# Patient Record
Sex: Female | Born: 1935 | Race: Black or African American | Hispanic: No | Smoking: Never smoker
Health system: Southern US, Community
[De-identification: ages and names within clinical notes are randomized; demographics above are authoritative.]

## PROBLEM LIST (undated history)

## (undated) DIAGNOSIS — I10 Essential (primary) hypertension: Secondary | ICD-10-CM

## (undated) DIAGNOSIS — K922 Gastrointestinal hemorrhage, unspecified: Secondary | ICD-10-CM

## (undated) DIAGNOSIS — G479 Sleep disorder, unspecified: Secondary | ICD-10-CM

## (undated) DIAGNOSIS — M25561 Pain in right knee: Secondary | ICD-10-CM

## (undated) DIAGNOSIS — M539 Dorsopathy, unspecified: Secondary | ICD-10-CM

## (undated) DIAGNOSIS — M25562 Pain in left knee: Secondary | ICD-10-CM

## (undated) DIAGNOSIS — Z87440 Personal history of urinary (tract) infections: Secondary | ICD-10-CM

## (undated) DIAGNOSIS — R519 Headache, unspecified: Secondary | ICD-10-CM

## (undated) DIAGNOSIS — R32 Unspecified urinary incontinence: Secondary | ICD-10-CM

## (undated) DIAGNOSIS — E669 Obesity, unspecified: Secondary | ICD-10-CM

## (undated) DIAGNOSIS — K5900 Constipation, unspecified: Secondary | ICD-10-CM

## (undated) DIAGNOSIS — D649 Anemia, unspecified: Secondary | ICD-10-CM

## (undated) DIAGNOSIS — M1990 Unspecified osteoarthritis, unspecified site: Secondary | ICD-10-CM

## (undated) DIAGNOSIS — G5600 Carpal tunnel syndrome, unspecified upper limb: Secondary | ICD-10-CM

## (undated) DIAGNOSIS — R270 Ataxia, unspecified: Secondary | ICD-10-CM

## (undated) DIAGNOSIS — A419 Sepsis, unspecified organism: Secondary | ICD-10-CM

## (undated) DIAGNOSIS — C50919 Malignant neoplasm of unspecified site of unspecified female breast: Secondary | ICD-10-CM

## (undated) DIAGNOSIS — R51 Headache: Secondary | ICD-10-CM

## (undated) DIAGNOSIS — K639 Disease of intestine, unspecified: Secondary | ICD-10-CM

## (undated) DIAGNOSIS — R0600 Dyspnea, unspecified: Secondary | ICD-10-CM

## (undated) HISTORY — PX: COLON SURGERY: SHX602

## (undated) HISTORY — DX: Gastrointestinal hemorrhage, unspecified: K92.2

## (undated) HISTORY — DX: Anemia, unspecified: D64.9

## (undated) HISTORY — DX: Dorsopathy, unspecified: M53.9

## (undated) HISTORY — DX: Headache: R51

## (undated) HISTORY — DX: Malignant neoplasm of unspecified site of unspecified female breast: C50.919

## (undated) HISTORY — DX: Sleep disorder, unspecified: G47.9

## (undated) HISTORY — DX: Pain in right knee: M25.561

## (undated) HISTORY — DX: Unspecified osteoarthritis, unspecified site: M19.90

## (undated) HISTORY — DX: Disease of intestine, unspecified: K63.9

## (undated) HISTORY — DX: Carpal tunnel syndrome, unspecified upper limb: G56.00

## (undated) HISTORY — PX: BREAST SURGERY: SHX581

## (undated) HISTORY — DX: Pain in right knee: M25.562

## (undated) HISTORY — PX: OTHER SURGICAL HISTORY: SHX169

## (undated) HISTORY — DX: Ataxia, unspecified: R27.0

## (undated) HISTORY — DX: Essential (primary) hypertension: I10

## (undated) HISTORY — DX: Obesity, unspecified: E66.9

## (undated) HISTORY — PX: EYE SURGERY: SHX253

## (undated) HISTORY — DX: Sepsis, unspecified organism: A41.9

## (undated) HISTORY — DX: Headache, unspecified: R51.9

---

## 1998-11-29 DIAGNOSIS — Z9289 Personal history of other medical treatment: Secondary | ICD-10-CM

## 1998-11-29 HISTORY — DX: Personal history of other medical treatment: Z92.89

## 2005-12-14 ENCOUNTER — Ambulatory Visit (HOSPITAL_BASED_OUTPATIENT_CLINIC_OR_DEPARTMENT_OTHER): Admission: RE | Admit: 2005-12-14 | Discharge: 2005-12-14 | Payer: Self-pay | Admitting: *Deleted

## 2005-12-19 ENCOUNTER — Ambulatory Visit: Payer: Self-pay | Admitting: Internal Medicine

## 2008-08-19 ENCOUNTER — Ambulatory Visit: Payer: Self-pay | Admitting: Oncology

## 2008-10-04 ENCOUNTER — Ambulatory Visit: Payer: Self-pay | Admitting: Oncology

## 2008-10-07 LAB — CBC WITH DIFFERENTIAL/PLATELET
BASO%: 0.4 % (ref 0.0–2.0)
Basophils Absolute: 0 10*3/uL (ref 0.0–0.1)
EOS%: 2.8 % (ref 0.0–7.0)
HGB: 11.5 g/dL — ABNORMAL LOW (ref 11.6–15.9)
MCH: 25.5 pg — ABNORMAL LOW (ref 26.0–34.0)
MCHC: 33 g/dL (ref 32.0–36.0)
MCV: 77.1 fL — ABNORMAL LOW (ref 81.0–101.0)
MONO%: 7.2 % (ref 0.0–13.0)
RDW: 16.4 % — ABNORMAL HIGH (ref 11.3–14.5)
lymph#: 1.5 10*3/uL (ref 0.9–3.3)

## 2008-10-07 LAB — COMPREHENSIVE METABOLIC PANEL
ALT: 21 U/L (ref 0–35)
AST: 28 U/L (ref 0–37)
Albumin: 3.7 g/dL (ref 3.5–5.2)
Alkaline Phosphatase: 75 U/L (ref 39–117)
BUN: 21 mg/dL (ref 6–23)
Calcium: 9.3 mg/dL (ref 8.4–10.5)
Chloride: 109 mEq/L (ref 96–112)
Creatinine, Ser: 1.19 mg/dL (ref 0.40–1.20)
Potassium: 4.6 mEq/L (ref 3.5–5.3)

## 2011-04-16 NOTE — Procedures (Signed)
NAMEDELVA, DERDEN                 ACCOUNT NO.:  [id number]   MEDICAL RECORD NO.:  [id number]          PATIENT TYPE:  OUT   LOCATION:  SLEEP CENTER                 FACILITY:  [facility]   PHYSICIAN:  Clinton D. Maple Hudson, M.D. DATE OF BIRTH:  1932-10-18   DATE OF STUDY:  12/14/2004                              NOCTURNAL POLYSOMNOGRAM   REFERRING PHYSICIAN:  Dr. Nelta Numbers.   INDICATIONS FOR STUDY:  Insomnia with sleep apnea. Sleep related movement  disorder.   EPWORTH SLEEPINESS SCORE:  06/24, BMI 50, Weight 302 pounds.   HOME MEDICATIONS:  Atenolol, Benicar, Piroxicam, sertraline, iron.   SLEEP ARCHITECTURE:  Total sleep time 348 minutes with sleep efficiency 82%.  Stage I was 11%, stage II 69%, stages III and IV were absent, REM 20% of  total sleep time. Sleep latency 36 minutes, REM latency 141 minutes, awake  after sleep onset 42 minutes, arousal index4. No bedtime medication taken.   RESPIRATORY DATA:  Apnea hypopnea index (AHI, RDI) 0.5 per hour which is  within normal limits (0 to 5 per hour) have reflected only 3 hypopneas. She  slept supine and on both sides.   OXYGEN DATA:  Mild to moderate snoring through the study with oxygen  desaturation to a nadir of 86%. Mean oxygen saturation through the study was  96% on room air.   CARDIAC DATA:  Sinus rhythm with occasional PACs.   MOVEMENT/ PARASOMNIA:  Of total of 105 limb jerks were recorded of which 4  were associated with arousal or awakening for a periodic limb movement with  arousal index of 0.7 per hour. Other movement or behavioral disorder was not  noted.   IMPRESSION:  1.  No significant sleep disordered breathing. Occasional hypopnea, AHI 0.5      per hour with mild to moderate snoring and oxygen desaturation to 86%.      Well preserved mean oxygen saturation of 96% on room air.  2.  There was adequate REM for evaluation without associated movement      disorder described. Otherwise history might be consistent with  REM      Behavior Disorder. Therapeutic trial with clonazepam at bedtime might be      considered. Frequent limb jerks were reported with little effect on      sleep. If these are more significant at times at home, they might lead      to some of the activity described. A therapeutic trial of Requip or      Mirapex could be      considered if appropriate. Further description of her nocturnal events      at home would be needed for better understanding.      Clinton D. Maple Hudson, M.D.  Diplomate, Biomedical engineer of Sleep Medicine  Electronically Signed     CDY/MEDQ  D:  12/19/2005 11:48:52  T:  12/20/2005 15:37:29  Job:  [id number]

## 2015-11-12 DIAGNOSIS — M1990 Unspecified osteoarthritis, unspecified site: Secondary | ICD-10-CM | POA: Insufficient documentation

## 2015-11-12 DIAGNOSIS — E669 Obesity, unspecified: Secondary | ICD-10-CM | POA: Insufficient documentation

## 2015-11-12 DIAGNOSIS — G5600 Carpal tunnel syndrome, unspecified upper limb: Secondary | ICD-10-CM | POA: Insufficient documentation

## 2015-11-12 DIAGNOSIS — C50919 Malignant neoplasm of unspecified site of unspecified female breast: Secondary | ICD-10-CM | POA: Insufficient documentation

## 2015-11-12 DIAGNOSIS — M25561 Pain in right knee: Secondary | ICD-10-CM | POA: Insufficient documentation

## 2015-11-12 DIAGNOSIS — G479 Sleep disorder, unspecified: Secondary | ICD-10-CM | POA: Insufficient documentation

## 2015-11-12 DIAGNOSIS — M539 Dorsopathy, unspecified: Secondary | ICD-10-CM | POA: Insufficient documentation

## 2015-11-12 DIAGNOSIS — M25562 Pain in left knee: Secondary | ICD-10-CM

## 2015-11-12 DIAGNOSIS — R270 Ataxia, unspecified: Secondary | ICD-10-CM | POA: Insufficient documentation

## 2015-11-12 DIAGNOSIS — K639 Disease of intestine, unspecified: Secondary | ICD-10-CM | POA: Insufficient documentation

## 2015-11-12 DIAGNOSIS — R51 Headache: Secondary | ICD-10-CM

## 2015-11-12 DIAGNOSIS — D649 Anemia, unspecified: Secondary | ICD-10-CM | POA: Insufficient documentation

## 2015-11-12 DIAGNOSIS — R519 Headache, unspecified: Secondary | ICD-10-CM | POA: Insufficient documentation

## 2015-11-12 DIAGNOSIS — I10 Essential (primary) hypertension: Secondary | ICD-10-CM | POA: Insufficient documentation

## 2015-11-14 ENCOUNTER — Ambulatory Visit: Payer: Medicare Other | Admitting: Interventional Cardiology

## 2015-12-18 ENCOUNTER — Encounter: Payer: Self-pay | Admitting: Interventional Cardiology

## 2015-12-31 ENCOUNTER — Ambulatory Visit (INDEPENDENT_AMBULATORY_CARE_PROVIDER_SITE_OTHER): Payer: Medicare Other | Admitting: Interventional Cardiology

## 2015-12-31 ENCOUNTER — Encounter: Payer: Self-pay | Admitting: Interventional Cardiology

## 2015-12-31 VITALS — BP 150/92 | HR 76 | Ht 65.0 in | Wt 229.1 lb

## 2015-12-31 DIAGNOSIS — Z87898 Personal history of other specified conditions: Secondary | ICD-10-CM

## 2015-12-31 DIAGNOSIS — R0789 Other chest pain: Secondary | ICD-10-CM | POA: Diagnosis not present

## 2015-12-31 DIAGNOSIS — Z8679 Personal history of other diseases of the circulatory system: Secondary | ICD-10-CM | POA: Diagnosis not present

## 2015-12-31 DIAGNOSIS — R55 Syncope and collapse: Secondary | ICD-10-CM | POA: Diagnosis not present

## 2015-12-31 DIAGNOSIS — I10 Essential (primary) hypertension: Secondary | ICD-10-CM

## 2015-12-31 DIAGNOSIS — I493 Ventricular premature depolarization: Secondary | ICD-10-CM

## 2015-12-31 NOTE — Patient Instructions (Signed)
Medication Instructions:  Your physician recommends that you continue on your current medications as directed. Please refer to the Current Medication list given to you today.  Labwork: None ordered.  Testing/Procedures: None ordered.  Follow-Up: Your physician recommends that you schedule a follow-up appointment as needed.   Any Other Special Instructions Will Be Listed Below (If Applicable).     If you need a refill on your cardiac medications before your next appointment, please call your pharmacy.   

## 2015-12-31 NOTE — Progress Notes (Signed)
Cardiology Office Note   Date:  12/31/2015   ID:  Tracy Faulkner, Tracy Faulkner 11/11/1932, MRN [id number]  PCP:  Pcp Not In System  Cardiologist:  Sinclair Grooms, MD   Chief Complaint  Patient presents with  . Palpitations  . Near Syncope  . Chest Pain      History of Present Illness: Tracy Faulkner is a 82 y.o. female who presents for near syncope, recurring chest pain, and dyspnea.  The patient exercises on a regular basis. She is orally obese. She has no exertional related symptoms. She has come in by her daughter and they are 3 concerns. The first concern is an episode in July 2016 where she nearly fainted while playing bingo. She felt sick, very weak, she couldn't hold her head up. She did not faint. There is no chest pain or palpitation. It eventually resolved. She went to the emergency room but no diagnosis was made.  The second is an episode of dyspnea and fatigue that occurred during a church service. Within 24 hours she went to an emergency room when she was held for several hours with testing performed but no diagnosis was made.  The third concern is recurring discomfort in the sub-xiphoid and subcostal region that radiates through to the back. When it starts sig last up to 3 days. It is not exertion related. Been no other associated symptoms. History is vague.    Past Medical History  Diagnosis Date  . Breast cancer ([place])   . Disturbance, sleep   . Arthritis   . Anemia   . Frequent headaches   . Back problem   . Bowel trouble   . Obesity   . Carpal tunnel syndrome   . Hypertension   . Ataxia   . Pain in both knees     Past Surgical History  Procedure Laterality Date  . Breast surgery      for cancer  . Gastric stapling       Current Outpatient Prescriptions  Medication Sig Dispense Refill  . acetaminophen (TYLENOL) 650 MG CR tablet Take 650 mg by mouth every 8 (eight) hours as needed for pain.    Marland Kitchen amLODipine (NORVASC) 10 MG tablet Take 10 mg by mouth  daily.    Marland Kitchen aspirin 325 MG tablet Take 325 mg by mouth daily.    . butalbital-acetaminophen-caffeine (FIORICET, ESGIC) 50-325-40 MG tablet Take 1 tablet by mouth every 6 (six) hours as needed for headache.    . Cholecalciferol (VITAMIN D3) 10000 units TABS Take 1,000 Units by mouth daily.    . CycloSPORINE (RESTASIS OP) Place 1 drop into both eyes 2 (two) times daily.    . DUREZOL 0.05 % EMUL Place 1 drop into the left eye 2 (two) times daily.    Marland Kitchen gentamicin (GARAMYCIN) 0.3 % ophthalmic solution Place 1 drop into the left eye.    Marland Kitchen ibuprofen (ADVIL,MOTRIN) 200 MG tablet Take 200 mg by mouth every 6 (six) hours as needed for moderate pain.    Marland Kitchen ILEVRO 0.3 % ophthalmic suspension Place 1 drop into the left eye daily.    . mirtazapine (REMERON) 15 MG tablet Take 15 mg by mouth at bedtime.    . Omega-3 Fatty Acids (FISH OIL PO) Take 2 capsules by mouth daily.    Marland Kitchen OVER THE COUNTER MEDICATION Take 1 tablet by mouth daily. Med Name: HAIR VITAMINS    . piroxicam (FELDENE) 20 MG capsule Take 20 mg by mouth daily.    Marland Kitchen  valsartan-hydrochlorothiazide (DIOVAN-HCT) 160-12.5 MG tablet Take 1 tablet by mouth daily.     No current facility-administered medications for this visit.    Allergies:   Ace inhibitors; Lortab; and Penicillins    Social History:  The patient  reports that she has never smoked. She has never used smokeless tobacco. She reports that she does not drink alcohol.   Family History:  The patient's family history includes Arthritis in her mother; Cancer in her mother; Diabetes Mellitus II in her mother; Hypertension in her mother; Kidney disease in her mother.    ROS:  Please see the history of present illness.   Otherwise, review of systems are positive for back discomfort, fatigue, anxiety, and decreased memory..   All other systems are reviewed and negative.    PHYSICAL EXAM: VS:  BP 150/92 mmHg  Pulse 76  Ht 5\' 5"  (1.651 m)  Wt 229 lb 1.9 oz (103.928 kg)  BMI 38.13 kg/m2   SpO2 98% , BMI Body mass index is 38.13 kg/(m^2). GEN: Well nourished, well developed, in no acute distress HEENT: normal Neck: no JVD, carotid bruits, or masses Cardiac: RRR.  There isno murmur, rub, or gallop. There is no edema. Respiratory:  clear to auscultation bilaterally, normal work of breathing. GI: soft, nontender, nondistended, + BS MS: no deformity or atrophy Skin: warm and dry, no rash Neuro:  Strength and sensation are intact Psych: euthymic mood, full affect   EKG:  EKG is performed today.  The ekg reveals  normal sinus rhythm, PVCs with fixed coupling interval    Recent Labs: No results found for requested labs within last 365 days.    Lipid Panel No results found for: CHOL, TRIG, HDL, CHOLHDL, VLDL, LDLCALC, LDLDIRECT    Wt Readings from Last 3 Encounters:  12/31/15 229 lb 1.9 oz (103.928 kg)      Other studies Reviewed: Additional studies/ records that were reviewed today include: Records from primary physician.. The findings include Echocardiogram that is essentially normal with the exception of mild concentric hypertrophy and mild diastolic dysfunction. BNP 107. Laboratory data unremarkable.    ASSESSMENT AND PLAN:  .1. History of palpitations History is vague but this seems to be a complaint. - EKG 12-Lead  2. Essential hypertension Well controlled. - EKG 12-Lead  3. Near syncope Has not recurred since July 2016  4. Chest discomfort Has occurred intermittently over the past 2-3 years. When it develops a can last up to 3 days before resolving.  5. Premature ventricular contractions Identified on 12-lead EKG and asymptomatic.    Current medicines are reviewed at length with the patient today.  The patient has the following concerns regarding medicines: None .  The following changes/actions have been instituted:    Pharmacologic myocardial perfusion study to exclude coronary artery disease/ischemia as the source of chest  discomfort.  Probably needs prolonged monitoring to exclude the possibility of arrhythmia.  After discussion with both the patient and daughter, they chose continued observation.  No follow-up will be arranged unless additional problems occur.   Labs/ tests ordered today include:   Orders Placed This Encounter  Procedures  . EKG 12-Lead     Disposition:   FU with HS in PRN depending on workup.    Signed, Sinclair Grooms, MD  12/31/2015 6:00 PM    [address], [place], [place]  [address] Phone: [phone]; Fax: [phone]

## 2016-08-20 ENCOUNTER — Ambulatory Visit: Payer: Self-pay | Admitting: Orthopedic Surgery

## 2016-08-20 NOTE — Progress Notes (Signed)
Scheduling for pre op-  Please place PRE OP ORDERS IN EPIC---thanks

## 2016-09-01 ENCOUNTER — Encounter (HOSPITAL_COMMUNITY): Payer: Medicare Other

## 2016-09-02 ENCOUNTER — Inpatient Hospital Stay (HOSPITAL_COMMUNITY)
Admission: RE | Admit: 2016-09-02 | Discharge: 2016-09-02 | Disposition: A | Discharge status: Home / Self Care | Payer: Medicare Other | Source: Ambulatory Visit | Attending: Orthopedic Surgery | Admitting: Orthopedic Surgery

## 2016-09-02 NOTE — Progress Notes (Signed)
09-02-16 1150 Pt. Not shown for PAT appointment, reached by home phone number. Pt states having chest pain and some heart issues, was told by Dr. Sid Falcon office  Yesterday ,could not do surgery until these issues were resolved or checked into. Pt. States she had asked daughter to call us to cancel PAT appointment. I reminded pt to check in with Dr. Sid Falcon office for further updates about surgery.

## 2016-09-03 ENCOUNTER — Telehealth: Payer: Self-pay | Admitting: Interventional Cardiology

## 2016-09-03 NOTE — Telephone Encounter (Signed)
Request for surgical clearance:  1. What type of surgery is being performed? Lt knee TKA, CAS   2. When is this surgery scheduled? 09/09/16   3. Are there any medications that need to be held prior to surgery and how long? ASA  4. Name of physician performing surgery?  Dr. Lyla Glassing  5. What is your office phone and fax number? Fax [place]  Phone [phone]

## 2016-09-03 NOTE — Telephone Encounter (Signed)
Cleared for the upcoming procedure.Okay to hold aspirin.

## 2016-09-03 NOTE — Telephone Encounter (Signed)
Spoke with daughter and she states that pt occasionally has SOB and still has the palps occasionally.  Denies much change since pt last seen but does state she has declined slightly.  Pt does work out at [employer] four days a week.  Denies other cardiac symptoms.

## 2016-09-03 NOTE — Telephone Encounter (Signed)
Follow up   pt daughter verbalized that they are returning call for rn

## 2016-09-06 NOTE — Telephone Encounter (Signed)
Clearance faxed to [place].

## 2016-09-07 ENCOUNTER — Encounter (HOSPITAL_COMMUNITY): Payer: Self-pay

## 2016-09-07 ENCOUNTER — Encounter (HOSPITAL_COMMUNITY)
Admission: RE | Admit: 2016-09-07 | Discharge: 2016-09-07 | Disposition: A | Discharge status: Home / Self Care | Payer: Medicare Other | Source: Ambulatory Visit | Attending: Orthopedic Surgery | Admitting: Orthopedic Surgery

## 2016-09-07 DIAGNOSIS — Z880 Allergy status to penicillin: Secondary | ICD-10-CM | POA: Diagnosis not present

## 2016-09-07 DIAGNOSIS — Z01812 Encounter for preprocedural laboratory examination: Secondary | ICD-10-CM | POA: Diagnosis present

## 2016-09-07 DIAGNOSIS — M1712 Unilateral primary osteoarthritis, left knee: Secondary | ICD-10-CM | POA: Diagnosis not present

## 2016-09-07 DIAGNOSIS — Z0183 Encounter for blood typing: Secondary | ICD-10-CM | POA: Diagnosis not present

## 2016-09-07 DIAGNOSIS — R51 Headache: Secondary | ICD-10-CM | POA: Diagnosis not present

## 2016-09-07 DIAGNOSIS — Z853 Personal history of malignant neoplasm of breast: Secondary | ICD-10-CM | POA: Diagnosis not present

## 2016-09-07 DIAGNOSIS — E669 Obesity, unspecified: Secondary | ICD-10-CM | POA: Diagnosis not present

## 2016-09-07 DIAGNOSIS — R42 Dizziness and giddiness: Secondary | ICD-10-CM | POA: Diagnosis not present

## 2016-09-07 DIAGNOSIS — Z888 Allergy status to other drugs, medicaments and biological substances status: Secondary | ICD-10-CM | POA: Diagnosis not present

## 2016-09-07 DIAGNOSIS — M549 Dorsalgia, unspecified: Secondary | ICD-10-CM | POA: Diagnosis not present

## 2016-09-07 DIAGNOSIS — I10 Essential (primary) hypertension: Secondary | ICD-10-CM | POA: Diagnosis not present

## 2016-09-07 DIAGNOSIS — R251 Tremor, unspecified: Secondary | ICD-10-CM | POA: Diagnosis not present

## 2016-09-07 DIAGNOSIS — Z6835 Body mass index (BMI) 35.0-35.9, adult: Secondary | ICD-10-CM | POA: Diagnosis not present

## 2016-09-07 LAB — BASIC METABOLIC PANEL
ANION GAP: 8 (ref 5–15)
BUN: 33 mg/dL — ABNORMAL HIGH (ref 6–20)
CALCIUM: 9.6 mg/dL (ref 8.9–10.3)
CO2: 25 mmol/L (ref 22–32)
Chloride: 107 mmol/L (ref 101–111)
Creatinine, Ser: 1.46 mg/dL — ABNORMAL HIGH (ref 0.44–1.00)
GFR, EST AFRICAN AMERICAN: 37 mL/min — AB (ref >60)
GFR, EST NON AFRICAN AMERICAN: 32 mL/min — AB (ref >60)
Glucose, Bld: 112 mg/dL — ABNORMAL HIGH (ref 65–99)
POTASSIUM: 4.2 mmol/L (ref 3.5–5.1)
Sodium: 140 mmol/L (ref 135–145)

## 2016-09-07 LAB — URINALYSIS, ROUTINE W REFLEX MICROSCOPIC
GLUCOSE, UA: NEGATIVE mg/dL
HGB URINE DIPSTICK: NEGATIVE
Ketones, ur: NEGATIVE mg/dL
Nitrite: NEGATIVE
PH: 5.5 (ref 5.0–8.0)
PROTEIN: NEGATIVE mg/dL
Specific Gravity, Urine: 1.024 (ref 1.005–1.030)

## 2016-09-07 LAB — ABO/RH: ABO/RH(D): O POS

## 2016-09-07 LAB — URINE MICROSCOPIC-ADD ON

## 2016-09-07 LAB — CBC
HEMATOCRIT: 31.8 % — AB (ref 36.0–46.0)
HEMOGLOBIN: 10.6 g/dL — AB (ref 12.0–15.0)
MCH: 26.2 pg (ref 26.0–34.0)
MCHC: 33.3 g/dL (ref 30.0–36.0)
MCV: 78.7 fL (ref 78.0–100.0)
Platelets: 168 10*3/uL (ref 150–400)
RBC: 4.04 MIL/uL (ref 3.87–5.11)
RDW: 15.4 % (ref 11.5–15.5)
WBC: 5.8 10*3/uL (ref 4.0–10.5)

## 2016-09-07 LAB — SURGICAL PCR SCREEN
MRSA, PCR: NEGATIVE
STAPHYLOCOCCUS AUREUS: NEGATIVE

## 2016-09-07 NOTE — Patient Instructions (Addendum)
Tracy Faulkner  09/07/2016   Your procedure is scheduled on: Thursday 09/09/2016  Report to [facility] Main  Entrance take [facility]  elevators to 3rd floor to  [place] at 1045 AM.  Call this number if you have problems the morning of surgery [phone]   Remember: ONLY 1 PERSON MAY GO WITH YOU TO SHORT STAY TO GET  READY MORNING OF [place].  Do not eat food or drink liquids :After Midnight.     Take these medicines the morning of surgery with A SIP OF WATER: use Restasis eye drops                                 You may not have any metal on your body including hair pins and              piercings  Do not wear jewelry, make-up, lotions, powders or perfumes, deodorant             Do not wear nail polish.  Do not shave  48 hours prior to surgery.              Men may shave face and neck.   Do not bring valuables to the hospital. [place].  Contacts, dentures or bridgework may not be worn into surgery.  Leave suitcase in the car. After surgery it may be brought to your room.                  Please read over the following fact sheets you were given: _____________________________________________________________________             [facility] - Preparing for Surgery Before surgery, you can play an important role.  Because skin is not sterile, your skin needs to be as free of germs as possible.  You can reduce the number of germs on your skin by washing with CHG (chlorahexidine gluconate) soap before surgery.  CHG is an antiseptic cleaner which kills germs and bonds with the skin to continue killing germs even after washing. Please DO NOT use if you have an allergy to CHG or antibacterial soaps.  If your skin becomes reddened/irritated stop using the CHG and inform your nurse when you arrive at Short Stay. Do not shave (including legs and underarms) for at least 48 hours prior to the first CHG  shower.  You may shave your face/neck. Please follow these instructions carefully:  1.  Shower with CHG Soap the night before surgery and the  morning of Surgery.  2.  If you choose to wash your hair, wash your hair first as usual with your  normal  shampoo.  3.  After you shampoo, rinse your hair and body thoroughly to remove the  shampoo.                           4.  Use CHG as you would any other liquid soap.  You can apply chg directly  to the skin and wash                       Gently with a scrungie or clean washcloth.  5.  Apply the CHG Soap to your body ONLY FROM THE NECK DOWN.   Do not use on face/ open                           Wound or open sores. Avoid contact with eyes, ears mouth and genitals (private parts).                       Wash face,  Genitals (private parts) with your normal soap.             6.  Wash thoroughly, paying special attention to the area where your surgery  will be performed.  7.  Thoroughly rinse your body with warm water from the neck down.  8.  DO NOT shower/wash with your normal soap after using and rinsing off  the CHG Soap.                9.  Pat yourself dry with a clean towel.            10.  Wear clean pajamas.            11.  Place clean sheets on your bed the night of your first shower and do not  sleep with pets. Day of Surgery : Do not apply any lotions/deodorants the morning of surgery.  Please wear clean clothes to the hospital/surgery center.  FAILURE TO FOLLOW THESE INSTRUCTIONS MAY RESULT IN THE CANCELLATION OF YOUR SURGERY PATIENT SIGNATURE_________________________________  NURSE SIGNATURE__________________________________  ________________________________________________________________________   Adam Phenix  An incentive spirometer is a tool that can help keep your lungs clear and active. This tool measures how well you are filling your lungs with each breath. Taking long deep breaths may help reverse or decrease the chance  of developing breathing (pulmonary) problems (especially infection) following:  A long period of time when you are unable to move or be active. BEFORE THE PROCEDURE   If the spirometer includes an indicator to show your best effort, your nurse or respiratory therapist will set it to a desired goal.  If possible, sit up straight or lean slightly forward. Try not to slouch.  Hold the incentive spirometer in an upright position. INSTRUCTIONS FOR USE  1. Sit on the edge of your bed if possible, or sit up as far as you can in bed or on a chair. 2. Hold the incentive spirometer in an upright position. 3. Breathe out normally. 4. Place the mouthpiece in your mouth and seal your lips tightly around it. 5. Breathe in slowly and as deeply as possible, raising the piston or the ball toward the top of the column. 6. Hold your breath for 3-5 seconds or for as long as possible. Allow the piston or ball to fall to the bottom of the column. 7. Remove the mouthpiece from your mouth and breathe out normally. 8. Rest for a few seconds and repeat Steps 1 through 7 at least 10 times every 1-2 hours when you are awake. Take your time and take a few normal breaths between deep breaths. 9. The spirometer may include an indicator to show your best effort. Use the indicator as a goal to work toward during each repetition. 10. After each set of 10 deep breaths, practice coughing to be sure your lungs are clear. If you have an incision (the cut made at the time of surgery), support your incision when coughing by placing a pillow or  rolled up towels firmly against it. Once you are able to get out of bed, walk around indoors and cough well. You may stop using the incentive spirometer when instructed by your caregiver.  RISKS AND COMPLICATIONS  Take your time so you do not get dizzy or light-headed.  If you are in pain, you may need to take or ask for pain medication before doing incentive spirometry. It is harder to  take a deep breath if you are having pain. AFTER USE  Rest and breathe slowly and easily.  It can be helpful to keep track of a log of your progress. Your caregiver can provide you with a simple table to help with this. If you are using the spirometer at home, follow these instructions: [place] IF:   You are having difficultly using the spirometer.  You have trouble using the spirometer as often as instructed.  Your pain medication is not giving enough relief while using the spirometer.  You develop fever of 100.5 F (38.1 C) or higher. SEEK IMMEDIATE MEDICAL CARE IF:   You cough up bloody sputum that had not been present before.  You develop fever of 102 F (38.9 C) or greater.  You develop worsening pain at or near the incision site. MAKE SURE YOU:   Understand these instructions.  Will watch your condition.  Will get help right away if you are not doing well or get worse. Document Released: 03/28/2007 Document Revised: 02/07/2012 Document Reviewed: 05/29/2007 [facility] Patient Information 2014 [place], [place].   ________________________________________________________________________

## 2016-09-07 NOTE — Progress Notes (Signed)
09/03/2016- Pre-operative clearance from Dr. Daneen Schick on chart. 03/09/2016-Pre-operative clearance from Dr. Daron Offer on chart. 07/15/2016-Pre-operative clearance from Dr. Blaine Hamper ,DDS on chart.

## 2016-09-08 ENCOUNTER — Ambulatory Visit: Payer: Self-pay | Admitting: Orthopedic Surgery

## 2016-09-08 NOTE — H&P (Deleted)
  The note originally documented on this encounter has been moved the the encounter in which it belongs.  

## 2016-09-08 NOTE — H&P (Signed)
TOTAL KNEE ADMISSION H&P  Patient is being admitted for left total knee arthroplasty.  Subjective:  Chief Complaint:left knee pain.  HPI: Tracy Faulkner, 82 y.o. female, has a history of pain and functional disability in the left knee due to arthritis and has failed non-surgical conservative treatments for greater than 12 weeks to includeNSAID's and/or analgesics, corticosteriod injections, flexibility and strengthening excercises, use of assistive devices, weight reduction as appropriate and activity modification.  Onset of symptoms was gradual, starting >10 years ago with gradually worsening course since that time. The patient noted no past surgery on the left knee(s).  Patient currently rates pain in the left knee(s) at 10 out of 10 with activity. Patient has night pain, worsening of pain with activity and weight bearing, pain that interferes with activities of daily living, pain with passive range of motion, crepitus and joint swelling.  Patient has evidence of subchondral cysts, subchondral sclerosis, periarticular osteophytes, joint subluxation and joint space narrowing by imaging studies.  There is no active infection.  Patient Active Problem List   Diagnosis Date Noted  . Near syncope 12/31/2015  . Chest discomfort 12/31/2015  . Premature ventricular contractions 12/31/2015  . Breast cancer ([place])   . Disturbance, sleep   . Arthritis   . Anemia   . Frequent headaches   . Back problem   . Bowel trouble   . Obesity   . Carpal tunnel syndrome   . Ataxia   . Hypertension   . Pain in both knees    Past Medical History:  Diagnosis Date  . Anemia   . Arthritis   . Ataxia   . Back problem   . Bowel trouble   . Breast cancer ([place])   . Carpal tunnel syndrome   . Disturbance, sleep   . Frequent headaches   . Hypertension   . Obesity   . Pain in both knees     Past Surgical History:  Procedure Laterality Date  . BREAST SURGERY     for cancer-right breast  . gastric stapling        (Not in a hospital admission) Allergies  Allergen Reactions  . Ace Inhibitors Cough  . Lortab [Hydrocodone-Acetaminophen]     Bad dreams  . Remeron [Mirtazapine] Other (See Comments)    Hallucinations (can take 1/2 tablet--7.5 mg), not 15 mg  . Penicillins Rash and Other (See Comments)    Throat swelling, Has patient had a PCN reaction causing immediate rash, facial/tongue/throat swelling, SOB or lightheadedness with hypotension: Yes Has patient had a PCN reaction causing severe rash involving mucus membranes or skin necrosis: No Has patient had a PCN reaction that required hospitalization No Has patient had a PCN reaction occurring within the last 10 years: No If all of the above answers are "NO", then may proceed with Cephalosporin use.     Social History  Substance Use Topics  . Smoking status: Never Smoker  . Smokeless tobacco: Never Used  . Alcohol use No    Family History  Problem Relation Age of Onset  . Arthritis Mother   . Cancer Mother   . Diabetes Mellitus II Mother   . Hypertension Mother   . Kidney disease Mother      Review of Systems  Constitutional: Positive for diaphoresis.  Gastrointestinal: Positive for heartburn.  Musculoskeletal: Positive for back pain and joint pain.  Skin: Negative.   Neurological: Positive for dizziness, tremors and headaches.  Psychiatric/Behavioral: Negative.     Objective:  Physical Exam  Constitutional: She is oriented to person, place, and time. She appears well-developed and well-nourished.  HENT:  Head: Normocephalic and atraumatic.  Eyes: Conjunctivae and EOM are normal. Pupils are equal, round, and reactive to light.  Neck: Normal range of motion. Neck supple.  Cardiovascular: Normal rate, regular rhythm and intact distal pulses.   Respiratory: Effort normal and breath sounds normal.  GI: Soft. She exhibits no distension.  Genitourinary:  Genitourinary Comments: deferred  Musculoskeletal:       Left knee:  She exhibits decreased range of motion and abnormal alignment. Tenderness found. Medial joint line and lateral joint line tenderness noted.  Neurological: She is alert and oriented to person, place, and time. She has normal reflexes.  Skin: Skin is warm and dry.  Psychiatric: She has a normal mood and affect. Her behavior is normal. Judgment and thought content normal.    Vital signs in last 24 hours: @VSRANGES @  Labs:   Estimated body mass index is 35.49 kg/m as calculated from the following:   Height as of 09/07/16: 5' 4.5" (1.638 m).   Weight as of 09/07/16: 95.3 kg (210 lb).   Imaging Review Plain radiographs demonstrate severe degenerative joint disease of the left knee(s). The overall alignment issignificant varus. The bone quality appears to be adequate for age and reported activity level.  Assessment/Plan:  End stage arthritis, left knee   The patient history, physical examination, clinical judgment of the provider and imaging studies are consistent with end stage degenerative joint disease of the left knee(s) and total knee arthroplasty is deemed medically necessary. The treatment options including medical management, injection therapy arthroscopy and arthroplasty were discussed at length. The risks and benefits of total knee arthroplasty were presented and reviewed. The risks due to aseptic loosening, infection, stiffness, patella tracking problems, thromboembolic complications and other imponderables were discussed. The patient acknowledged the explanation, agreed to proceed with the plan and consent was signed. Patient is being admitted for inpatient treatment for surgery, pain control, PT, OT, prophylactic antibiotics, VTE prophylaxis, progressive ambulation and ADL's and discharge planning. The patient is planning to be discharged home with home health services

## 2016-09-09 ENCOUNTER — Encounter (HOSPITAL_COMMUNITY): Payer: Self-pay | Admitting: Certified Registered Nurse Anesthetist

## 2016-09-09 ENCOUNTER — Encounter (HOSPITAL_COMMUNITY): Payer: Self-pay | Admitting: *Deleted

## 2016-09-09 ENCOUNTER — Ambulatory Visit (HOSPITAL_COMMUNITY)
Admission: RE | Admit: 2016-09-09 | Discharge: 2016-09-09 | Disposition: A | Discharge status: Home / Self Care | Payer: Medicare Other | Source: Ambulatory Visit | Attending: Orthopedic Surgery | Admitting: Orthopedic Surgery

## 2016-09-09 ENCOUNTER — Encounter (HOSPITAL_COMMUNITY)
Admission: RE | Disposition: A | Discharge status: Home / Self Care | Payer: Self-pay | Source: Ambulatory Visit | Attending: Orthopedic Surgery

## 2016-09-09 DIAGNOSIS — Z01812 Encounter for preprocedural laboratory examination: Secondary | ICD-10-CM | POA: Diagnosis not present

## 2016-09-09 DIAGNOSIS — Z6835 Body mass index (BMI) 35.0-35.9, adult: Secondary | ICD-10-CM | POA: Insufficient documentation

## 2016-09-09 DIAGNOSIS — M549 Dorsalgia, unspecified: Secondary | ICD-10-CM | POA: Insufficient documentation

## 2016-09-09 DIAGNOSIS — M1712 Unilateral primary osteoarthritis, left knee: Secondary | ICD-10-CM | POA: Insufficient documentation

## 2016-09-09 DIAGNOSIS — Z888 Allergy status to other drugs, medicaments and biological substances status: Secondary | ICD-10-CM | POA: Insufficient documentation

## 2016-09-09 DIAGNOSIS — E669 Obesity, unspecified: Secondary | ICD-10-CM | POA: Insufficient documentation

## 2016-09-09 DIAGNOSIS — I10 Essential (primary) hypertension: Secondary | ICD-10-CM | POA: Insufficient documentation

## 2016-09-09 DIAGNOSIS — Z0183 Encounter for blood typing: Secondary | ICD-10-CM | POA: Insufficient documentation

## 2016-09-09 DIAGNOSIS — R51 Headache: Secondary | ICD-10-CM | POA: Insufficient documentation

## 2016-09-09 DIAGNOSIS — R251 Tremor, unspecified: Secondary | ICD-10-CM | POA: Insufficient documentation

## 2016-09-09 DIAGNOSIS — Z880 Allergy status to penicillin: Secondary | ICD-10-CM | POA: Insufficient documentation

## 2016-09-09 DIAGNOSIS — R42 Dizziness and giddiness: Secondary | ICD-10-CM | POA: Insufficient documentation

## 2016-09-09 DIAGNOSIS — Z853 Personal history of malignant neoplasm of breast: Secondary | ICD-10-CM | POA: Insufficient documentation

## 2016-09-09 LAB — TYPE AND SCREEN
ABO/RH(D): O POS
ANTIBODY SCREEN: NEGATIVE

## 2016-09-09 SURGERY — ARTHROPLASTY, KNEE, TOTAL, USING IMAGELESS COMPUTER-ASSISTED NAVIGATION
Anesthesia: Spinal | Laterality: Left

## 2016-09-09 MED ORDER — SULFAMETHOXAZOLE-TRIMETHOPRIM 800-160 MG PO TABS: 1.0000 | ORAL_TABLET | Freq: Two times a day (BID) | ORAL | 0 refills | Status: DC

## 2016-09-09 MED ORDER — SODIUM CHLORIDE 0.9 % IV SOLN: INTRAVENOUS | Status: DC

## 2016-09-09 MED ORDER — TRANEXAMIC ACID 1000 MG/10ML IV SOLN
1000.0000 mg | INTRAVENOUS | Status: DC
  Filled 2016-09-09: qty 10

## 2016-09-09 MED ORDER — ACETAMINOPHEN 10 MG/ML IV SOLN
1000.0000 mg | INTRAVENOUS | Status: DC
  Filled 2016-09-09: qty 100

## 2016-09-09 MED ORDER — CHLORHEXIDINE GLUCONATE 4 % EX LIQD: 60.0000 mL | Freq: Once | CUTANEOUS | Status: DC

## 2016-09-09 MED ORDER — CEFAZOLIN SODIUM-DEXTROSE 2-4 GM/100ML-% IV SOLN
2.0000 g | INTRAVENOUS | Status: DC
  Filled 2016-09-09: qty 100

## 2016-09-09 NOTE — Progress Notes (Signed)
Pt. Seen in HR for preop preparation for surgery today.  Lab results reviewed .  Urinalysis indicates present UTI.  Pt. Voices symptoms of slight burning on uriation, nightime frequency, and feeling that bladder has not completely emptied.  Lab rsults and symptoms reported to Dr. Lyla Glassing.  Clean catch urine obtained for repeat culture.  Surgery cancelled.  Pt. Transferred to Short Stay to prepare for discharge.  Antibiotics will be prescribed and office will call pt. To reschedule surgery.

## 2016-09-09 NOTE — Anesthesia Preprocedure Evaluation (Deleted)
Anesthesia Evaluation  Patient identified by MRN, date of birth, ID band Patient awake    Reviewed: Allergy & Precautions, H&P , NPO status , Patient's Chart, lab work & pertinent test results  Airway Mallampati: II   Neck ROM: full    Dental   Pulmonary           Cardiovascular hypertension,      Neuro/Psych  Headaches,    GI/Hepatic   Endo/Other  obese  Renal/GU      Musculoskeletal  (+) Arthritis ,   Abdominal   Peds  Hematology   Anesthesia Other Findings   Reproductive/Obstetrics                             Anesthesia Physical Anesthesia Plan  ASA: III  Anesthesia Plan: MAC and Spinal   Post-op Pain Management:    Induction: Intravenous  Airway Management Planned: Simple Face Mask  Additional Equipment:   Intra-op Plan:   Post-operative Plan:   Informed Consent: I have reviewed the patients History and Physical, chart, labs and discussed the procedure including the risks, benefits and alternatives for the proposed anesthesia with the patient or authorized representative who has indicated his/her understanding and acceptance.     Plan Discussed with: CRNA, Anesthesiologist and Surgeon  Anesthesia Plan Comments:         Anesthesia Quick Evaluation

## 2016-09-10 LAB — URINE CULTURE

## 2016-09-29 ENCOUNTER — Ambulatory Visit: Payer: Self-pay | Admitting: Orthopedic Surgery

## 2016-09-29 NOTE — Progress Notes (Signed)
Pt being scheduled for preop appt; please place surgical orders in epic. Thanks.

## 2016-10-01 ENCOUNTER — Encounter (HOSPITAL_COMMUNITY)
Admission: RE | Admit: 2016-10-01 | Discharge: 2016-10-01 | Disposition: A | Discharge status: Home / Self Care | Payer: Medicare Other | Source: Ambulatory Visit | Attending: Orthopedic Surgery | Admitting: Orthopedic Surgery

## 2016-10-01 ENCOUNTER — Encounter (HOSPITAL_COMMUNITY): Payer: Self-pay

## 2016-10-01 DIAGNOSIS — Z01812 Encounter for preprocedural laboratory examination: Secondary | ICD-10-CM | POA: Insufficient documentation

## 2016-10-01 HISTORY — DX: Dyspnea, unspecified: R06.00

## 2016-10-01 HISTORY — DX: Constipation, unspecified: K59.00

## 2016-10-01 HISTORY — DX: Unspecified urinary incontinence: R32

## 2016-10-01 HISTORY — DX: Personal history of urinary (tract) infections: Z87.440

## 2016-10-01 LAB — CBC
HCT: 31.7 % — ABNORMAL LOW (ref 36.0–46.0)
Hemoglobin: 10.3 g/dL — ABNORMAL LOW (ref 12.0–15.0)
MCH: 26 pg (ref 26.0–34.0)
MCHC: 32.5 g/dL (ref 30.0–36.0)
MCV: 80.1 fL (ref 78.0–100.0)
PLATELETS: 168 10*3/uL (ref 150–400)
RBC: 3.96 MIL/uL (ref 3.87–5.11)
RDW: 15.4 % (ref 11.5–15.5)
WBC: 6.3 10*3/uL (ref 4.0–10.5)

## 2016-10-01 LAB — BASIC METABOLIC PANEL
ANION GAP: 6 (ref 5–15)
BUN: 30 mg/dL — ABNORMAL HIGH (ref 6–20)
CALCIUM: 9.3 mg/dL (ref 8.9–10.3)
CHLORIDE: 111 mmol/L (ref 101–111)
CO2: 25 mmol/L (ref 22–32)
CREATININE: 1.28 mg/dL — AB (ref 0.44–1.00)
GFR calc non Af Amer: 38 mL/min — ABNORMAL LOW (ref >60)
GFR, EST AFRICAN AMERICAN: 44 mL/min — AB (ref >60)
Glucose, Bld: 92 mg/dL (ref 65–99)
Potassium: 4.2 mmol/L (ref 3.5–5.1)
SODIUM: 142 mmol/L (ref 135–145)

## 2016-10-01 LAB — SURGICAL PCR SCREEN
MRSA, PCR: NEGATIVE
Staphylococcus aureus: NEGATIVE

## 2016-10-01 NOTE — Progress Notes (Signed)
BMP results in epic per PAT visit 10/01/2016 sent to Dr Lyla Glassing

## 2016-10-01 NOTE — Patient Instructions (Addendum)
Tracy Faulkner  10/01/2016   Your procedure is scheduled on: Thursday October 07, 2016  Report to [facility]  Entrance take [place]  elevators to 3rd floor to  [name] at 1:45 PM.  Call this number if you have problems the morning of surgery [phone]   Remember: ONLY 1 PERSON MAY GO WITH YOU TO SHORT STAY TO GET  READY MORNING OF [name].  Do not eat food After Midnight but may take clear liquids till 9:45 am day of surgery then nothing by mouth.      Take these medicines the morning of surgery: Eye Drops if needed                                 You may not have any metal on your body including hair pins and              piercings  Do not wear jewelry, make-up, lotions, powders or perfumes, deodorant             Do not wear nail polish.  Do not shave  48 hours prior to surgery.              Do not bring valuables to the hospital. Tracy Faulkner.  Contacts, dentures or bridgework may not be worn into surgery.  Leave suitcase in the car. After surgery it may be brought to your room.              Please read over the following fact sheets you were given:MRSA INFORMATION SHEET; INCENTIVE SPIROMETER; BLOOD TRANSFUSION INFORMATION SHEET  _____________________________________________________________________   [facility] - Preparing for Surgery Before surgery, you can play an important role.  Because skin is not sterile, your skin needs to be as free of germs as possible.  You can reduce the number of germs on your skin by washing with CHG (chlorahexidine gluconate) soap before surgery.  CHG is an antiseptic cleaner which kills germs and bonds with the skin to continue killing germs even after washing. Please DO NOT use if you have an allergy to CHG or antibacterial soaps.  If your skin becomes reddened/irritated stop using the CHG and inform your nurse when you arrive at Short Stay. Do not shave  (including legs and underarms) for at least 48 hours prior to the first CHG shower.  You may shave your face/neck. Please follow these instructions carefully:  1.  Shower with CHG Soap the night before surgery and the  morning of Surgery.  2.  If you choose to wash your hair, wash your hair first as usual with your  normal  shampoo.  3.  After you shampoo, rinse your hair and body thoroughly to remove the  shampoo.                           4.  Use CHG as you would any other liquid soap.  You can apply chg directly  to the skin and wash                       Gently with a scrungie or clean washcloth.  5.  Apply the  CHG Soap to your body ONLY FROM THE NECK DOWN.   Do not use on face/ open                           Wound or open sores. Avoid contact with eyes, ears mouth and genitals (private parts).                       Wash face,  Genitals (private parts) with your normal soap.             6.  Wash thoroughly, paying special attention to the area where your surgery  will be performed.  7.  Thoroughly rinse your body with warm water from the neck down.  8.  DO NOT shower/wash with your normal soap after using and rinsing off  the CHG Soap.                9.  Pat yourself dry with a clean towel.            10.  Wear clean pajamas.            11.  Place clean sheets on your bed the night of your first shower and do not  sleep with pets. Day of Surgery : Do not apply any lotions/deodorants the morning of surgery.  Please wear clean clothes to the hospital/surgery center.  FAILURE TO FOLLOW THESE INSTRUCTIONS MAY RESULT IN THE CANCELLATION OF YOUR SURGERY PATIENT SIGNATURE_________________________________  NURSE SIGNATURE__________________________________  ________________________________________________________________________              CLEAR LIQUID DIET   Foods Allowed                                                                     Foods Excluded  Coffee and tea, regular and  decaf                             liquids that you cannot  Plain Jell-O in any flavor                                             see through such as: Fruit ices (not with fruit pulp)                                     milk, soups, orange juice  Iced Popsicles                                    All solid food Carbonated beverages, regular and diet                                    Cranberry, grape and apple juices Sports drinks like Gatorade Lightly seasoned clear broth or consume(fat free) Sugar, honey syrup  Sample Menu  Breakfast                                Lunch                                     Supper Cranberry juice                    Beef broth                            Chicken broth Jell-O                                     Grape juice                           Apple juice Coffee or tea                        Jell-O                                      Popsicle                                                Coffee or tea                        Coffee or tea  _____________________________________________________________________    Incentive Spirometer  An incentive spirometer is a tool that can help keep your lungs clear and active. This tool measures how well you are filling your lungs with each breath. Taking long deep breaths may help reverse or decrease the chance of developing breathing (pulmonary) problems (especially infection) following:  A long period of time when you are unable to move or be active. BEFORE THE PROCEDURE   If the spirometer includes an indicator to show your best effort, your nurse or respiratory therapist will set it to a desired goal.  If possible, sit up straight or lean slightly forward. Try not to slouch.  Hold the incentive spirometer in an upright position. INSTRUCTIONS FOR USE  1. Sit on the edge of your bed if possible, or sit up as far as you can in bed or on a chair. 2. Hold the incentive spirometer in an upright  position. 3. Breathe out normally. 4. Place the mouthpiece in your mouth and seal your lips tightly around it. 5. Breathe in slowly and as deeply as possible, raising the piston or the ball toward the top of the column. 6. Hold your breath for 3-5 seconds or for as long as possible. Allow the piston or ball to fall to the bottom of the column. 7. Remove the mouthpiece from your mouth and breathe out normally. 8. Rest for a few seconds and repeat Steps 1 through 7 at least 10 times every 1-2 hours when you are awake. Take your time and take a few normal breaths between deep breaths.  9. The spirometer may include an indicator to show your best effort. Use the indicator as a goal to work toward during each repetition. 10. After each set of 10 deep breaths, practice coughing to be sure your lungs are clear. If you have an incision (the cut made at the time of surgery), support your incision when coughing by placing a pillow or rolled up towels firmly against it. Once you are able to get out of bed, walk around indoors and cough well. You may stop using the incentive spirometer when instructed by your caregiver.  RISKS AND COMPLICATIONS  Take your time so you do not get dizzy or light-headed.  If you are in pain, you may need to take or ask for pain medication before doing incentive spirometry. It is harder to take a deep breath if you are having pain. AFTER USE  Rest and breathe slowly and easily.  It can be helpful to keep track of a log of your progress. Your caregiver can provide you with a simple table to help with this. If you are using the spirometer at home, follow these instructions: [place] IF:   You are having difficultly using the spirometer.  You have trouble using the spirometer as often as instructed.  Your pain medication is not giving enough relief while using the spirometer.  You develop fever of 100.5 F (38.1 C) or higher. SEEK IMMEDIATE MEDICAL CARE IF:    You cough up bloody sputum that had not been present before.  You develop fever of 102 F (38.9 C) or greater.  You develop worsening pain at or near the incision site. MAKE SURE YOU:   Understand these instructions.  Will watch your condition.  Will get help right away if you are not doing well or get worse. Document Released: 03/28/2007 Document Revised: 02/07/2012 Document Reviewed: 05/29/2007 Exit[employer] Patient Information 2014 [employer], [place].   ________________________________________________________________________  WHAT IS A BLOOD TRANSFUSION? Blood Transfusion Information  A transfusion is the replacement of blood or some of its parts. Blood is made up of multiple cells which provide different functions.  Red blood cells carry oxygen and are used for blood loss replacement.  White blood cells fight against infection.  Platelets control bleeding.  Plasma helps clot blood.  Other blood products are available for specialized needs, such as hemophilia or other clotting disorders. BEFORE THE TRANSFUSION  Who gives blood for transfusions?   Healthy volunteers who are fully evaluated to make sure their blood is safe. This is blood bank blood. Transfusion therapy is the safest it has ever been in the practice of medicine. Before blood is taken from a donor, a complete history is taken to make sure that person has no history of diseases nor engages in risky social behavior (examples are intravenous drug use or sexual activity with multiple partners). The donor's travel history is screened to minimize risk of transmitting infections, such as malaria. The donated blood is tested for signs of infectious diseases, such as HIV and hepatitis. The blood is then tested to be sure it is compatible with you in order to minimize the chance of a transfusion reaction. If you or a relative donates blood, this is often done in anticipation of surgery and is not appropriate for emergency  situations. It takes many days to process the donated blood. RISKS AND COMPLICATIONS Although transfusion therapy is very safe and saves many lives, the main dangers of transfusion include:   Getting an infectious disease.  Developing  a transfusion reaction. This is an allergic reaction to something in the blood you were given. Every precaution is taken to prevent this. The decision to have a blood transfusion has been considered carefully by your caregiver before blood is given. Blood is not given unless the benefits outweigh the risks. AFTER THE TRANSFUSION  Right after receiving a blood transfusion, you will usually feel much better and more energetic. This is especially true if your red blood cells have gotten low (anemic). The transfusion raises the level of the red blood cells which carry oxygen, and this usually causes an energy increase.  The nurse administering the transfusion will monitor you carefully for complications. HOME CARE INSTRUCTIONS  No special instructions are needed after a transfusion. You may find your energy is better. Speak with your caregiver about any limitations on activity for underlying diseases you may have. SEEK MEDICAL CARE IF:   Your condition is not improving after your transfusion.  You develop redness or irritation at the intravenous (IV) site. SEEK IMMEDIATE MEDICAL CARE IF:  Any of the following symptoms occur over the next 12 hours:  Shaking chills.  You have a temperature by mouth above 102 F (38.9 C), not controlled by medicine.  Chest, back, or muscle pain.  People around you feel you are not acting correctly or are confused.  Shortness of breath or difficulty breathing.  Dizziness and fainting.  You get a rash or develop hives.  You have a decrease in urine output.  Your urine turns a dark color or changes to pink, red, or brown. Any of the following symptoms occur over the next 10 days:  You have a temperature by mouth above  102 F (38.9 C), not controlled by medicine.  Shortness of breath.  Weakness after normal activity.  The white part of the eye turns yellow (jaundice).  You have a decrease in the amount of urine or are urinating less often.  Your urine turns a dark color or changes to pink, red, or brown. Document Released: 11/12/2000 Document Revised: 02/07/2012 Document Reviewed: 07/01/2008 [facility] Patient Information 2014 [place], [place].  _______________________________________________________________________

## 2016-10-04 NOTE — Progress Notes (Signed)
Clearance note per Dr Wynetta Emery on chart 09/14/2016

## 2016-10-07 ENCOUNTER — Encounter (HOSPITAL_COMMUNITY)
Admission: RE | Disposition: A | Discharge status: Home / Self Care | Payer: Self-pay | Source: Ambulatory Visit | Attending: Orthopedic Surgery

## 2016-10-07 ENCOUNTER — Inpatient Hospital Stay (HOSPITAL_COMMUNITY)
Admission: RE | Admit: 2016-10-07 | Discharge: 2016-10-09 | DRG: 470 | Disposition: A | Discharge status: Home / Self Care | Payer: Medicare Other | Source: Ambulatory Visit | Attending: Orthopedic Surgery | Admitting: Orthopedic Surgery

## 2016-10-07 ENCOUNTER — Inpatient Hospital Stay (HOSPITAL_COMMUNITY): Payer: Medicare Other

## 2016-10-07 ENCOUNTER — Encounter (HOSPITAL_COMMUNITY): Payer: Self-pay | Admitting: *Deleted

## 2016-10-07 ENCOUNTER — Inpatient Hospital Stay (HOSPITAL_COMMUNITY): Payer: Medicare Other | Admitting: Registered Nurse

## 2016-10-07 DIAGNOSIS — Z853 Personal history of malignant neoplasm of breast: Secondary | ICD-10-CM | POA: Diagnosis not present

## 2016-10-07 DIAGNOSIS — M1712 Unilateral primary osteoarthritis, left knee: Secondary | ICD-10-CM | POA: Diagnosis present

## 2016-10-07 DIAGNOSIS — I10 Essential (primary) hypertension: Secondary | ICD-10-CM | POA: Diagnosis present

## 2016-10-07 DIAGNOSIS — M25562 Pain in left knee: Secondary | ICD-10-CM | POA: Diagnosis present

## 2016-10-07 DIAGNOSIS — Z87440 Personal history of urinary (tract) infections: Secondary | ICD-10-CM | POA: Diagnosis not present

## 2016-10-07 DIAGNOSIS — Z09 Encounter for follow-up examination after completed treatment for conditions other than malignant neoplasm: Secondary | ICD-10-CM

## 2016-10-07 HISTORY — PX: KNEE ARTHROPLASTY: SHX992

## 2016-10-07 LAB — PREPARE RBC (CROSSMATCH)

## 2016-10-07 SURGERY — ARTHROPLASTY, KNEE, TOTAL, USING IMAGELESS COMPUTER-ASSISTED NAVIGATION
Anesthesia: Spinal | Site: Knee | Laterality: Left

## 2016-10-07 MED ORDER — VITAMIN D 1000 UNITS PO TABS
1000.0000 [IU] | ORAL_TABLET | Freq: Every day | ORAL | Status: DC
  Administered 2016-10-08 – 2016-10-09 (×2): 1000 [IU] via ORAL
  Filled 2016-10-07 (×2): qty 1

## 2016-10-07 MED ORDER — ONDANSETRON HCL 4 MG/2ML IJ SOLN
INTRAMUSCULAR | Status: AC
  Filled 2016-10-07: qty 2

## 2016-10-07 MED ORDER — ISOPROPYL ALCOHOL 70 % SOLN
Status: DC | PRN
Start: 2016-10-07 — End: 2016-10-07
  Administered 2016-10-07: 1 via TOPICAL

## 2016-10-07 MED ORDER — SODIUM CHLORIDE 0.9 % IV SOLN: Freq: Once | INTRAVENOUS | Status: DC

## 2016-10-07 MED ORDER — ACETAMINOPHEN 325 MG PO TABS: 650.0000 mg | ORAL_TABLET | Freq: Four times a day (QID) | ORAL | Status: DC | PRN

## 2016-10-07 MED ORDER — KETOROLAC TROMETHAMINE 30 MG/ML IJ SOLN
INTRAMUSCULAR | Status: DC | PRN
  Administered 2016-10-07: 30 mg

## 2016-10-07 MED ORDER — SODIUM CHLORIDE 0.9 % IR SOLN
Status: DC | PRN
  Administered 2016-10-07: 1000 mL

## 2016-10-07 MED ORDER — METOCLOPRAMIDE HCL 5 MG PO TABS
5.0000 mg | ORAL_TABLET | Freq: Three times a day (TID) | ORAL | Status: DC | PRN
Start: 2016-10-07 — End: 2016-10-09

## 2016-10-07 MED ORDER — ACETAMINOPHEN 10 MG/ML IV SOLN
1000.0000 mg | INTRAVENOUS | Status: AC
  Administered 2016-10-07: 1000 mg via INTRAVENOUS

## 2016-10-07 MED ORDER — METOCLOPRAMIDE HCL 5 MG/ML IJ SOLN
5.0000 mg | Freq: Three times a day (TID) | INTRAMUSCULAR | Status: DC | PRN
Start: 2016-10-07 — End: 2016-10-09

## 2016-10-07 MED ORDER — KETOROLAC TROMETHAMINE 30 MG/ML IJ SOLN
INTRAMUSCULAR | Status: AC
  Filled 2016-10-07: qty 1

## 2016-10-07 MED ORDER — METHOCARBAMOL 1000 MG/10ML IJ SOLN
500.0000 mg | Freq: Four times a day (QID) | INTRAVENOUS | Status: DC | PRN
  Administered 2016-10-07: 500 mg via INTRAVENOUS
  Filled 2016-10-07: qty 550
  Filled 2016-10-07: qty 5

## 2016-10-07 MED ORDER — FENTANYL CITRATE (PF) 100 MCG/2ML IJ SOLN
INTRAMUSCULAR | Status: AC
  Filled 2016-10-07: qty 2

## 2016-10-07 MED ORDER — DIPHENHYDRAMINE HCL 12.5 MG/5ML PO ELIX: 12.5000 mg | ORAL_SOLUTION | ORAL | Status: DC | PRN

## 2016-10-07 MED ORDER — APIXABAN 2.5 MG PO TABS
2.5000 mg | ORAL_TABLET | Freq: Two times a day (BID) | ORAL | Status: DC
  Administered 2016-10-08 – 2016-10-09 (×3): 2.5 mg via ORAL
  Filled 2016-10-07 (×3): qty 1

## 2016-10-07 MED ORDER — SODIUM CHLORIDE 0.9 % IR SOLN
Status: DC | PRN
  Administered 2016-10-07: 4000 mL

## 2016-10-07 MED ORDER — POVIDONE-IODINE 10 % EX SWAB: 2.0000 "application " | Freq: Once | CUTANEOUS | Status: DC

## 2016-10-07 MED ORDER — CHLORHEXIDINE GLUCONATE 4 % EX LIQD: 60.0000 mL | Freq: Once | CUTANEOUS | Status: DC

## 2016-10-07 MED ORDER — SODIUM CHLORIDE 0.9 % IV SOLN
INTRAVENOUS | Status: DC | PRN
  Administered 2016-10-07: 19:00:00 via INTRAVENOUS

## 2016-10-07 MED ORDER — DEXAMETHASONE SODIUM PHOSPHATE 10 MG/ML IJ SOLN
INTRAMUSCULAR | Status: AC
  Filled 2016-10-07: qty 1

## 2016-10-07 MED ORDER — PROPOFOL 10 MG/ML IV BOLUS
INTRAVENOUS | Status: AC
  Filled 2016-10-07: qty 20

## 2016-10-07 MED ORDER — STERILE WATER FOR IRRIGATION IR SOLN
Status: DC | PRN
  Administered 2016-10-07: 2000 mL

## 2016-10-07 MED ORDER — HYDROMORPHONE HCL 1 MG/ML IJ SOLN
0.5000 mg | INTRAMUSCULAR | Status: DC | PRN
  Administered 2016-10-08: 0.5 mg via INTRAVENOUS
  Filled 2016-10-07: qty 1

## 2016-10-07 MED ORDER — SENNA 8.6 MG PO TABS
2.0000 | ORAL_TABLET | Freq: Every day | ORAL | Status: DC
  Administered 2016-10-07: 17.2 mg via ORAL
  Filled 2016-10-07: qty 2

## 2016-10-07 MED ORDER — AMLODIPINE BESYLATE 10 MG PO TABS
10.0000 mg | ORAL_TABLET | Freq: Every evening | ORAL | Status: DC
  Administered 2016-10-07 – 2016-10-08 (×2): 10 mg via ORAL
  Filled 2016-10-07 (×2): qty 1

## 2016-10-07 MED ORDER — LACTATED RINGERS IV SOLN
INTRAVENOUS | Status: DC | PRN
  Administered 2016-10-07 (×2): via INTRAVENOUS

## 2016-10-07 MED ORDER — ONDANSETRON HCL 4 MG PO TABS: 4.0000 mg | ORAL_TABLET | Freq: Four times a day (QID) | ORAL | Status: DC | PRN

## 2016-10-07 MED ORDER — PHENOL 1.4 % MT LIQD: 1.0000 | OROMUCOSAL | Status: DC | PRN

## 2016-10-07 MED ORDER — DEXAMETHASONE SODIUM PHOSPHATE 10 MG/ML IJ SOLN
10.0000 mg | Freq: Once | INTRAMUSCULAR | Status: AC
  Administered 2016-10-08: 10 mg via INTRAVENOUS
  Filled 2016-10-07: qty 1

## 2016-10-07 MED ORDER — VALSARTAN-HYDROCHLOROTHIAZIDE 160-12.5 MG PO TABS: 1.0000 | ORAL_TABLET | Freq: Every day | ORAL | Status: DC

## 2016-10-07 MED ORDER — CYCLOSPORINE 0.05 % OP EMUL
1.0000 [drp] | Freq: Two times a day (BID) | OPHTHALMIC | Status: DC
  Administered 2016-10-07 – 2016-10-09 (×3): 1 [drp] via OPHTHALMIC
  Filled 2016-10-07 (×4): qty 1

## 2016-10-07 MED ORDER — SODIUM CHLORIDE 0.9 % IJ SOLN
INTRAMUSCULAR | Status: AC
  Filled 2016-10-07: qty 50

## 2016-10-07 MED ORDER — FENTANYL CITRATE (PF) 100 MCG/2ML IJ SOLN
INTRAMUSCULAR | Status: DC | PRN
  Administered 2016-10-07 (×3): 50 ug via INTRAVENOUS

## 2016-10-07 MED ORDER — BUPIVACAINE HCL (PF) 0.5 % IJ SOLN
INTRAMUSCULAR | Status: DC | PRN
  Administered 2016-10-07: 3 mL

## 2016-10-07 MED ORDER — HYDROGEN PEROXIDE 3 % EX SOLN
CUTANEOUS | Status: DC | PRN
  Administered 2016-10-07: 1 via TOPICAL

## 2016-10-07 MED ORDER — MIDAZOLAM HCL 2 MG/2ML IJ SOLN
INTRAMUSCULAR | Status: AC
  Filled 2016-10-07: qty 2

## 2016-10-07 MED ORDER — SODIUM CHLORIDE 0.9 % IV SOLN
INTRAVENOUS | Status: DC
  Administered 2016-10-07: 14:00:00 via INTRAVENOUS

## 2016-10-07 MED ORDER — PROPOFOL 500 MG/50ML IV EMUL
INTRAVENOUS | Status: DC | PRN
  Administered 2016-10-07: 100 ug/kg/min via INTRAVENOUS

## 2016-10-07 MED ORDER — OXYCODONE HCL 5 MG PO TABS
5.0000 mg | ORAL_TABLET | Freq: Once | ORAL | Status: DC | PRN
Start: 2016-10-07 — End: 2016-10-07

## 2016-10-07 MED ORDER — TRANEXAMIC ACID 1000 MG/10ML IV SOLN
1000.0000 mg | INTRAVENOUS | Status: AC
  Administered 2016-10-07: 1000 mg via INTRAVENOUS
  Filled 2016-10-07: qty 10

## 2016-10-07 MED ORDER — TRANEXAMIC ACID 1000 MG/10ML IV SOLN
1000.0000 mg | Freq: Once | INTRAVENOUS | Status: AC
  Administered 2016-10-07: 1000 mg via INTRAVENOUS
  Filled 2016-10-07: qty 10

## 2016-10-07 MED ORDER — POVIDONE-IODINE 10 % EX SOLN
CUTANEOUS | Status: DC | PRN
  Administered 2016-10-07: 1 via TOPICAL

## 2016-10-07 MED ORDER — DOCUSATE SODIUM 100 MG PO CAPS
100.0000 mg | ORAL_CAPSULE | Freq: Two times a day (BID) | ORAL | Status: DC
  Administered 2016-10-07 – 2016-10-09 (×4): 100 mg via ORAL
  Filled 2016-10-07 (×4): qty 1

## 2016-10-07 MED ORDER — SODIUM CHLORIDE 0.9 % IV SOLN
INTRAVENOUS | Status: DC
  Administered 2016-10-07 – 2016-10-08 (×2): via INTRAVENOUS

## 2016-10-07 MED ORDER — MIDAZOLAM HCL 5 MG/5ML IJ SOLN
INTRAMUSCULAR | Status: DC | PRN
  Administered 2016-10-07 (×2): 1 mg via INTRAVENOUS

## 2016-10-07 MED ORDER — IRBESARTAN 150 MG PO TABS
150.0000 mg | ORAL_TABLET | Freq: Every day | ORAL | Status: DC
  Administered 2016-10-08 – 2016-10-09 (×2): 150 mg via ORAL
  Filled 2016-10-07 (×2): qty 1

## 2016-10-07 MED ORDER — ONDANSETRON HCL 4 MG/2ML IJ SOLN: 4.0000 mg | Freq: Four times a day (QID) | INTRAMUSCULAR | Status: DC | PRN

## 2016-10-07 MED ORDER — VANCOMYCIN HCL IN DEXTROSE 1-5 GM/200ML-% IV SOLN
1000.0000 mg | Freq: Two times a day (BID) | INTRAVENOUS | Status: AC
  Administered 2016-10-08: 1000 mg via INTRAVENOUS
  Filled 2016-10-07: qty 200

## 2016-10-07 MED ORDER — BUPIVACAINE HCL (PF) 0.25 % IJ SOLN
INTRAMUSCULAR | Status: AC
  Filled 2016-10-07: qty 30

## 2016-10-07 MED ORDER — PHENYLEPHRINE HCL 10 MG/ML IJ SOLN
INTRAVENOUS | Status: DC | PRN
  Administered 2016-10-07: 25 ug/min via INTRAVENOUS

## 2016-10-07 MED ORDER — OXYCODONE HCL 5 MG/5ML PO SOLN: 5.0000 mg | Freq: Once | ORAL | Status: DC | PRN

## 2016-10-07 MED ORDER — BUPIVACAINE HCL (PF) 0.25 % IJ SOLN
INTRAMUSCULAR | Status: DC | PRN
  Administered 2016-10-07: 30 mL

## 2016-10-07 MED ORDER — ACETAMINOPHEN 650 MG RE SUPP: 650.0000 mg | Freq: Four times a day (QID) | RECTAL | Status: DC | PRN

## 2016-10-07 MED ORDER — HYDROCODONE-ACETAMINOPHEN 5-325 MG PO TABS
1.0000 | ORAL_TABLET | ORAL | Status: DC | PRN
  Administered 2016-10-07: 1 via ORAL
  Administered 2016-10-08 (×2): 2 via ORAL
  Administered 2016-10-08 (×3): 1 via ORAL
  Administered 2016-10-09: 2 via ORAL
  Administered 2016-10-09 (×2): 1 via ORAL
  Filled 2016-10-07 (×2): qty 1
  Filled 2016-10-07 (×2): qty 2
  Filled 2016-10-07: qty 1
  Filled 2016-10-07: qty 2
  Filled 2016-10-07 (×2): qty 1
  Filled 2016-10-07: qty 2

## 2016-10-07 MED ORDER — SODIUM CHLORIDE 0.9 % IJ SOLN
INTRAMUSCULAR | Status: AC
  Filled 2016-10-07: qty 10

## 2016-10-07 MED ORDER — POLYETHYLENE GLYCOL 3350 17 G PO PACK
17.0000 g | PACK | Freq: Every day | ORAL | Status: DC | PRN
Start: 2016-10-07 — End: 2016-10-09

## 2016-10-07 MED ORDER — MIRTAZAPINE 7.5 MG PO TABS
7.5000 mg | ORAL_TABLET | Freq: Every day | ORAL | Status: DC
  Administered 2016-10-07 – 2016-10-08 (×2): 7.5 mg via ORAL
  Filled 2016-10-07 (×2): qty 1

## 2016-10-07 MED ORDER — FENTANYL CITRATE (PF) 100 MCG/2ML IJ SOLN
25.0000 ug | INTRAMUSCULAR | Status: DC | PRN
  Administered 2016-10-07: 25 ug via INTRAVENOUS

## 2016-10-07 MED ORDER — MENTHOL 3 MG MT LOZG: 1.0000 | LOZENGE | OROMUCOSAL | Status: DC | PRN

## 2016-10-07 MED ORDER — ALUM & MAG HYDROXIDE-SIMETH 200-200-20 MG/5ML PO SUSP: 30.0000 mL | ORAL | Status: DC | PRN

## 2016-10-07 MED ORDER — VANCOMYCIN HCL IN DEXTROSE 1-5 GM/200ML-% IV SOLN
1000.0000 mg | INTRAVENOUS | Status: AC
  Administered 2016-10-07: 1000 mg via INTRAVENOUS
  Filled 2016-10-07: qty 200

## 2016-10-07 MED ORDER — SODIUM CHLORIDE 0.9 % IJ SOLN
INTRAMUSCULAR | Status: DC | PRN
  Administered 2016-10-07: 29 mL

## 2016-10-07 MED ORDER — BUPIVACAINE HCL (PF) 0.75 % IJ SOLN
INTRAMUSCULAR | Status: DC | PRN
  Administered 2016-10-07: 1.8 mL via INTRATHECAL

## 2016-10-07 MED ORDER — FENTANYL CITRATE (PF) 100 MCG/2ML IJ SOLN: 25.0000 ug | INTRAMUSCULAR | Status: DC | PRN

## 2016-10-07 MED ORDER — METHOCARBAMOL 500 MG PO TABS
500.0000 mg | ORAL_TABLET | Freq: Four times a day (QID) | ORAL | Status: DC | PRN
  Administered 2016-10-08 – 2016-10-09 (×3): 500 mg via ORAL
  Filled 2016-10-07 (×3): qty 1

## 2016-10-07 MED ORDER — PROPOFOL 10 MG/ML IV BOLUS
INTRAVENOUS | Status: AC
  Filled 2016-10-07: qty 60

## 2016-10-07 MED ORDER — ACETAMINOPHEN 10 MG/ML IV SOLN
INTRAVENOUS | Status: AC
  Filled 2016-10-07: qty 100

## 2016-10-07 MED ORDER — HYDROCHLOROTHIAZIDE 12.5 MG PO CAPS
12.5000 mg | ORAL_CAPSULE | Freq: Every day | ORAL | Status: DC
  Administered 2016-10-08 – 2016-10-09 (×2): 12.5 mg via ORAL
  Filled 2016-10-07 (×2): qty 1

## 2016-10-07 MED ORDER — PHENYLEPHRINE HCL 10 MG/ML IJ SOLN
INTRAMUSCULAR | Status: AC
  Filled 2016-10-07: qty 1

## 2016-10-07 SURGICAL SUPPLY — 66 items
BAG ZIPLOCK 12X15 (MISCELLANEOUS) ×4 IMPLANT
BANDAGE ACE 4X5 VEL STRL LF (GAUZE/BANDAGES/DRESSINGS) ×2 IMPLANT
BANDAGE ACE 6X5 VEL STRL LF (GAUZE/BANDAGES/DRESSINGS) ×2 IMPLANT
BANDAGE ESMARK 6X9 LF (GAUZE/BANDAGES/DRESSINGS) ×1 IMPLANT
BLADE SAW RECIPROCATING 77.5 (BLADE) ×2 IMPLANT
BLOCK HALF TIBIAL AUGMENT (Orthopedic Implant) ×2 IMPLANT
BLOCK HALF TIBIAL AUGMENT KNEE (Block) ×2 IMPLANT
BNDG ESMARK 6X9 LF (GAUZE/BANDAGES/DRESSINGS) ×2
BONE CEMENT SIMPLEX TOBRAMYCIN (Cement) ×3 IMPLANT
CAPT KNEE TOTAL 3 ×2 IMPLANT
CEMENT BONE SIMPLEX TOBRAMYCIN (Cement) ×3 IMPLANT
CHLORAPREP W/TINT 26ML (MISCELLANEOUS) ×4 IMPLANT
CUFF TOURN SGL QUICK 34 (TOURNIQUET CUFF) ×1
CUFF TRNQT CYL 34X4X40X1 (TOURNIQUET CUFF) ×1 IMPLANT
DECANTER SPIKE VIAL GLASS SM (MISCELLANEOUS) ×2 IMPLANT
DERMABOND ADVANCED (GAUZE/BANDAGES/DRESSINGS) ×1
DERMABOND ADVANCED .7 DNX12 (GAUZE/BANDAGES/DRESSINGS) ×1 IMPLANT
DRAPE SHEET LG 3/4 BI-LAMINATE (DRAPES) ×6 IMPLANT
DRAPE U-SHAPE 47X51 STRL (DRAPES) ×2 IMPLANT
DRESSING AQUACEL AG SP 3.5X10 (GAUZE/BANDAGES/DRESSINGS) ×1 IMPLANT
DRSG AQUACEL AG ADV 3.5X10 (GAUZE/BANDAGES/DRESSINGS) ×2 IMPLANT
DRSG AQUACEL AG SP 3.5X10 (GAUZE/BANDAGES/DRESSINGS) ×2
DRSG TEGADERM 4X4.75 (GAUZE/BANDAGES/DRESSINGS) ×2 IMPLANT
ELECT BLADE TIP CTD 4 INCH (ELECTRODE) ×4 IMPLANT
ELECT REM PT RETURN 9FT ADLT (ELECTROSURGICAL) ×2
ELECTRODE REM PT RTRN 9FT ADLT (ELECTROSURGICAL) ×1 IMPLANT
EVACUATOR 1/8 PVC DRAIN (DRAIN) IMPLANT
GAUZE SPONGE 4X4 12PLY STRL (GAUZE/BANDAGES/DRESSINGS) ×2 IMPLANT
GLOVE BIO SURGEON STRL SZ8.5 (GLOVE) ×4 IMPLANT
GLOVE BIOGEL PI IND STRL 8.5 (GLOVE) ×1 IMPLANT
GLOVE BIOGEL PI INDICATOR 8.5 (GLOVE) ×1
GOWN SPEC L3 XXLG W/TWL (GOWN DISPOSABLE) ×2 IMPLANT
HANDPIECE INTERPULSE COAX TIP (DISPOSABLE) ×1
HOOD PEEL AWAY FLYTE STAYCOOL (MISCELLANEOUS) ×4 IMPLANT
MARKER SKIN DUAL TIP RULER LAB (MISCELLANEOUS) ×4 IMPLANT
NEEDLE SPNL 18GX3.5 QUINCKE PK (NEEDLE) ×2 IMPLANT
NS IRRIG 1000ML POUR BTL (IV SOLUTION) ×2 IMPLANT
PACK TOTAL KNEE CUSTOM (KITS) ×2 IMPLANT
PADDING CAST COTTON 6X4 STRL (CAST SUPPLIES) ×2 IMPLANT
POSITIONER SURGICAL ARM (MISCELLANEOUS) ×2 IMPLANT
SAW OSC TIP CART 19.5X105X1.3 (SAW) ×2 IMPLANT
SEALER BIPOLAR AQUA 6.0 (INSTRUMENTS) ×2 IMPLANT
SET HNDPC FAN SPRY TIP SCT (DISPOSABLE) ×1 IMPLANT
SET PAD KNEE POSITIONER (MISCELLANEOUS) ×2 IMPLANT
SOL PREP POV-IOD 4OZ 10% (MISCELLANEOUS) ×2 IMPLANT
SPONGE DRAIN TRACH 4X4 STRL 2S (GAUZE/BANDAGES/DRESSINGS) ×2 IMPLANT
SPONGE LAP 18X18 X RAY DECT (DISPOSABLE) IMPLANT
STEM CEMENTED TRIATHLON (Stem) ×2 IMPLANT
SUCTION FRAZIER HANDLE 12FR (TUBING) ×2
SUCTION TUBE FRAZIER 12FR DISP (TUBING) ×2 IMPLANT
SUT MNCRL AB 3-0 PS2 18 (SUTURE) ×2 IMPLANT
SUT MON AB 2-0 CT1 36 (SUTURE) ×4 IMPLANT
SUT STRATAFIX PDO 1 14 VIOLET (SUTURE) ×1
SUT STRATFX PDO 1 14 VIOLET (SUTURE) ×1
SUT VIC AB 1 CT1 36 (SUTURE) ×6 IMPLANT
SUT VIC AB 2-0 CT1 27 (SUTURE) ×1
SUT VIC AB 2-0 CT1 TAPERPNT 27 (SUTURE) ×1 IMPLANT
SUTURE STRATFX PDO 1 14 VIOLET (SUTURE) ×1 IMPLANT
SYR 50ML LL SCALE MARK (SYRINGE) ×2 IMPLANT
TOWER CARTRIDGE SMART MIX (DISPOSABLE) ×2 IMPLANT
TRAY FOLEY CATH 14FRSI W/METER (CATHETERS) ×2 IMPLANT
TRAY FOLEY W/METER SILVER 16FR (SET/KITS/TRAYS/PACK) IMPLANT
Triathlon Tibial Augment Half Block (Knees) ×4 IMPLANT
WATER STERILE IRR 1500ML POUR (IV SOLUTION) ×4 IMPLANT
WRAP KNEE MAXI GEL POST OP (GAUZE/BANDAGES/DRESSINGS) ×2 IMPLANT
YANKAUER SUCT BULB TIP 10FT TU (MISCELLANEOUS) ×2 IMPLANT

## 2016-10-07 NOTE — H&P (View-Only) (Signed)
TOTAL KNEE ADMISSION H&P  Patient is being admitted for left total knee arthroplasty.  Subjective:  Chief Complaint:left knee pain.  HPI: Tracy Faulkner, 82 y.o. female, has a history of pain and functional disability in the left knee due to arthritis and has failed non-surgical conservative treatments for greater than 12 weeks to includeNSAID's and/or analgesics, corticosteriod injections, flexibility and strengthening excercises, use of assistive devices, weight reduction as appropriate and activity modification.  Onset of symptoms was gradual, starting >10 years ago with gradually worsening course since that time. The patient noted no past surgery on the left knee(s).  Patient currently rates pain in the left knee(s) at 10 out of 10 with activity. Patient has night pain, worsening of pain with activity and weight bearing, pain that interferes with activities of daily living, pain with passive range of motion, crepitus and joint swelling.  Patient has evidence of subchondral cysts, subchondral sclerosis, periarticular osteophytes, joint subluxation and joint space narrowing by imaging studies.  There is no active infection.  Patient Active Problem List   Diagnosis Date Noted  . Near syncope 12/31/2015  . Chest discomfort 12/31/2015  . Premature ventricular contractions 12/31/2015  . Breast cancer ([place])   . Disturbance, sleep   . Arthritis   . Anemia   . Frequent headaches   . Back problem   . Bowel trouble   . Obesity   . Carpal tunnel syndrome   . Ataxia   . Hypertension   . Pain in both knees    Past Medical History:  Diagnosis Date  . Anemia   . Arthritis   . Ataxia   . Back problem   . Bowel trouble   . Breast cancer ([place])   . Carpal tunnel syndrome   . Disturbance, sleep   . Frequent headaches   . Hypertension   . Obesity   . Pain in both knees     Past Surgical History:  Procedure Laterality Date  . BREAST SURGERY     for cancer-right breast  . gastric stapling        (Not in a hospital admission) Allergies  Allergen Reactions  . Ace Inhibitors Cough  . Lortab [Hydrocodone-Acetaminophen]     Bad dreams  . Remeron [Mirtazapine] Other (See Comments)    Hallucinations (can take 1/2 tablet--7.5 mg), not 15 mg  . Penicillins Rash and Other (See Comments)    Throat swelling, Has patient had a PCN reaction causing immediate rash, facial/tongue/throat swelling, SOB or lightheadedness with hypotension: Yes Has patient had a PCN reaction causing severe rash involving mucus membranes or skin necrosis: No Has patient had a PCN reaction that required hospitalization No Has patient had a PCN reaction occurring within the last 10 years: No If all of the above answers are "NO", then may proceed with Cephalosporin use.     Social History  Substance Use Topics  . Smoking status: Never Smoker  . Smokeless tobacco: Never Used  . Alcohol use No    Family History  Problem Relation Age of Onset  . Arthritis Mother   . Cancer Mother   . Diabetes Mellitus II Mother   . Hypertension Mother   . Kidney disease Mother      Review of Systems  Constitutional: Positive for diaphoresis.  Gastrointestinal: Positive for heartburn.  Musculoskeletal: Positive for back pain and joint pain.  Skin: Negative.   Neurological: Positive for dizziness, tremors and headaches.  Psychiatric/Behavioral: Negative.     Objective:  Physical Exam  Constitutional: She is oriented to person, place, and time. She appears well-developed and well-nourished.  HENT:  Head: Normocephalic and atraumatic.  Eyes: Conjunctivae and EOM are normal. Pupils are equal, round, and reactive to light.  Neck: Normal range of motion. Neck supple.  Cardiovascular: Normal rate, regular rhythm and intact distal pulses.   Respiratory: Effort normal and breath sounds normal.  GI: Soft. She exhibits no distension.  Genitourinary:  Genitourinary Comments: deferred  Musculoskeletal:       Left knee:  She exhibits decreased range of motion and abnormal alignment. Tenderness found. Medial joint line and lateral joint line tenderness noted.  Neurological: She is alert and oriented to person, place, and time. She has normal reflexes.  Skin: Skin is warm and dry.  Psychiatric: She has a normal mood and affect. Her behavior is normal. Judgment and thought content normal.    Vital signs in last 24 hours: @VSRANGES @  Labs:   Estimated body mass index is 35.49 kg/m as calculated from the following:   Height as of 09/07/16: 5' 4.5" (1.638 m).   Weight as of 09/07/16: 95.3 kg (210 lb).   Imaging Review Plain radiographs demonstrate severe degenerative joint disease of the left knee(s). The overall alignment issignificant varus. The bone quality appears to be adequate for age and reported activity level.  Assessment/Plan:  End stage arthritis, left knee   The patient history, physical examination, clinical judgment of the provider and imaging studies are consistent with end stage degenerative joint disease of the left knee(s) and total knee arthroplasty is deemed medically necessary. The treatment options including medical management, injection therapy arthroscopy and arthroplasty were discussed at length. The risks and benefits of total knee arthroplasty were presented and reviewed. The risks due to aseptic loosening, infection, stiffness, patella tracking problems, thromboembolic complications and other imponderables were discussed. The patient acknowledged the explanation, agreed to proceed with the plan and consent was signed. Patient is being admitted for inpatient treatment for surgery, pain control, PT, OT, prophylactic antibiotics, VTE prophylaxis, progressive ambulation and ADL's and discharge planning. The patient is planning to be discharged home with home health services

## 2016-10-07 NOTE — Interval H&P Note (Signed)
History and Physical Interval Note:  10/07/2016 3:21 PM  Tracy Faulkner  has presented today for surgery, with the diagnosis of DEGENERATIVE JOINT LEFT KNEE  The various methods of treatment have been discussed with the patient and family. After consideration of risks, benefits and other options for treatment, the patient has consented to  Procedure(s): LEFT TOTAL KNEE ARTHROPLASTY WITH COMPUTER NAVIGATION (Left) as a surgical intervention .  The patient's history has been reviewed, patient examined, no change in status, stable for surgery.  I have reviewed the patient's chart and labs.  Questions were answered to the patient's satisfaction.     Theona Muhs, Horald Pollen

## 2016-10-07 NOTE — Op Note (Signed)
OPERATIVE REPORT  SURGEON: Rod Can, MD   ASSISTANT: Merla Riches, PA-C.  PREOPERATIVE DIAGNOSIS: Left knee arthritis.   POSTOPERATIVE DIAGNOSIS: Left knee arthritis.   PROCEDURE: Left total knee arthroplasty.   IMPLANTS: Stryker Triathlon CR femur, size 4. Stryker universal tibia, size 5, with 5 mm medial and lateral augments, and a 12 x 50 mm cemented stem. X3 polyethelyene insert, size 16 mm, CS. 3 button asymmetric patella, size 35 mm. Simplex P bone cement.  ANESTHESIA:  Spinal  TOURNIQUET TIME: 50 min at 331mm Hg.  ESTIMATED BLOOD LOSS: 500 mL.  BLOOD PRODUCTS: One unit packed red blood cells.  ANTIBIOTICS: 2 g Ancef.  DRAINS: Medium HV x1.  COMPLICATIONS: None   CONDITION: PACU - hemodynamically stable.   BRIEF CLINICAL NOTE: Tracy Faulkner is a 82 y.o. female with a long-standing history of Left knee arthritis. After failing conservative management, the patient was indicated for total knee arthroplasty. The risks, benefits, and alternatives to the procedure were explained, and the patient elected to proceed.  PROCEDURE IN DETAIL: Spinal anesthesia was obtained in the pre-op holding area. Once inside the operative room, a foley catheter was inserted. The patient was then positioned, a nonsterile tourniquet was placed, and the lower extremity was prepped and draped in the normal sterile surgical fashion. A time-out was called verifying side and site of surgery. The patient received IV antibiotics within 60 minutes of beginning the procedure.   An anterior approach to the knee was performed utilizing a mid vastus arthrotomy. A medial release was performed and the patellar fat pad was excised. Stryker navigation was used to cut the distal femur perpendicular to the mechanical axis. A freehand patellar resection was performed, and the patella was sized an prepared with 3  lug holes.  Nagivation was used to make a neutral proximal tibia resection, taking 4 mm of bone from the less affected lateral side with 3 degrees of slope. The menisci were excised. A spacer block was placed, and the alignment and balance in extension were confirmed.   The distal femur was sized using the 3-degree external rotation guide referencing the posterior femoral cortex. The appropriate 4-in-1 cutting block was pinned into place. Rotation was checked using Whiteside's line, the epicondylar axis, and then confirmed with a spacer block in flexion. The remaining femoral cuts were performed, taking care to protect the MCL. She had large osteophytes off the posterior femoral condyles which were removed.  The tibia was sized and the trial tray was pinned into place. The remaining trail components were inserted. Her extension gap had significantly opened up upon removing all the posterior osteophytes. At this point, I determined that I would need 5 mm tibial augments. The proximal tibia was prepared accounting for 5 mm augments and a 50 mm cemented stem. The trial tibia was assembled and inserted. The remainder of the trial components were inserted. The knee was stable to varus and valgus stress through a full range of motion. The patella tracked centrally, and the PCL was well balanced. The extremity was exsanguinated with an Esmarch, and the tourniquet was inflated. Small drill holes were made in the sclerotic subchondral bone.The cut bony surfaces were irrigated with pulse lavage. Final components were cemented into place and excess cement was cleared. The trial insert was placed, and the knee was brought into extension while the cement polymerized. Once the cement was hard, the knee was tested for a final time and found to be well balanced. The trial insert was exchanged  for the real polyethylene insert.  The wound was copiously irrigated with a dilute betadine solution followed by normal saline with  pulse lavage. Marcaine solution was injected into the periarticular soft tissue. The wound was closed in layers using #1 Vicryl and #1 strata fix for the fascia, 2-0 Vicryl for the subcutaneous fat, 2-0 Monocryl for the deep dermal layer, 3-0 running Monocryl subcuticular Stitch, and Dermabond for the skin. Once the glue was fully dried, an Aquacell Ag and compressive dressing were applied. The tourniquet was let down, and the patient was transported to the recovery room in stable ondition. Sponge, needle, and instrument counts were correct at the end of the case x2. The patient tolerated the procedure well and there were no known complications.  Please note that a surgical assistant was a medical necessity for this procedure in order to perform it in a safe and expeditious manner. Surgical assistant was necessary to retract the ligaments and vital neurovascular structures to prevent injury to them and also necessary for proper positioning of the limb to allow for anatomic placement of the prosthesis.

## 2016-10-07 NOTE — Anesthesia Procedure Notes (Signed)
Spinal  Patient location during procedure: OR Start time: 10/07/2016 4:00 PM End time: 10/07/2016 4:20 PM Staffing Anesthesiologist: Rica Koyanagi Performed: anesthesiologist  Preanesthetic Checklist Completed: patient identified, site marked, surgical consent, pre-op evaluation, timeout performed, IV checked, risks and benefits discussed and monitors and equipment checked Spinal Block Patient position: sitting Prep: Betadine Patient monitoring: heart rate, cardiac monitor, continuous pulse ox and blood pressure Approach: right paramedian Location: L3-4 Needle Needle type: Quincke  Needle gauge: 25 G Needle length: 9 cm Needle insertion depth: 8 cm Assessment Sensory level: T6

## 2016-10-07 NOTE — Transfer of Care (Signed)
Immediate Anesthesia Transfer of Care Note  Patient: Tracy Faulkner  Procedure(s) Performed: Procedure(s): LEFT TOTAL KNEE ARTHROPLASTY WITH COMPUTER NAVIGATION (Left)  Patient Location: PACU  Anesthesia Type:Spinal  Level of Consciousness: sedated  Airway & Oxygen Therapy: Patient Spontanous Breathing and Patient connected to face mask oxygen  Post-op Assessment: Report given to RN and Post -op Vital signs reviewed and stable  Post vital signs: Reviewed and stable  Last Vitals:  Vitals:   10/07/16 1422  BP: (!) 151/108  Pulse: 73  Resp: 16  Temp: 36.4 C    Last Pain:  Vitals:   10/07/16 1422  TempSrc: Oral         Complications: No apparent anesthesia complications

## 2016-10-07 NOTE — Anesthesia Preprocedure Evaluation (Signed)
Anesthesia Evaluation  Patient identified by MRN, date of birth, ID band Patient awake    Reviewed: Allergy & Precautions, NPO status , Patient's Chart, lab work & pertinent test results  Airway Mallampati: II  TM Distance: >3 FB Neck ROM: Full    Dental   Pulmonary shortness of breath,    breath sounds clear to auscultation       Cardiovascular hypertension,  Rhythm:Regular Rate:Normal     Neuro/Psych  Neuromuscular disease    GI/Hepatic   Endo/Other    Renal/GU      Musculoskeletal  (+) Arthritis ,   Abdominal   Peds  Hematology  (+) anemia ,   Anesthesia Other Findings   Reproductive/Obstetrics                             Anesthesia Physical Anesthesia Plan  ASA: III  Anesthesia Plan: Spinal   Post-op Pain Management:    Induction: Intravenous  Airway Management Planned: Simple Face Mask  Additional Equipment:   Intra-op Plan:   Post-operative Plan:   Informed Consent: I have reviewed the patients History and Physical, chart, labs and discussed the procedure including the risks, benefits and alternatives for the proposed anesthesia with the patient or authorized representative who has indicated his/her understanding and acceptance.     Plan Discussed with: CRNA  Anesthesia Plan Comments:         Anesthesia Quick Evaluation

## 2016-10-08 LAB — CBC
HEMATOCRIT: 26.6 % — AB (ref 36.0–46.0)
Hemoglobin: 8.5 g/dL — ABNORMAL LOW (ref 12.0–15.0)
MCH: 25.4 pg — ABNORMAL LOW (ref 26.0–34.0)
MCHC: 32 g/dL (ref 30.0–36.0)
MCV: 79.4 fL (ref 78.0–100.0)
Platelets: 125 10*3/uL — ABNORMAL LOW (ref 150–400)
RBC: 3.35 MIL/uL — ABNORMAL LOW (ref 3.87–5.11)
RDW: 15.5 % (ref 11.5–15.5)
WBC: 8.5 10*3/uL (ref 4.0–10.5)

## 2016-10-08 LAB — BASIC METABOLIC PANEL
Anion gap: 5 (ref 5–15)
BUN: 23 mg/dL — ABNORMAL HIGH (ref 6–20)
CALCIUM: 8.1 mg/dL — AB (ref 8.9–10.3)
CHLORIDE: 114 mmol/L — AB (ref 101–111)
CO2: 22 mmol/L (ref 22–32)
CREATININE: 1.2 mg/dL — AB (ref 0.44–1.00)
GFR calc Af Amer: 47 mL/min — ABNORMAL LOW (ref >60)
GFR calc non Af Amer: 41 mL/min — ABNORMAL LOW (ref >60)
GLUCOSE: 112 mg/dL — AB (ref 65–99)
Potassium: 4 mmol/L (ref 3.5–5.1)
Sodium: 141 mmol/L (ref 135–145)

## 2016-10-08 MED ORDER — ONDANSETRON HCL 4 MG PO TABS: 4.0000 mg | ORAL_TABLET | Freq: Four times a day (QID) | ORAL | 0 refills | Status: DC | PRN

## 2016-10-08 MED ORDER — SENNA 8.6 MG PO TABS: 2.0000 | ORAL_TABLET | Freq: Every day | ORAL | 0 refills | Status: DC

## 2016-10-08 MED ORDER — APIXABAN 2.5 MG PO TABS: 2.5000 mg | ORAL_TABLET | Freq: Two times a day (BID) | ORAL | 0 refills | Status: DC

## 2016-10-08 MED ORDER — HYDROCODONE-ACETAMINOPHEN 5-325 MG PO TABS: 1.0000 | ORAL_TABLET | ORAL | 0 refills | Status: DC | PRN

## 2016-10-08 MED ORDER — METHOCARBAMOL 500 MG PO TABS: 500.0000 mg | ORAL_TABLET | Freq: Four times a day (QID) | ORAL | 0 refills | Status: DC | PRN

## 2016-10-08 MED ORDER — DOCUSATE SODIUM 100 MG PO CAPS: 100.0000 mg | ORAL_CAPSULE | Freq: Two times a day (BID) | ORAL | 0 refills | Status: DC

## 2016-10-08 NOTE — Progress Notes (Signed)
   Subjective:  Patient reports pain as mild to moderate.  Denies N/V/CP/SOB.  Objective:   VITALS:   Vitals:   10/08/16 0040 10/08/16 0440 10/08/16 0857 10/08/16 1345  BP: (!) 130/46 (!) 107/51 (!) 100/56 (!) 145/82  Pulse: 75 69 66 89  Resp: 15 16 16 16   Temp: 97.8 F (36.6 C) 98.4 F (36.9 C) 98.3 F (36.8 C) 98.5 F (36.9 C)  TempSrc: Oral Oral Oral Oral  SpO2: 97% 100% 100% 100%  Weight:      Height:        NAD ABD soft Sensation intact distally Intact pulses distally Dorsiflexion/Plantar flexion intact Incision: no drainage Compartment soft HV scant ss   Lab Results  Component Value Date   WBC 8.5 10/08/2016   HGB 8.5 (L) 10/08/2016   HCT 26.6 (L) 10/08/2016   MCV 79.4 10/08/2016   PLT 125 (L) 10/08/2016   BMET    Component Value Date/Time   NA 141 10/08/2016 0411   K 4.0 10/08/2016 0411   CL 114 (H) 10/08/2016 0411   CO2 22 10/08/2016 0411   GLUCOSE 112 (H) 10/08/2016 0411   BUN 23 (H) 10/08/2016 0411   CREATININE 1.20 (H) 10/08/2016 0411   CALCIUM 8.1 (L) 10/08/2016 0411   GFRNONAA 41 (L) 10/08/2016 0411   GFRAA 47 (L) 10/08/2016 0411     Assessment/Plan: 1 Day Post-Op   Principal Problem:   Degenerative arthritis of left knee   WBAT with walker DVT ppx: apixaban, SCDs, TEDs PO pain control PT/OT D/C HV drain Dispo: DME ordered, d/c home tomorrow, OP PT already set up for Monday   Tracy Faulkner 10/08/2016, 4:27 PM   Rod Can, MD Cell [phone]

## 2016-10-08 NOTE — Evaluation (Signed)
Occupational Therapy Evaluation Patient Details Name: Tracy Faulkner MRN: [id number] DOB: [birth year] Today's Date: 10/08/2016    History of Present Illness L TKA, received 2 units of blood post op   Clinical Impression   This 82 year old female was admitted for the above. She was mod I with adls prior to admission and currently needs min to mod A to stand and up to mod A for adls.  She will benefit from continued OT in acute setting to complete education on bathroom transfers. Goals are for min guard assistance    Follow Up Recommendations  Supervision/Assistance - 24 hour    Equipment Recommendations  3 in 1 bedside comode (dellivered)    Recommendations for Other Services       Precautions / Restrictions Precautions Precautions: Fall;Knee Restrictions LLE Weight Bearing: Weight bearing as tolerated      Mobility Bed Mobility Overal bed mobility: Needs Assistance Bed Mobility: Supine to Sit     Supine to sit:  (by PT)     General bed mobility comments: oob  Transfers Overall transfer level: Needs assistance Equipment used: Rolling walker (2 wheeled) Transfers: Sit to/from Stand Sit to Stand: Min assist;Mod assist         General transfer comment: cues for hand placement  and Left leg position, assist to power up to stand.  Pt rocks for momentum to stand    Balance                                            ADL Overall ADL's : Needs assistance/impaired     Grooming: Oral care;Supervision/safety;Standing   Upper Body Bathing: Set up;Sitting   Lower Body Bathing: Minimal assistance;Sit to/from stand   Upper Body Dressing : Set up;Sitting   Lower Body Dressing: Moderate assistance;Sit to/from stand                 General ADL Comments: pt goes to exercise 4x week.  Stood at sink and brushed her teeth. Catheter in place.  Educated on knee precautions.  3:1 was delivered, educated on uses and splash guard for over the  toilet. Pt will stay with her daughter initially     Vision     Perception     Praxis      Pertinent Vitals/Pain Pain Assessment: 0-10 Pain Score: 7  Pain Location: L knee Pain Descriptors / Indicators: Sore Pain Intervention(s): Limited activity within patient's tolerance;Monitored during session;Premedicated before session;Repositioned (PT came in right after OT)     Hand Dominance     Extremity/Trunk Assessment Upper Extremity Assessment Upper Extremity Assessment: Overall WFL for tasks assessed      Cervical / Trunk Assessment Cervical / Trunk Assessment: Kyphotic   Communication Communication Communication: No difficulties   Cognition Arousal/Alertness: Awake/alert Behavior During Therapy: WFL for tasks assessed/performed Overall Cognitive Status: Within Functional Limits for tasks assessed                     General Comments       Exercises       Shoulder Instructions      Home Living Family/patient expects to be discharged to:: Private residence Living Arrangements: Children Available Help at Discharge: Family Type of Home: House Home Access: Stairs to enter Technical brewer of Steps: 1   Home Layout: One level     Bathroom  Shower/Tub: Walk-in shower (pt often sponge bathes)   Bathroom Toilet: Standard     Home Equipment: Environmental consultant - 2 wheels   Additional Comments: 3;1 delivered to room      Prior Functioning/Environment Level of Independence: Independent with assistive device(s)                 OT Problem List: Decreased strength;Decreased activity tolerance;Pain;Decreased knowledge of use of DME or AE   OT Treatment/Interventions: Self-care/ADL training;DME and/or AE instruction;Patient/family education    OT Goals(Current goals can be found in the care plan section) Acute Rehab OT Goals Patient Stated Goal: to get the other knee surgery OT Goal Formulation: With patient Time For Goal Achievement:  10/15/16 Potential to Achieve Goals: Good ADL Goals Pt Will Transfer to Toilet: with min guard assist;ambulating;bedside commode Pt Will Perform Toileting - Clothing Manipulation and hygiene: with min guard assist;sit to/from stand Pt Will Perform Tub/Shower Transfer: Shower transfer;with min guard assist;ambulating;3 in 1  OT Frequency: Min 2X/week   Barriers to D/C:            Co-evaluation              End of Session    Activity Tolerance: Patient tolerated treatment well Patient left: in chair;with call bell/phone within reach;with family/visitor present   Time: 1353-1413 OT Time Calculation (min): 20 min Charges:  OT General Charges $OT Visit: 1 Procedure OT Evaluation $OT Eval Low Complexity: 1 Procedure G-Codes:    Missael Ferrari 11-03-2016, 3:03 PM  Lesle Chris, OTR/L [phone] 2016-11-03

## 2016-10-08 NOTE — Progress Notes (Addendum)
Spoke with patient and daughter at bedside. Patient will d/c home with daughter post hospitalization. Per daughter, plan is for OP PT. She is requesting 3n1 and semi-electric hospital bed. Encouraged her to discuss with surgeon to assess if she qualifies, will have [facility] speak with them regarding types of beds available. Will continue to follow for d/c needs.

## 2016-10-08 NOTE — Progress Notes (Signed)
Physical Therapy Treatment Patient Details Name: Tracy Faulkner MRN: [id number] DOB: 07-10-1932 Today's Date: 10/08/2016    History of Present Illness L TKA, received 2 units of blood post op    PT Comments    The  Patient is progressing very well. Plans Dc home with OPPT.  Follow Up Recommendations  Outpatient PT;Supervision/Assistance - 24 hour     Equipment Recommendations  None recommended by PT    Recommendations for Other Services       Precautions / Restrictions Precautions Precautions: Fall;Knee Restrictions LLE Weight Bearing: Weight bearing as tolerated    Mobility  Bed Mobility Overal bed mobility: Needs Assistance Bed Mobility: Sit to Supine     Supine to sit:  (by PT) Sit to supine: Independent   General bed mobility comments: PATIENT ABLE TO PLACE LEFT LEG ONTO BED  Transfers Overall transfer level: Needs assistance Equipment used: Rolling walker (2 wheeled) Transfers: Sit to/from Stand Sit to Stand: Min assist;Mod assist         General transfer comment: cues for hand placement  and Left leg position, assist to power up to stand.  Pt rocks for momentum to stand, decreased control odf descent. Patient reports she flops'  Ambulation/Gait Ambulation/Gait assistance: Min assist Ambulation Distance (Feet): 100 Feet Assistive device: Rolling walker (2 wheeled) Gait Pattern/deviations: Step-through pattern Gait velocity: decreased   General Gait Details: cues for sequence and position inside the RW   Stairs            Wheelchair Mobility    Modified Rankin (Stroke Patients Only)       Balance                                    Cognition Arousal/Alertness: Awake/alert Behavior During Therapy: WFL for tasks assessed/performed Overall Cognitive Status: Within Functional Limits for tasks assessed                      Exercises Total Joint Exercises Ankle Circles/Pumps: AROM;10 reps;Supine Quad Sets:  AROM;10 reps;Supine Short Arc Quad: AAROM;Left;10 reps;Supine Heel Slides: AAROM;Left;10 reps;Supine Hip ABduction/ADduction: AAROM;Left;10 reps;Supine Straight Leg Raises: AAROM;Left;10 reps;Supine    General Comments        Pertinent Vitals/Pain Pain Assessment: 0-10 Pain Score: 4  Pain Location: l KNEE Pain Descriptors / Indicators: Discomfort;Sore Pain Intervention(s): Limited activity within patient's tolerance;Monitored during session;Premedicated before session;Repositioned (PT came in right after OT)    Home Living Family/patient expects to be discharged to:: Private residence Living Arrangements: Children Available Help at Discharge: Family Type of Home: House Home Access: Stairs to enter   Home Layout: One level Home Equipment: Environmental consultant - 2 wheels Additional Comments: 3;1 delivered to room    Prior Function Level of Independence: Independent with assistive device(s)          PT Goals (current goals can now be found in the care plan section) Acute Rehab PT Goals Patient Stated Goal: to get the other knee surgery PT Goal Formulation: With patient/family Time For Goal Achievement: 10/12/16 Potential to Achieve Goals: Good Progress towards PT goals: Progressing toward goals    Frequency    7X/week      PT Plan Current plan remains appropriate    Co-evaluation             End of Session   Activity Tolerance: Patient tolerated treatment well Patient left: in bed;with call bell/phone within  reach;with bed alarm set;with family/visitor present     Time: 1413-1440 PT Time Calculation (min) (ACUTE ONLY): 27 min  Charges:  $Gait Training: 23-37 mins $Therapeutic Exercise: 8-22 mins                    G Codes:      Claretha Cooper 10/08/2016, 3:30 PM

## 2016-10-08 NOTE — Discharge Summary (Signed)
Physician Discharge Summary  Patient ID: Tracy Faulkner MRN: [id number] DOB/AGE: [birth year] 82 y.o.  Admit date: 10/07/2016 Discharge date: 10/09/2016  Admission Diagnoses:  Degenerative arthritis of left knee  Discharge Diagnoses:  Principal Problem:   Degenerative arthritis of left knee   Past Medical History:  Diagnosis Date  . Anemia   . Arthritis   . Ataxia   . Back problem   . Bowel trouble   . Breast cancer ([place])   . Carpal tunnel syndrome   . Constipation   . Disturbance, sleep   . Dyspnea    increased exertion   . Frequent headaches   . History of urinary tract infection   . Hypertension   . Obesity   . Pain in both knees   . Urinary incontinence     Surgeries: Procedure(s): LEFT TOTAL KNEE ARTHROPLASTY WITH COMPUTER NAVIGATION on 10/07/2016   Consultants (if any):   Discharged Condition: Improved  Hospital Course: Tracy Faulkner is an 82 y.o. female who was admitted 10/07/2016 with a diagnosis of Degenerative arthritis of left knee and went to the operating room on 10/07/2016 and underwent the above named procedures.    She was given perioperative antibiotics:  Anti-infectives    Start     Dose/Rate Route Frequency Ordered Stop   10/08/16 0600  vancomycin (VANCOCIN) IVPB 1000 mg/200 mL premix     1,000 mg 200 mL/hr over 60 Minutes Intravenous On call to O.R. 10/07/16 1404 10/07/16 1619   10/08/16 0400  vancomycin (VANCOCIN) IVPB 1000 mg/200 mL premix     1,000 mg 200 mL/hr over 60 Minutes Intravenous Every 12 hours 10/07/16 2112 10/08/16 0527    .  She was given sequential compression devices, early ambulation, and apixaban for DVT prophylaxis.  She benefited maximally from the hospital stay and there were no complications.    Recent vital signs:  Vitals:   10/09/16 0655 10/09/16 0859  BP: 118/64 135/69  Pulse: 72 96  Resp: 16   Temp: 98 F (36.7 C)     Recent laboratory studies:  Lab Results  Component Value Date   HGB 9.1 (L)  10/09/2016   HGB 8.5 (L) 10/08/2016   HGB 10.3 (L) 10/01/2016   Lab Results  Component Value Date   WBC 11.8 (H) 10/09/2016   PLT 162 10/09/2016   No results found for: INR Lab Results  Component Value Date   NA 141 10/08/2016   K 4.0 10/08/2016   CL 114 (H) 10/08/2016   CO2 22 10/08/2016   BUN 23 (H) 10/08/2016   CREATININE 1.20 (H) 10/08/2016   GLUCOSE 112 (H) 10/08/2016    Discharge Medications:     Medication List    STOP taking these medications   acetaminophen 650 MG CR tablet Commonly known as:  TYLENOL   aspirin EC 81 MG tablet   piroxicam 20 MG capsule Commonly known as:  FELDENE   sulfamethoxazole-trimethoprim 800-160 MG tablet Commonly known as:  BACTRIM DS,SEPTRA DS   traMADol 50 MG tablet Commonly known as:  ULTRAM     TAKE these medications   amLODipine 10 MG tablet Commonly known as:  NORVASC Take 10 mg by mouth every evening.   apixaban 2.5 MG Tabs tablet Commonly known as:  ELIQUIS Take 1 tablet (2.5 mg total) by mouth 2 (two) times daily.   BIOTIN PO Take 1 tablet by mouth daily.   butalbital-acetaminophen-caffeine 50-325-40 MG tablet Commonly known as:  FIORICET, ESGIC Take 1 tablet by mouth  every 6 (six) hours as needed for headache.   docusate sodium 100 MG capsule Commonly known as:  COLACE Take 1 capsule (100 mg total) by mouth 2 (two) times daily.   FISH OIL PO Take 2 capsules by mouth daily.   HYDROcodone-acetaminophen 5-325 MG tablet Commonly known as:  NORCO/VICODIN Take 1-2 tablets by mouth every 4 (four) hours as needed (breakthrough pain).   methocarbamol 500 MG tablet Commonly known as:  ROBAXIN Take 1 tablet (500 mg total) by mouth every 6 (six) hours as needed for muscle spasms.   mirtazapine 15 MG tablet Commonly known as:  REMERON Take 7.5 mg by mouth at bedtime.   multivitamin tablet Take 1 tablet by mouth daily.   ondansetron 4 MG tablet Commonly known as:  ZOFRAN Take 1 tablet (4 mg total) by mouth  every 6 (six) hours as needed for nausea.   OVER THE COUNTER MEDICATION Take 1 tablet by mouth daily. Med Name: HAIR VITAMINS   RESTASIS OP Place 1 drop into both eyes 2 (two) times daily.   senna 8.6 MG Tabs tablet Commonly known as:  SENOKOT Take 2 tablets (17.2 mg total) by mouth at bedtime.   valsartan-hydrochlorothiazide 160-12.5 MG tablet Commonly known as:  DIOVAN-HCT Take 1 tablet by mouth daily.   Vitamin D3 10000 units Tabs Take 1,000 Units by mouth daily.       Diagnostic Studies: Dg Knee Left Port  Result Date: 10/07/2016 CLINICAL DATA:  Postoperative evaluation for left total knee arthroplasty. EXAM: PORTABLE LEFT KNEE - 1-2 VIEW COMPARISON:  None available. FINDINGS: A left total knee arthroplasties in place. The femoral and tibial components appear well seated in normal alignment. No periprosthetic fracture or other complication. Postoperative soft tissue swelling and emphysema present about the knee. Surgical drain in place. IMPRESSION: Postoperative changes from recent left total knee arthroplasty without complication. Electronically Signed   By: Jeannine Boga M.D.   On: 10/07/2016 20:33    Disposition: 01-Home or Self Care  Discharge Instructions    Call MD / Call 911    Complete by:  As directed    If you experience chest pain or shortness of breath, CALL 911 and be transported to the hospital emergency room.  If you develope a fever above 101 F, pus (white drainage) or increased drainage or redness at the wound, or calf pain, call your surgeon's office.   Constipation Prevention    Complete by:  As directed    Drink plenty of fluids.  Prune juice may be helpful.  You may use a stool softener, such as Colace (over the counter) 100 mg twice a day.  Use MiraLax (over the counter) for constipation as needed.   Diet - low sodium heart healthy    Complete by:  As directed    Do not put a pillow under the knee. Place it under the heel.    Complete by:  As  directed    Driving restrictions    Complete by:  As directed    No driving for 6 weeks   Increase activity slowly as tolerated    Complete by:  As directed    Lifting restrictions    Complete by:  As directed    No lifting for 6 weeks   TED hose    Complete by:  As directed    Use stockings (TED hose) for 2 weeks on both leg(s).  You may remove them at night for sleeping.  Follow-up Information    Danzell Birky, Horald Pollen, MD. Schedule an appointment as soon as possible for a visit in 2 week(s).   Specialty:  Orthopedic Surgery Why:  For wound re-check Contact information: [address] [place] [address] [phone]            Signed: Elie Goody 10/11/2016, 6:40 PM

## 2016-10-08 NOTE — Discharge Instructions (Addendum)
°Dr. Brian Swinteck °Total Joint Specialist °[facility] °[address] °[place], [place] [address] °[phone] ° °TOTAL KNEE REPLACEMENT POSTOPERATIVE DIRECTIONS ° ° ° °Knee Rehabilitation, Guidelines Following Surgery  °Results after knee surgery are often greatly improved when you follow the exercise, range of motion and muscle strengthening exercises prescribed by your doctor. Safety measures are also important to protect the knee from further injury. Any time any of these exercises cause you to have increased pain or swelling in your knee joint, decrease the amount until you are comfortable again and slowly increase them. If you have problems or questions, call your caregiver or physical therapist for advice.  ° °WEIGHT BEARING °Weight bearing as tolerated with assist device (walker, cane, etc) as directed, use it as long as suggested by your surgeon or therapist, typically at least 4-6 weeks. ° °HOME CARE INSTRUCTIONS  °Remove items at home which could result in a fall. This includes throw rugs or furniture in walking pathways.  °Continue medications as instructed at time of discharge. °You may have some home medications which will be placed on hold until you complete the course of blood thinner medication.  °You may start showering once you are discharged home but do not submerge the incision under water. Just pat the incision dry and apply a dry gauze dressing on daily. °Walk with walker as instructed.  °You may resume a sexual relationship in one month or when given the OK by your doctor.  °· Use walker as long as suggested by your caregivers. °· Avoid periods of inactivity such as sitting longer than an hour when not asleep. This helps prevent blood clots.  °You may put full weight on your legs and walk as much as is comfortable.  °You may return to work once you are cleared by your doctor.  °Do not drive a car for 6 weeks or until released by you surgeon.  °· Do not drive while  taking narcotics.  °Wear the elastic stockings for three weeks following surgery during the day but you may remove then at night. °Make sure you keep all of your appointments after your operation with all of your doctors and caregivers. You should call the office at the above phone number and make an appointment for approximately two weeks after the date of your surgery. °Do not remove your surgical dressing. The dressing is waterproof; you may take showers in 3 days, but do not take tub baths or submerge the dressing. °Please pick up a stool softener and laxative for home use as long as you are requiring pain medications. °· ICE to the affected knee every three hours for 30 minutes at a time and then as needed for pain and swelling.  Continue to use ice on the knee for pain and swelling from surgery. You may notice swelling that will progress down to the foot and ankle.  This is normal after surgery.  Elevate the leg when you are not up walking on it.   °It is important for you to complete the blood thinner medication as prescribed by your doctor. °· Continue to use the breathing machine which will help keep your temperature down.  It is common for your temperature to cycle up and down following surgery, especially at night when you are not up moving around and exerting yourself.  The breathing machine keeps your lungs expanded and your temperature down. ° °RANGE OF MOTION AND STRENGTHENING EXERCISES  °Rehabilitation of the knee is important following a   knee injury or an operation. After just a few days of immobilization, the muscles of the thigh which control the knee become weakened and shrink (atrophy). Knee exercises are designed to build up the tone and strength of the thigh muscles and to improve knee motion. Often times heat used for twenty to thirty minutes before working out will loosen up your tissues and help with improving the range of motion but do not use heat for the first two weeks following  surgery. These exercises can be done on a training (exercise) mat, on the floor, on a table or on a bed. Use what ever works the best and is most comfortable for you Knee exercises include:  Leg Lifts - While your knee is still immobilized in a splint or cast, you can do straight leg raises. Lift the leg to 60 degrees, hold for 3 sec, and slowly lower the leg. Repeat 10-20 times 2-3 times daily. Perform this exercise against resistance later as your knee gets better.  Quad and Hamstring Sets - Tighten up the muscle on the front of the thigh (Quad) and hold for 5-10 sec. Repeat this 10-20 times hourly. Hamstring sets are done by pushing the foot backward against an object and holding for 5-10 sec. Repeat as with quad sets.  A rehabilitation program following serious knee injuries can speed recovery and prevent re-injury in the future due to weakened muscles. Contact your doctor or a physical therapist for more information on knee rehabilitation.   SKILLED REHAB INSTRUCTIONS: If the patient is transferred to a skilled rehab facility following release from the hospital, a list of the current medications will be sent to the facility for the patient to continue.  When discharged from the skilled rehab facility, please have the facility set up the patient's [place] prior to being released. Also, the skilled facility will be responsible for providing the patient with their medications at time of release from the facility to include their pain medication, the muscle relaxants, and their blood thinner medication. If the patient is still at the rehab facility at time of the two week follow up appointment, the skilled rehab facility will also need to assist the patient in arranging follow up appointment in our office and any transportation needs.  MAKE SURE YOU:  Understand these instructions.  Will watch your condition.  Will get help right away if you are not doing well or get worse.     Pick up stool softner and laxative for home use following surgery while on pain medications. Do NOT remove your dressing. You may shower.  Do not take tub baths or submerge incision under water. May shower starting three days after surgery. Please use a clean towel to pat the incision dry following showers. Continue to use ice for pain and swelling after surgery. Do not use any lotions or creams on the incision until instructed by your surgeon.  Information on my medicine - ELIQUIS (apixaban)  This medication education was reviewed with me or my healthcare representative as part of my discharge preparation.  The pharmacist that spoke with me during my hospital stay was:  Darria Corvera, [facility]  Why was Eliquis prescribed for you? Eliquis was prescribed for you to reduce the risk of blood clots forming after orthopedic surgery.    What do You need to know about Eliquis? Take your Eliquis TWICE DAILY - one tablet in the morning and one tablet in the evening with or without food.  It would  be best to take the dose about the same time each day.  If you have difficulty swallowing the tablet whole please discuss with your pharmacist how to take the medication safely.  Take Eliquis exactly as prescribed by your doctor and DO NOT stop taking Eliquis without talking to the doctor who prescribed the medication.  Stopping without other medication to take the place of Eliquis may increase your risk of developing a clot.  After discharge, you should have regular check-up appointments with your healthcare provider that is prescribing your Eliquis.  What do you do if you miss a dose? If a dose of ELIQUIS is not taken at the scheduled time, take it as soon as possible on the same day and twice-daily administration should be resumed.  The dose should not be doubled to make up for a missed dose.  Do not take more than one tablet of ELIQUIS at the same time.  Important Safety Information A  possible side effect of Eliquis is bleeding. You should call your healthcare provider right away if you experience any of the following: ? Bleeding from an injury or your nose that does not stop. ? Unusual colored urine (red or dark brown) or unusual colored stools (red or black). ? Unusual bruising for unknown reasons. ? A serious fall or if you hit your head (even if there is no bleeding).  Some medicines may interact with Eliquis and might increase your risk of bleeding or clotting while on Eliquis. To help avoid this, consult your healthcare provider or pharmacist prior to using any new prescription or non-prescription medications, including herbals, vitamins, non-steroidal anti-inflammatory drugs (NSAIDs) and supplements.  This website has more information on Eliquis (apixaban): http://www.eliquis.com/eliquis/home

## 2016-10-08 NOTE — Evaluation (Signed)
Physical Therapy Evaluation Patient Details Name: Tracy Faulkner MRN: [id number] DOB: 12/09/1931 Today's Date: 10/08/2016   History of Present Illness  L TKA, received 2 units of blood post op  Clinical Impression  The  Patient  Tolerated ambulating x 50'. Plans to go to daughter's house. Pt admitted with above diagnosis. Pt currently with functional limitations due to the deficits listed below (see PT Problem List).  Pt will benefit from skilled PT to increase their independence and safety with mobility to allow discharge to the venue listed below.       Follow Up Recommendations Outpatient PT;Supervision/Assistance - 24 hour    Equipment Recommendations  None recommended by PT    Recommendations for Other Services       Precautions / Restrictions Precautions Precautions: Fall;Knee      Mobility  Bed Mobility Overal bed mobility: Needs Assistance Bed Mobility: Supine to Sit     Supine to sit: Min assist     General bed mobility comments: assist with trunk  Transfers Overall transfer level: Needs assistance Equipment used: Rolling walker (2 wheeled) Transfers: Sit to/from Stand Sit to Stand: Min assist;From elevated surface         General transfer comment: cues for hand placement  and Left leg position, assist to power up to stand./  Ambulation/Gait Ambulation/Gait assistance: Min assist Ambulation Distance (Feet): 50 Feet Assistive device: Rolling walker (2 wheeled) Gait Pattern/deviations: Step-to pattern;Trunk flexed Gait velocity: decreased   General Gait Details: cues for sequence and position inside the RW  Stairs            Wheelchair Mobility    Modified Rankin (Stroke Patients Only)       Balance                                             Pertinent Vitals/Pain Pain Assessment: 0-10 Pain Score: 3  Pain Location: L knee Pain Descriptors / Indicators: Sore Pain Intervention(s): Monitored during  session;Premedicated before session;Repositioned;Ice applied    Home Living Family/patient expects to be discharged to:: Private residence Living Arrangements: Children Available Help at Discharge: Family Type of Home: House Home Access: Stairs to enter   Technical brewer of Steps: 1 Home Layout: One level Home Equipment: Environmental consultant - 2 wheels      Prior Function Level of Independence: Independent with assistive device(s)               Hand Dominance        Extremity/Trunk Assessment               Lower Extremity Assessment: LLE deficits/detail   LLE Deficits / Details: performs SLR, knee flexion 10-50  Cervical / Trunk Assessment: Kyphotic  Communication   Communication: No difficulties  Cognition Arousal/Alertness: Awake/alert Behavior During Therapy: WFL for tasks assessed/performed Overall Cognitive Status: Within Functional Limits for tasks assessed                      General Comments      Exercises Total Joint Exercises Ankle Circles/Pumps: AROM;10 reps;Supine Quad Sets: AROM;10 reps;Supine Short Arc Quad: AAROM;Left;10 reps;Supine Heel Slides: AAROM;Left;10 reps;Supine Hip ABduction/ADduction: AAROM;Left;10 reps;Supine Straight Leg Raises: AAROM;Left;10 reps;Supine   Assessment/Plan    PT Assessment Patient needs continued PT services  PT Problem List Decreased strength;Decreased range of motion;Decreased activity tolerance;Decreased mobility;Decreased knowledge of precautions;Decreased safety  awareness;Decreased knowledge of use of DME;Pain          PT Treatment Interventions DME instruction;Gait training;Stair training;Functional mobility training;Therapeutic activities;Therapeutic exercise;Patient/family education    PT Goals (Current goals can be found in the Care Plan section)  Acute Rehab PT Goals Patient Stated Goal: to get the other knee surgery PT Goal Formulation: With patient/family Time For Goal Achievement:  10/12/16 Potential to Achieve Goals: Good    Frequency 7X/week   Barriers to discharge        Co-evaluation               End of Session   Activity Tolerance: Patient tolerated treatment well Patient left: in chair;with call bell/phone within reach;with family/visitor present Nurse Communication: Mobility status         Time: 1031-1110 PT Time Calculation (min) (ACUTE ONLY): 39 min   Charges:   PT Evaluation $PT Eval Low Complexity: 1 Procedure PT Treatments $Gait Training: 8-22 mins $Therapeutic Exercise: 8-22 mins   PT G Codes:        Claretha Cooper 10/08/2016, 1:36 PM Tresa Endo PT [phone]

## 2016-10-09 LAB — CBC
HEMATOCRIT: 27.5 % — AB (ref 36.0–46.0)
HEMOGLOBIN: 9.1 g/dL — AB (ref 12.0–15.0)
MCH: 26.5 pg (ref 26.0–34.0)
MCHC: 33.1 g/dL (ref 30.0–36.0)
MCV: 80.2 fL (ref 78.0–100.0)
Platelets: 162 10*3/uL (ref 150–400)
RBC: 3.43 MIL/uL — AB (ref 3.87–5.11)
RDW: 15.7 % — ABNORMAL HIGH (ref 11.5–15.5)
WBC: 11.8 10*3/uL — AB (ref 4.0–10.5)

## 2016-10-09 NOTE — Progress Notes (Signed)
Physical Therapy Treatment Patient Details Name: SAYWARD FOUTZ MRN: [id number] DOB: [birth year] Today's Date: 10/09/2016    History of Present Illness L TKA, received 2 units of blood post op   PT Comments    Will see 1 more visit prior to DC.  Follow Up Recommendations  Outpatient PT;Supervision/Assistance - 24 hour     Equipment Recommendations  None recommended by PT    Recommendations for Other Services       Precautions / Restrictions Precautions Precautions: Fall;Knee    Mobility  Bed Mobility               General bed mobility comments: in recliner  Transfers Overall transfer level: Needs assistance Equipment used: Rolling walker (2 wheeled) Transfers: Sit to/from Stand Sit to Stand: Min assist         General transfer comment: cues for hand placement  and Left leg position, assist to power up to stand.  decreased control of descent  Ambulation/Gait                 Stairs Stairs: Yes Stairs assistance: +2 safety/equipment Stair Management: Step to pattern;Backwards Number of Stairs: 1 General stair comments: patient had much difficulty stepping up to the 1 step. daughter states the step at home is half the height.  she should do OK on  a 3 inch step.  Wheelchair Mobility    Modified Rankin (Stroke Patients Only)       Balance                                    Cognition Arousal/Alertness: Awake/alert                          Exercises Total Joint Exercises Ankle Circles/Pumps: AROM;Both;20 reps Quad Sets: AROM;Both;20 reps Towel Squeeze: AROM;Both;20 reps Short Arc Quad: AROM;Both;20 reps Heel Slides: AAROM;Both;20 reps Hip ABduction/ADduction: AAROM;Both;20 reps Straight Leg Raises: AAROM;Both;20 reps Goniometric ROM: 0-70 knee flexion    General Comments        Pertinent Vitals/Pain Pain Score: 3  Pain Location: left knee Pain Descriptors / Indicators: Discomfort Pain Intervention(s):  Premedicated before session;Repositioned;Ice applied    Home Living                      Prior Function            PT Goals (current goals can now be found in the care plan section) Progress towards PT goals: Progressing toward goals    Frequency    7X/week      PT Plan Current plan remains appropriate    Co-evaluation             End of Session   Activity Tolerance: Patient tolerated treatment well Patient left: in chair;with call bell/phone within reach;with family/visitor present     Time: 1032-1120 PT Time Calculation (min) (ACUTE ONLY): 48 min  Charges:  $Gait Training: 8-22 mins $Therapeutic Exercise: 8-22 mins $Self Care/Home Management: 2023/08/03                    G Codes:      Claretha Cooper 10/09/2016, 1:17 PM

## 2016-10-09 NOTE — Progress Notes (Signed)
Pt to d/c home. Outpatient therapy already scheduled for Monday. Required DME delivered to room prior to d/c. AVS reviewed and "My Chart" discussed with pt. Pt capable of verbalizing medications, signs and symptoms of infection, and follow-up appointments. Remains hemodynamically stable. No signs and symptoms of distress. Educated pt to return to ER in the case of SOB, dizziness, or chest pain.

## 2016-10-09 NOTE — Progress Notes (Signed)
Occupational Therapy Treatment Patient Details Name: Tracy Faulkner MRN: [id number] DOB: 1932/09/13 Today's Date: 10/09/2016    History of present illness L TKA, received 2 units of blood post op   OT comments  Pt limited by pain during part of session but did seem to improve as session progressed. Caregiver present for education. Pt needing max assist with periarea care after toileting and educated on how caregiver should assist with ADL and stand next to pt for safety during ADL tasks.  Will continue to follow for acute OT.    Follow Up Recommendations  Supervision/Assistance - 24 hour    Equipment Recommendations  3 in 1 bedside comode (in room)    Recommendations for Other Services      Precautions / Restrictions Precautions Precautions: Fall;Knee Restrictions Weight Bearing Restrictions: No LLE Weight Bearing: Weight bearing as tolerated       Mobility Bed Mobility Overal bed mobility: Needs Assistance Bed Mobility: Supine to Sit     Supine to sit: Min assist     General bed mobility comments: for L LE over to EOB.  Transfers Overall transfer level: Needs assistance Equipment used: Rolling walker (2 wheeled) Transfers: Sit to/from Stand Sit to Stand: Min assist         General transfer comment: cues for hand placement and L LE management. Pt did well with descending to chair on first attempt but plopped on second attempt.    Balance                                   ADL Overall ADL's : Needs assistance/impaired                         Toilet Transfer: Minimal assistance;Stand-pivot;RW;BSC   Toileting- Clothing Manipulation and Hygiene: Maximal assistance;Sit to/from stand         General ADL Comments: Pt needing to urinate urgently when OT arrived. Assisted to [facility] with walker via a stand pivot transfer. Pt needing mod cues for hand placement and overall safety. Caregiver present for session. Pt needing max assist for toilet  hygiene as she couldnt let go of walker to manage own hygiene initially as she was hurting so much. Later in session pt did better with letting go of walker to help pull up Depends. Educated pt and caregiver on shower transfer safety and issued handout but advised pt to sponge bathe initially as she does fatigue some with activity and isnt picking up L LE well enough this am to attempt a shower transfer. Pt states she is ok to sponge bathe initially. Educated on AE options and sequence for LB dressing. Discussed 3in1 for over toilet and in shower.       Vision                     Perception     Praxis      Cognition   Behavior During Therapy: [facility] for tasks assessed/performed Overall Cognitive Status: Within Functional Limits for tasks assessed                       Extremity/Trunk Assessment                  Shoulder Instructions       General Comments      Pertinent Vitals/ Pain  Pain Assessment: 0-10 Pain Score: 3  Pain Location: left knee Pain Descriptors / Indicators: Discomfort Pain Intervention(s): Premedicated before session;Repositioned;Ice applied  Home Living                                          Prior Functioning/Environment              Frequency  Min 2X/week        Progress Toward Goals  OT Goals(current goals can now be found in the care plan section)  Progress towards OT goals: Progressing toward goals     Plan Discharge plan remains appropriate    Co-evaluation                 End of Session Equipment Utilized During Treatment: Rolling walker   Activity Tolerance Patient limited by pain   Patient Left in chair;with call bell/phone within reach;with family/visitor present   Nurse Communication          Time: 0930-1020 OT Time Calculation (min): 50 min  Charges: OT General Charges $OT Visit: 1 Procedure OT Treatments $Self Care/Home Management : 8-22 mins $Therapeutic  Activity: 23-37 mins  Jules Schick 10/09/2016, 1:38 PM

## 2016-10-09 NOTE — Progress Notes (Signed)
   Subjective: 2 Days Post-Op Procedure(s) (LRB): LEFT TOTAL KNEE ARTHROPLASTY WITH COMPUTER NAVIGATION (Left) Patient reports pain as mild.   Patient seen in rounds with Dr. Gladstone Lighter, for Dr. Lyla Glassing. Patient is well, and has had no acute complaints or problems. No issues overnight. No SOB or chest pain. Reports that she is feeling ready to go home.    Objective: Vital signs in last 24 hours: Temp:  [98 F (36.7 C)-98.5 F (36.9 C)] 98 F (36.7 C) (11/11 0655) Pulse Rate:  [72-96] 96 (11/11 0859) Resp:  [16] 16 (11/11 0655) BP: (112-145)/(64-82) 135/69 (11/11 0859) SpO2:  [96 %-100 %] 100 % (11/11 0859)  Intake/Output from previous day:  Intake/Output Summary (Last 24 hours) at 10/09/16 0918 Last data filed at 10/09/16 0833  Gross per 24 hour  Intake             2215 ml  Output             1926 ml  Net              289 ml    Intake/Output this shift: Total I/O In: 240 [P.O.:240] Out: 300 [Urine:300]  Labs:  Recent Labs  10/08/16 0411 10/09/16 0439  HGB 8.5* 9.1*    Recent Labs  10/08/16 0411 10/09/16 0439  WBC 8.5 11.8*  RBC 3.35* 3.43*  HCT 26.6* 27.5*  PLT 125* 162    Recent Labs  10/08/16 0411  NA 141  K 4.0  CL 114*  CO2 22  BUN 23*  CREATININE 1.20*  GLUCOSE 112*  CALCIUM 8.1*    EXAM General - Patient is Alert and Oriented Extremity - Neurologically intact Intact pulses distally Dorsiflexion/Plantar flexion intact No cellulitis present Compartment soft Dressing/Incision - clean, dry, no drainage Motor Function - intact, moving foot and toes well on exam.   Past Medical History:  Diagnosis Date  . Anemia   . Arthritis   . Ataxia   . Back problem   . Bowel trouble   . Breast cancer ([place])   . Carpal tunnel syndrome   . Constipation   . Disturbance, sleep   . Dyspnea    increased exertion   . Frequent headaches   . History of urinary tract infection   . Hypertension   . Obesity   . Pain in both knees   . Urinary  incontinence     Assessment/Plan: 2 Days Post-Op Procedure(s) (LRB): LEFT TOTAL KNEE ARTHROPLASTY WITH COMPUTER NAVIGATION (Left) Principal Problem:   Degenerative arthritis of left knee  Estimated body mass index is 36.44 kg/m as calculated from the following:   Height as of this encounter: 5\' 5"  (1.651 m).   Weight as of this encounter: 99.3 kg (219 lb). Advance diet Up with therapy Discharge home with home health  DVT Prophylaxis - Eliquis Weight-Bearing as tolerated   Will continue with therapy today. DC home today with HHPT. Discharge instructions given.   Ardeen Jourdain, PA-C Orthopaedic Surgery 10/09/2016, 9:18 AM

## 2016-10-09 NOTE — Care Management Note (Signed)
Case Management Note  Patient Details  Name: Tracy Faulkner MRN: [id number] Date of Birth: Feb 01, 1932  Subjective/Objective:   Left TKA                 Action/Plan: Discharge Planning: AVS reviewed:   NCM spoke to pt and dtr, [name]. Pt scheduled to go to outpt PT at [facility]. 3n1 bedside commode delivered to room by [facility]. She has RW. Dtr will discuss with PCP about hospital bed. Provided dtr with list of DME agency.    Expected Discharge Date:  10/09/2016             Expected Discharge Plan:  Home/Self Care  In-House Referral:  NA  Discharge planning Services  CM Consult  Post Acute Care Choice:  Home Health, Durable Medical Equipment Choice offered to:  Adult Children  DME Arranged:  3-N-1 DME Agency:  [place] Arranged:  NA (Per daughter, OP PT) [place] Agency:  Other - See comment (Per daughter, OP PT)  Status of Service:  Completed, signed off  If discussed at [place] of Stay Meetings, dates discussed:    Additional Comments:  Erenest Rasher, RN 10/09/2016, 10:48 AM

## 2016-10-09 NOTE — Progress Notes (Signed)
Physical Therapy Treatment Patient Details Name: Tracy Faulkner MRN: [id number] DOB: January 28, 1932 Today's Date: 10/09/2016    History of Present Illness L TKA, received 2 units of blood post op    PT Comments    Patient is ready for DC. All education completed.  Follow Up Recommendations  Outpatient PT;Supervision/Assistance - 24 hour     Equipment Recommendations  None recommended by PT    Recommendations for Other Services       Precautions / Restrictions Precautions Precautions: Fall;Knee Restrictions Weight Bearing Restrictions: No LLE Weight Bearing: Weight bearing as tolerated    Mobility  Bed Mobility Overal bed mobility: Needs Assistance Bed Mobility: Supine to Sit     Supine to sit: Min assist     General bed mobility comments: in recliner  Transfers Overall transfer level: Needs assistance Equipment used: Rolling walker (2 wheeled) Transfers: Sit to/from Stand Sit to Stand: Min assist;Mod assist         General transfer comment: cues for hand placement and L LE management. Pt did well with descending to chair on first attempt but plopped on second attempt.  Ambulation/Gait Ambulation/Gait assistance: Min assist Ambulation Distance (Feet): 20 Feet (x 2) Assistive device: Rolling walker (2 wheeled) Gait Pattern/deviations: Step-through pattern;Step-to pattern Gait velocity: decreased   General Gait Details: cues for sequence and position inside the RW   Stairs Stairs: Yes Stairs assistance: +2 safety/equipment Stair Management: Step to pattern;Backwards Number of Stairs: 1 General stair comments: patient had much difficulty stepping up to the 1 step. daughter states the step at home is half the height.  she should do OK on  a 3 inch step.  Wheelchair Mobility    Modified Rankin (Stroke Patients Only)       Balance                                    Cognition Arousal/Alertness: Awake/alert Behavior During Therapy:  WFL for tasks assessed/performed Overall Cognitive Status: Within Functional Limits for tasks assessed                      Exercises Total Joint Exercises Ankle Circles/Pumps: AROM;Both;20 reps Quad Sets: AROM;Both;20 reps Towel Squeeze: AROM;Both;20 reps Short Arc Quad: AROM;Both;20 reps Heel Slides: AAROM;Both;20 reps Hip ABduction/ADduction: AAROM;Both;20 reps Straight Leg Raises: AAROM;Both;20 reps Goniometric ROM: 0-70 knee flexion    General Comments        Pertinent Vitals/Pain Pain Score: 2  Pain Location: left knee Pain Descriptors / Indicators: Spasm Pain Intervention(s): Patient requesting pain meds-RN notified    Home Living                      Prior Function            PT Goals (current goals can now be found in the care plan section) Progress towards PT goals: Progressing toward goals    Frequency    7X/week      PT Plan Current plan remains appropriate    Co-evaluation             End of Session   Activity Tolerance: Patient tolerated treatment well Patient left: in chair;with call bell/phone within reach;with nursing/sitter in room     Time: 1329-1355 PT Time Calculation (min) (ACUTE ONLY): 26 min  Charges:  $Gait Training: 8-22 mins $Therapeutic Exercise: 8-22 mins $Self Care/Home Management: 8-22  G Codes:      Claretha Cooper 10/09/2016, 2:26 PM

## 2016-10-11 ENCOUNTER — Encounter (HOSPITAL_COMMUNITY): Payer: Self-pay | Admitting: Orthopedic Surgery

## 2016-10-11 LAB — TYPE AND SCREEN
ABO/RH(D): O POS
Antibody Screen: NEGATIVE
UNIT DIVISION: 0
Unit division: 0

## 2016-10-11 NOTE — Anesthesia Postprocedure Evaluation (Signed)
Anesthesia Post Note  Patient: Tracy Faulkner  Procedure(s) Performed: Procedure(s) (LRB): LEFT TOTAL KNEE ARTHROPLASTY WITH COMPUTER NAVIGATION (Left)  Patient location during evaluation: PACU Anesthesia Type: Spinal Level of consciousness: oriented and awake and alert Pain management: pain level controlled Vital Signs Assessment: post-procedure vital signs reviewed and stable Respiratory status: spontaneous breathing, respiratory function stable and patient connected to nasal cannula oxygen Cardiovascular status: blood pressure returned to baseline and stable Postop Assessment: no headache, no backache and spinal receding Anesthetic complications: no    Last Vitals:  Vitals:   10/09/16 0655 10/09/16 0859  BP: 118/64 135/69  Pulse: 72 96  Resp: 16   Temp: 36.7 C     Last Pain:  Vitals:   10/09/16 1400  TempSrc:   PainSc: 2                  Zaevion Parke,JAMES TERRILL

## 2016-11-13 ENCOUNTER — Emergency Department (HOSPITAL_COMMUNITY)
Admission: EM | Admit: 2016-11-13 | Discharge: 2016-11-13 | Disposition: A | Discharge status: Home / Self Care | Payer: Medicare Other | Attending: Emergency Medicine | Admitting: Emergency Medicine

## 2016-11-13 ENCOUNTER — Emergency Department (HOSPITAL_COMMUNITY): Payer: Medicare Other

## 2016-11-13 ENCOUNTER — Ambulatory Visit (INDEPENDENT_AMBULATORY_CARE_PROVIDER_SITE_OTHER): Payer: Medicare Other | Admitting: Physician Assistant

## 2016-11-13 ENCOUNTER — Encounter (HOSPITAL_COMMUNITY): Payer: Self-pay | Admitting: Emergency Medicine

## 2016-11-13 VITALS — BP 108/70 | HR 89 | Temp 97.9°F | Resp 18 | Ht 61.5 in | Wt 201.0 lb

## 2016-11-13 DIAGNOSIS — R300 Dysuria: Secondary | ICD-10-CM

## 2016-11-13 DIAGNOSIS — R634 Abnormal weight loss: Secondary | ICD-10-CM

## 2016-11-13 DIAGNOSIS — Z96652 Presence of left artificial knee joint: Secondary | ICD-10-CM | POA: Diagnosis not present

## 2016-11-13 DIAGNOSIS — Z853 Personal history of malignant neoplasm of breast: Secondary | ICD-10-CM | POA: Insufficient documentation

## 2016-11-13 DIAGNOSIS — R350 Frequency of micturition: Secondary | ICD-10-CM | POA: Diagnosis present

## 2016-11-13 DIAGNOSIS — Z79899 Other long term (current) drug therapy: Secondary | ICD-10-CM | POA: Diagnosis not present

## 2016-11-13 DIAGNOSIS — N390 Urinary tract infection, site not specified: Secondary | ICD-10-CM

## 2016-11-13 DIAGNOSIS — I10 Essential (primary) hypertension: Secondary | ICD-10-CM | POA: Diagnosis not present

## 2016-11-13 DIAGNOSIS — R42 Dizziness and giddiness: Secondary | ICD-10-CM | POA: Diagnosis not present

## 2016-11-13 LAB — BASIC METABOLIC PANEL
Anion gap: 9 (ref 5–15)
BUN: 33 mg/dL — AB (ref 6–20)
CALCIUM: 9.8 mg/dL (ref 8.9–10.3)
CO2: 22 mmol/L (ref 22–32)
CREATININE: 1.82 mg/dL — AB (ref 0.44–1.00)
Chloride: 107 mmol/L (ref 101–111)
GFR calc Af Amer: 29 mL/min — ABNORMAL LOW (ref >60)
GFR, EST NON AFRICAN AMERICAN: 25 mL/min — AB (ref >60)
GLUCOSE: 101 mg/dL — AB (ref 65–99)
Potassium: 4 mmol/L (ref 3.5–5.1)
Sodium: 138 mmol/L (ref 135–145)

## 2016-11-13 LAB — POCT CBC
GRANULOCYTE PERCENT: 65.4 % (ref 37–80)
HCT, POC: 26.9 % — AB (ref 37.7–47.9)
HEMOGLOBIN: 9.2 g/dL — AB (ref 12.2–16.2)
Lymph, poc: 2.4 (ref 0.6–3.4)
MCH: 26.3 pg — AB (ref 27–31.2)
MCHC: 34.1 g/dL (ref 31.8–35.4)
MCV: 76.9 fL — AB (ref 80–97)
MID (CBC): 0.2 (ref 0–0.9)
MPV: 7.3 fL (ref 0–99.8)
POC Granulocyte: 4.9 (ref 2–6.9)
POC LYMPH PERCENT: 31.9 %L (ref 10–50)
POC MID %: 2.7 % (ref 0–12)
Platelet Count, POC: 223 10*3/uL (ref 142–424)
RBC: 3.5 M/uL — AB (ref 4.04–5.48)
RDW, POC: 15.3 %
WBC: 7.5 10*3/uL (ref 4.6–10.2)

## 2016-11-13 LAB — POCT URINALYSIS DIP (MANUAL ENTRY)
BILIRUBIN UA: NEGATIVE
GLUCOSE UA: NEGATIVE
NITRITE UA: NEGATIVE
PH UA: 5
Protein Ur, POC: NEGATIVE
RBC UA: NEGATIVE
UROBILINOGEN UA: 0.2

## 2016-11-13 LAB — POC MICROSCOPIC URINALYSIS (UMFC): Mucus: ABSENT

## 2016-11-13 MED ORDER — SULFAMETHOXAZOLE-TRIMETHOPRIM 800-160 MG PO TABS: 1.0000 | ORAL_TABLET | Freq: Two times a day (BID) | ORAL | 0 refills | Status: AC

## 2016-11-13 MED ORDER — SODIUM CHLORIDE 0.9 % IV BOLUS (SEPSIS)
500.0000 mL | Freq: Once | INTRAVENOUS | Status: AC
  Administered 2016-11-13: 500 mL via INTRAVENOUS

## 2016-11-13 MED ORDER — SULFAMETHOXAZOLE-TRIMETHOPRIM 800-160 MG PO TABS
1.0000 | ORAL_TABLET | Freq: Once | ORAL | Status: AC
  Administered 2016-11-13: 1 via ORAL
  Filled 2016-11-13: qty 1

## 2016-11-13 NOTE — Progress Notes (Signed)
[facility] [address], [place] [address] [phone]  Date:  11/13/2016   Name:  Tracy Faulkner   DOB:  [birth year]   MRN:  [id number]  PCP:  Pcp Not In System    History of Present Illness:  Tracy Faulkner is a 82 y.o. female patient who presents to [facility] for cc of Cc complaint of weakness and dysuria.   Patient has noticed 2 weeks of weakened stream.  No abdominal pain, fever, nausea.  She does have back pain bilaterally.  Feels weak and dizzy.  Dizzy with rising.  More prominent with rising.  She does have sob when she is "rushing".  Daughter states that she is no more disoriented than she usually is.  Some chest pain in the center with aching and sharp.  Not associated with exertion.  No numbness or tingling. Total knee replacement procedure more than 1 month ago.  Blood transfusion given prior to procedure.  She is currently not on any iron supplement.  She is constipated with 2-3 bm per week.   She is eating very little for the last month.  18lb weight loss in a 37 day span.  She states that she just does not have an appetite.      Patient Active Problem List   Diagnosis Date Noted  . Degenerative arthritis of left knee 10/07/2016  . Near syncope 12/31/2015  . Chest discomfort 12/31/2015  . Premature ventricular contractions 12/31/2015  . Breast cancer ([place])   . Disturbance, sleep   . Arthritis   . Anemia   . Frequent headaches   . Back problem   . Bowel trouble   . Obesity   . Carpal tunnel syndrome   . Ataxia   . Hypertension   . Pain in both knees     Past Medical History:  Diagnosis Date  . Anemia   . Arthritis   . Ataxia   . Back problem   . Bowel trouble   . Breast cancer ([place])   . Carpal tunnel syndrome   . Constipation   . Disturbance, sleep   . Dyspnea    increased exertion   . Frequent headaches   . History of urinary tract infection   . Hypertension   . Obesity   . Pain in both knees   . Urinary incontinence      Past Surgical History:  Procedure Laterality Date  . BREAST SURGERY     for cancer-right breast  . COLON SURGERY     secondary to bowel blockages  . EYE SURGERY     cataract surgery bilateral   . gastric stapling    . KNEE ARTHROPLASTY Left 10/07/2016   Procedure: LEFT TOTAL KNEE ARTHROPLASTY WITH COMPUTER NAVIGATION;  Surgeon: Rod Can, MD;  Location: [facility] ORS;  Service: Orthopedics;  Laterality: Left;    Social History  Substance Use Topics  . Smoking status: Never Smoker  . Smokeless tobacco: Never Used  . Alcohol use No    Family History  Problem Relation Age of Onset  . Arthritis Mother   . Cancer Mother   . Diabetes Mellitus II Mother   . Hypertension Mother   . Kidney disease Mother     Allergies  Allergen Reactions  . Ace Inhibitors Cough  . Lortab [Hydrocodone-Acetaminophen]     Bad dreams  . Remeron [Mirtazapine] Other (See Comments)    Hallucinations (can take 1/2 tablet--7.5 mg), not 15 mg  . Penicillins  Rash and Other (See Comments)    Throat swelling, Has patient had a PCN reaction causing immediate rash, facial/tongue/throat swelling, SOB or lightheadedness with hypotension: Yes Has patient had a PCN reaction causing severe rash involving mucus membranes or skin necrosis: No Has patient had a PCN reaction that required hospitalization No Has patient had a PCN reaction occurring within the last 10 years: No If all of the above answers are "NO", then may proceed with Cephalosporin use.     Medication list has been reviewed and updated.  Current Outpatient Prescriptions on File Prior to Visit  Medication Sig Dispense Refill  . CycloSPORINE (RESTASIS OP) Place 1 drop into both eyes 2 (two) times daily.    Marland Kitchen HYDROcodone-acetaminophen (NORCO/VICODIN) 5-325 MG tablet Take 1-2 tablets by mouth every 4 (four) hours as needed (breakthrough pain). 30 tablet 0  . senna (SENOKOT) 8.6 MG TABS tablet Take 2 tablets (17.2 mg total) by mouth at bedtime. 120  each 0  . valsartan-hydrochlorothiazide (DIOVAN-HCT) 160-12.5 MG tablet Take 1 tablet by mouth daily.    Marland Kitchen amLODipine (NORVASC) 10 MG tablet Take 10 mg by mouth every evening.     Marland Kitchen apixaban (ELIQUIS) 2.5 MG TABS tablet Take 1 tablet (2.5 mg total) by mouth 2 (two) times daily. (Patient not taking: Reported on 11/13/2016) 60 tablet 0  . BIOTIN PO Take 1 tablet by mouth daily.    . butalbital-acetaminophen-caffeine (FIORICET, ESGIC) 50-325-40 MG tablet Take 1 tablet by mouth every 6 (six) hours as needed for headache.    . Cholecalciferol (VITAMIN D3) 10000 units TABS Take 1,000 Units by mouth daily.    Marland Kitchen docusate sodium (COLACE) 100 MG capsule Take 1 capsule (100 mg total) by mouth 2 (two) times daily. (Patient not taking: Reported on 11/13/2016) 10 capsule 0  . methocarbamol (ROBAXIN) 500 MG tablet Take 1 tablet (500 mg total) by mouth every 6 (six) hours as needed for muscle spasms. (Patient not taking: Reported on 11/13/2016) 20 tablet 0  . mirtazapine (REMERON) 15 MG tablet Take 7.5 mg by mouth at bedtime.     . Multiple Vitamin (MULTIVITAMIN) tablet Take 1 tablet by mouth daily.    . Omega-3 Fatty Acids (FISH OIL PO) Take 2 capsules by mouth daily.    . ondansetron (ZOFRAN) 4 MG tablet Take 1 tablet (4 mg total) by mouth every 6 (six) hours as needed for nausea. (Patient not taking: Reported on 11/13/2016) 20 tablet 0  . OVER THE COUNTER MEDICATION Take 1 tablet by mouth daily. Med Name: HAIR VITAMINS     No current facility-administered medications on file prior to visit.     ROS ROS otherwise unremarkable unless listed above.  Physical Examination: BP 108/70 (BP Location: Left Arm, Patient Position: Sitting, Cuff Size: Large)   Pulse 89   Temp 97.9 F (36.6 C) (Oral)   Resp 18   Ht 5' 1.5" (1.562 m)   Wt 201 lb (91.2 kg)   SpO2 100%   BMI 37.36 kg/m  Ideal Body Weight: Weight in (lb) to have BMI = 25: 134.2  Physical Exam  Constitutional: She is oriented to person, place,  and time. She appears well-developed and well-nourished. No distress.  HENT:  Head: Normocephalic and atraumatic.  Right Ear: External ear normal.  Left Ear: External ear normal.  Eyes: Conjunctivae and EOM are normal. Pupils are equal, round, and reactive to light.  Cardiovascular: Normal rate and regular rhythm.  Exam reveals no gallop and no friction rub.  No murmur heard. Pulses:      Carotid pulses are 2+ on the right side, and 2+ on the left side.      Dorsalis pedis pulses are 2+ on the right side, and 2+ on the left side.  Pulmonary/Chest: Effort normal. No respiratory distress.  Abdominal: Normal appearance and bowel sounds are normal. There is generalized tenderness. There is no CVA tenderness.  Neurological: She is alert and oriented to person, place, and time.  Skin: She is not diaphoretic.  Psychiatric: She has a normal mood and affect. Her behavior is normal.    Results for orders placed or performed in visit on 11/13/16  POCT urinalysis dipstick  Result Value Ref Range   Color, UA yellow yellow   Clarity, UA clear clear   Glucose, UA negative negative   Bilirubin, UA negative negative   Ketones, POC UA trace (5) (A) negative   Spec Grav, UA >=1.030    Blood, UA negative negative   pH, UA 5.0    Protein Ur, POC negative negative   Urobilinogen, UA 0.2    Nitrite, UA Negative Negative   Leukocytes, UA Trace (A) Negative  POCT Microscopic Urinalysis ([facility])  Result Value Ref Range   WBC,UR,HPF,POC Few (A) None WBC/hpf   RBC,UR,HPF,POC None None RBC/hpf   Bacteria None None, Too numerous to count   Mucus Absent Absent   Epithelial Cells, UR Per Microscopy Moderate (A) None, Too numerous to count cells/hpf  POCT CBC  Result Value Ref Range   WBC 7.5 4.6 - 10.2 K/uL   Lymph, poc 2.4 0.6 - 3.4   POC LYMPH PERCENT 31.9 10 - 50 %L   MID (cbc) 0.2 0 - 0.9   POC MID % 2.7 0 - 12 %M   POC Granulocyte 4.9 2 - 6.9   Granulocyte percent 65.4 37 - 80 %G   RBC 3.50 (A)  4.04 - 5.48 M/uL   Hemoglobin 9.2 (A) 12.2 - 16.2 g/dL   HCT, POC 26.9 (A) 37.7 - 47.9 %   MCV 76.9 (A) 80 - 97 fL   MCH, POC 26.3 (A) 27 - 31.2 pg   MCHC 34.1 31.8 - 35.4 g/dL   RDW, POC 15.3 %   Platelet Count, POC 223 142 - 424 K/uL   MPV 7.3 0 - 99.8 fL   Orthostatic VS for the past 24 hrs (Last 3 readings):  BP- Lying Pulse- Lying BP- Sitting Pulse- Sitting BP- Standing at 0 minutes Pulse- Standing at 0 minutes  11/13/16 1212 97/68 83 102/68 86 (!) 83/59 99   Wt Readings from Last 3 Encounters:  11/13/16 201 lb (91.2 kg)  10/07/16 219 lb (99.3 kg)  10/01/16 219 lb 6 oz (99.5 kg)      Assessment and Plan: Tracy Faulkner is a 82 y.o. female who is here today for urinary complaint, weakness, with an 18lb weight loss. Orthostatic hypotension, anemic, and present symptoms--we must rule out SIRS.  She will need rehydration IV with monitoring, and possible IV antibiotics.  Loss of appetite may be secondary to opiate use.  Possible stroke/TIA, DVT, SIRS pyelonephritis, opiate-induced appetite change and weight loss.   I have advised t    Dysuria - Plan: POCT urinalysis dipstick, POCT Microscopic Urinalysis ([facility]), Urine culture  Dizziness - Plan: Urine culture  Lightheaded - Plan: POCT CBC, EKG 12-Lead, Urine culture  Ivar Drape, PA-C [facility] 11/13/2016 12:12 PM

## 2016-11-13 NOTE — ED Triage Notes (Signed)
Per daughter pt sent for urgent medical and family care to be evaluated for possible infection of the bladder/kidney.

## 2016-11-13 NOTE — ED Provider Notes (Signed)
[facility] DEPT Provider Note   CSN: [id number] Arrival date & time: 11/13/16  1344     History   Chief Complaint Chief Complaint  Patient presents with  . Abnormal Lab    HPI Tracy Faulkner is a 82 y.o. female.  Patient is a 82 year old female with a history of chronic anemia, hypertension and arthritis. She presents with a possible UTI. She states over the last week she's had generalized weakness and decreased appetite with some lightheadedness on standing. She denies any speech deficits. No vision changes. No nausea or vomiting. No known fevers. She does have urinary frequency and has had urinary tract infections in the past. She went to [facility] urgent care for evaluation and they did a urinalysis which they felt was suspicious for UTI. They felt that she might need IV antibiotics and fluids and sent her here for further evaluation. She was also noted to be hypotensive only on standing at [facility] urgent care. She did take her blood pressure medicine this morning. She had recent total knee replacement about a month ago and is staying with her daughter here in town. She doesn't have a PCP in [place]. She states her knee is doing much better and she is ambulating without too much difficulty.      Past Medical History:  Diagnosis Date  . Anemia   . Arthritis   . Ataxia   . Back problem   . Bowel trouble   . Breast cancer ([place])   . Carpal tunnel syndrome   . Constipation   . Disturbance, sleep   . Dyspnea    increased exertion   . Frequent headaches   . History of urinary tract infection   . Hypertension   . Obesity   . Pain in both knees   . Urinary incontinence     Patient Active Problem List   Diagnosis Date Noted  . Degenerative arthritis of left knee 10/07/2016  . Near syncope 12/31/2015  . Chest discomfort 12/31/2015  . Premature ventricular contractions 12/31/2015  . Breast cancer ([place])   . Disturbance, sleep   . Arthritis   . Anemia   . Frequent  headaches   . Back problem   . Bowel trouble   . Obesity   . Carpal tunnel syndrome   . Ataxia   . Hypertension   . Pain in both knees     Past Surgical History:  Procedure Laterality Date  . BREAST SURGERY     for cancer-right breast  . COLON SURGERY     secondary to bowel blockages  . EYE SURGERY     cataract surgery bilateral   . gastric stapling    . KNEE ARTHROPLASTY Left 10/07/2016   Procedure: LEFT TOTAL KNEE ARTHROPLASTY WITH COMPUTER NAVIGATION;  Surgeon: Rod Can, MD;  Location: [facility] ORS;  Service: Orthopedics;  Laterality: Left;    OB History    No data available       Home Medications    Prior to Admission medications   Medication Sig Start Date End Date Taking? Authorizing Provider  butalbital-acetaminophen-caffeine (FIORICET, ESGIC) 50-325-40 MG tablet Take 1 tablet by mouth every 6 (six) hours as needed for headache.   Yes Historical Provider, MD  CycloSPORINE (RESTASIS OP) Place 1 drop into both eyes 2 (two) times daily.   Yes Historical Provider, MD  HYDROcodone-acetaminophen (NORCO/VICODIN) 5-325 MG tablet Take 1-2 tablets by mouth every 4 (four) hours as needed (breakthrough pain). 10/08/16  Yes Rod Can, MD  mirtazapine (  REMERON) 15 MG tablet Take 7.5 mg by mouth at bedtime.    Yes Historical Provider, MD  senna (SENOKOT) 8.6 MG TABS tablet Take 2 tablets (17.2 mg total) by mouth at bedtime. 10/08/16  Yes Rod Can, MD  triamcinolone cream (KENALOG) 0.1 % Apply 1 application topically daily as needed for rash. 09/15/16  Yes Historical Provider, MD  valsartan-hydrochlorothiazide (DIOVAN-HCT) 160-12.5 MG tablet Take 1 tablet by mouth daily.   Yes Historical Provider, MD  apixaban (ELIQUIS) 2.5 MG TABS tablet Take 1 tablet (2.5 mg total) by mouth 2 (two) times daily. Patient not taking: Reported on 11/13/2016 10/08/16   Rod Can, MD  BIOTIN PO Take 1 tablet by mouth daily.    Historical Provider, MD  Cholecalciferol (VITAMIN D3) 10000  units TABS Take 1,000 Units by mouth daily.    Historical Provider, MD  docusate sodium (COLACE) 100 MG capsule Take 1 capsule (100 mg total) by mouth 2 (two) times daily. Patient not taking: Reported on 11/13/2016 10/08/16   Rod Can, MD  methocarbamol (ROBAXIN) 500 MG tablet Take 1 tablet (500 mg total) by mouth every 6 (six) hours as needed for muscle spasms. Patient not taking: Reported on 11/13/2016 10/08/16   Rod Can, MD  ondansetron (ZOFRAN) 4 MG tablet Take 1 tablet (4 mg total) by mouth every 6 (six) hours as needed for nausea. Patient not taking: Reported on 11/13/2016 10/08/16   Rod Can, MD  sulfamethoxazole-trimethoprim (BACTRIM DS,SEPTRA DS) 800-160 MG tablet Take 1 tablet by mouth 2 (two) times daily. 11/13/16 11/20/16  Malvin Johns, MD    Family History Family History  Problem Relation Age of Onset  . Arthritis Mother   . Cancer Mother   . Diabetes Mellitus II Mother   . Hypertension Mother   . Kidney disease Mother     Social History Social History  Substance Use Topics  . Smoking status: Never Smoker  . Smokeless tobacco: Never Used  . Alcohol use No     Allergies   Ace inhibitors; Lortab [hydrocodone-acetaminophen]; Remeron [mirtazapine]; and Penicillins   Review of Systems Review of Systems  Constitutional: Positive for fatigue. Negative for chills, diaphoresis and fever.  HENT: Negative for congestion, rhinorrhea and sneezing.   Eyes: Negative.   Respiratory: Negative for cough, chest tightness and shortness of breath.   Cardiovascular: Negative for chest pain and leg swelling.  Gastrointestinal: Negative for abdominal pain, blood in stool, diarrhea, nausea and vomiting.  Genitourinary: Positive for frequency. Negative for difficulty urinating, flank pain and hematuria.  Musculoskeletal: Negative for arthralgias and back pain.  Skin: Negative for rash.  Neurological: Positive for light-headedness. Negative for dizziness, speech  difficulty, weakness, numbness and headaches.     Physical Exam Updated Vital Signs BP 118/66 (BP Location: Right Arm)   Pulse 82   Temp 97.8 F (36.6 C) (Oral)   Resp 16   Ht 5\' 1"  (1.549 m)   Wt 195 lb (88.5 kg)   SpO2 100%   BMI 36.84 kg/m   Physical Exam  Constitutional: She is oriented to person, place, and time. She appears well-developed and well-nourished.  HENT:  Head: Normocephalic and atraumatic.  Eyes: Pupils are equal, round, and reactive to light.  Neck: Normal range of motion. Neck supple.  Cardiovascular: Normal rate, regular rhythm and normal heart sounds.   Pulmonary/Chest: Effort normal and breath sounds normal. No respiratory distress. She has no wheezes. She has no rales. She exhibits no tenderness.  Abdominal: Soft. Bowel sounds are normal. There  is no tenderness. There is no rebound and no guarding.  Musculoskeletal: Normal range of motion. She exhibits no edema.  Lymphadenopathy:    She has no cervical adenopathy.  Neurological: She is alert and oriented to person, place, and time.  Motor 5/5 all extremities Sensation grossly intact to LT all extremities Finger to Nose intact, no pronator drift CN II-XII grossly intact Gait normal   Skin: Skin is warm and dry. No rash noted.  Psychiatric: She has a normal mood and affect.     ED Treatments / Results  Labs (all labs ordered are listed, but only abnormal results are displayed) Labs Reviewed  BASIC METABOLIC PANEL - Abnormal; Notable for the following:       Result Value   Glucose, Bld 101 (*)    BUN 33 (*)    Creatinine, Ser 1.82 (*)    GFR calc non Af Amer 25 (*)    GFR calc Af Amer 29 (*)    All other components within normal limits    EKG  EKG Interpretation None       Radiology Dg Chest 2 View  Result Date: 11/13/2016 CLINICAL DATA:  Cough and weakness for 2 weeks.  Breast carcinoma. EXAM: CHEST  2 VIEW COMPARISON:  None. FINDINGS: The heart size and mediastinal contours are  within normal limits. Ectasia and atherosclerotic calcification of thoracic aorta noted. Both lungs are clear. No evidence of pneumothorax or pleural effusion. Surgical clips and staples noted in the left upper quadrant. IMPRESSION: No active cardiopulmonary disease. Aortic atherosclerosis. Electronically Signed   By: Earle Gell M.D.   On: 11/13/2016 15:04    Procedures Procedures (including critical care time)  Medications Ordered in ED Medications  sodium chloride 0.9 % bolus 500 mL (0 mLs Intravenous Stopped 11/13/16 1559)  sulfamethoxazole-trimethoprim (BACTRIM DS,SEPTRA DS) 800-160 MG per tablet 1 tablet (1 tablet Oral Given 11/13/16 1607)     Initial Impression / Assessment and Plan / ED Course  I have reviewed the triage vital signs and the nursing notes.  Pertinent labs & imaging results that were available during my care of the patient were reviewed by me and considered in my medical decision making (see chart for details).  Clinical Course     Reviewed the results from [facility] urgent care. Her urine has subtle signs of infection. She has anemia but her hemoglobin is similar to her prior recent values. I added a BMP here.  Her creatinine is just slightly elevated as compared to her prior values. Chest x-ray is clear without evidence of pneumonia. Patient is feeling better. She's able to ambulate without any symptoms. She has no neurologic deficits. She has no significant orthostatic hypotension. She was discharged home in good condition. She was started on Bactrim. Return precautions were given. She was advised to hold her blood pressure medicine tomorrow and follow-up with [facility] urgent care on Monday for recheck.  Final Clinical Impressions(s) / ED Diagnoses   Final diagnoses:  Lower urinary tract infectious disease    New Prescriptions New Prescriptions   SULFAMETHOXAZOLE-TRIMETHOPRIM (BACTRIM DS,SEPTRA DS) 800-160 MG TABLET    Take 1 tablet by mouth 2 (two) times daily.      Malvin Johns, MD 11/13/16 [phone]

## 2016-11-13 NOTE — Discharge Instructions (Signed)
Hold your blood pressure medicine tomorrow. Follow-up with [facility] urgent care in 2 days for recheck. Return here for any worsening symptoms.

## 2016-11-13 NOTE — Patient Instructions (Addendum)
  Please go to [facility].  I have contacted the charge nurse. This may just be the need to treat an infection of the bladder/kidney but we must be sure.  Best to be monitored.   IF you received an x-ray today, you will receive an invoice from [facility]. Please contact [facility] at [phone] with questions or concerns regarding your invoice.   IF you received labwork today, you will receive an invoice from [facility]. Please contact [facility] at [phone] with questions or concerns regarding your invoice.   Our billing staff will not be able to assist you with questions regarding bills from these companies.  You will be contacted with the lab results as soon as they are available. The fastest way to get your results is to activate your My Chart account. Instructions are located on the last page of this paperwork. If you have not heard from [place] regarding the results in 2 weeks, please contact this office.

## 2016-11-15 ENCOUNTER — Ambulatory Visit (INDEPENDENT_AMBULATORY_CARE_PROVIDER_SITE_OTHER): Payer: Medicare Other | Admitting: Family Medicine

## 2016-11-15 VITALS — BP 126/88 | HR 86 | Temp 98.1°F | Wt 203.0 lb

## 2016-11-15 DIAGNOSIS — R1311 Dysphagia, oral phase: Secondary | ICD-10-CM | POA: Diagnosis not present

## 2016-11-15 DIAGNOSIS — I952 Hypotension due to drugs: Secondary | ICD-10-CM

## 2016-11-15 LAB — URINE CULTURE

## 2016-11-15 NOTE — Progress Notes (Signed)
Chief Complaint  Patient presents with  . Follow-up    hosp f/u    HPI   Patient is a 82 year old female with a history of chronic anemia, hypertension and arthritis. She was diagnosed with UTI and was started on Bactrim with fluid hydration in the Emergency Department. She states that she has not taken the Diovan HCT since ER discharge.   She recently had L TKA and reports that her left knee pain is improved.   She reports that she mostly eat fruits. She reports that no matter how much she chews she cannot get the food down even if she chews it to a pulp she cannot get it down.  This has been for a few months.  She also stutters which has been for a few years. She reports that she can eat pureed foods such as apple sauce, creamed potatoes. BP Readings from Last 3 Encounters:  11/15/16 126/88  11/13/16 118/66  11/13/16 108/70   Wt Readings from Last 3 Encounters:  11/15/16 203 lb (92.1 kg)  11/13/16 195 lb (88.5 kg)  11/13/16 201 lb (91.2 kg)      Past Medical History:  Diagnosis Date  . Anemia   . Arthritis   . Ataxia   . Back problem   . Bowel trouble   . Breast cancer ([place])   . Carpal tunnel syndrome   . Constipation   . Disturbance, sleep   . Dyspnea    increased exertion   . Frequent headaches   . History of urinary tract infection   . Hypertension   . Obesity   . Pain in both knees   . Urinary incontinence     Current Outpatient Prescriptions  Medication Sig Dispense Refill  . apixaban (ELIQUIS) 2.5 MG TABS tablet Take 1 tablet (2.5 mg total) by mouth 2 (two) times daily. 60 tablet 0  . BIOTIN PO Take 1 tablet by mouth daily.    . butalbital-acetaminophen-caffeine (FIORICET, ESGIC) 50-325-40 MG tablet Take 1 tablet by mouth every 6 (six) hours as needed for headache.    . Cholecalciferol (VITAMIN D3) 10000 units TABS Take 1,000 Units by mouth daily.    . CycloSPORINE (RESTASIS OP) Place 1 drop into both eyes 2 (two) times daily.    Mar[place] Kitchen docusate sodium  (COLACE) 100 MG capsule Take 1 capsule (100 mg total) by mouth 2 (two) times daily. 10 capsule 0  . HYDROcodone-acetaminophen (NORCO/VICODIN) 5-325 MG tablet Take 1-2 tablets by mouth every 4 (four) hours as needed (breakthrough pain). 30 tablet 0  . methocarbamol (ROBAXIN) 500 MG tablet Take 1 tablet (500 mg total) by mouth every 6 (six) hours as needed for muscle spasms. 20 tablet 0  . mirtazapine (REMERON) 15 MG tablet Take 7.5 mg by mouth at bedtime.     . ondansetron (ZOFRAN) 4 MG tablet Take 1 tablet (4 mg total) by mouth every 6 (six) hours as needed for nausea. 20 tablet 0  . senna (SENOKOT) 8.6 MG TABS tablet Take 2 tablets (17.2 mg total) by mouth at bedtime. 120 each 0  . sulfamethoxazole-trimethoprim (BACTRIM DS,SEPTRA DS) 800-160 MG tablet Take 1 tablet by mouth 2 (two) times daily. 14 tablet 0  . triamcinolone cream (KENALOG) 0.1 % Apply 1 application topically daily as needed for rash.     No current facility-administered medications for this visit.     Allergies:  Allergies  Allergen Reactions  . Ace Inhibitors Cough  . Lortab [Hydrocodone-Acetaminophen]     Bad dreams  .  Remeron [Mirtazapine] Other (See Comments)    Hallucinations (can take 1/2 tablet--7.5 mg), not 15 mg  . Penicillins Rash and Other (See Comments)    Throat swelling, Has patient had a PCN reaction causing immediate rash, facial/tongue/throat swelling, SOB or lightheadedness with hypotension: Yes Has patient had a PCN reaction causing severe rash involving mucus membranes or skin necrosis: No Has patient had a PCN reaction that required hospitalization No Has patient had a PCN reaction occurring within the last 10 years: No If all of the above answers are "NO", then may proceed with Cephalosporin use.     Past Surgical History:  Procedure Laterality Date  . BREAST SURGERY     for cancer-right breast  . COLON SURGERY     secondary to bowel blockages  . EYE SURGERY     cataract surgery bilateral     . gastric stapling    . KNEE ARTHROPLASTY Left 10/07/2016   Procedure: LEFT TOTAL KNEE ARTHROPLASTY WITH COMPUTER NAVIGATION;  Surgeon: Rod Can, MD;  Location: [facility] ORS;  Service: Orthopedics;  Laterality: Left;    Social History   Social History  . Marital status: Widowed    Spouse name: N/A  . Number of children: N/A  . Years of education: 8   Occupational History  . unemployed    Social History Main Topics  . Smoking status: Never Smoker  . Smokeless tobacco: Never Used  . Alcohol use No  . Drug use: No  . Sexual activity: No   Other Topics Concern  . None   Social History Narrative  . None    Review of Systems  Constitutional: Negative for chills and fever.  Cardiovascular: Negative for chest pain and palpitations.  Gastrointestinal: Negative for nausea and vomiting.  Genitourinary: Negative for dysuria and urgency.  Musculoskeletal: Positive for joint pain.  Neurological: Negative for dizziness and headaches.  Psychiatric/Behavioral: Negative for depression. The patient is not nervous/anxious.     Objective: Vitals:   11/15/16 1438  BP: 126/88  Pulse: 86  Temp: 98.1 F (36.7 C)  TempSrc: Oral  SpO2: 99%  Weight: 203 lb (92.1 kg)    Physical Exam  Constitutional: She is oriented to person, place, and time. She appears well-developed and well-nourished.  HENT:  Head: Normocephalic and atraumatic.  Eyes: Conjunctivae and EOM are normal.  Cardiovascular: Normal rate, regular rhythm and normal heart sounds.   Pulmonary/Chest: Effort normal and breath sounds normal. No respiratory distress. She has no wheezes.  Musculoskeletal:  Bilateral OA changes of both knees  Neurological: She is alert and oriented to person, place, and time.  Skin: Skin is warm. Capillary refill takes less than 2 seconds.     Assessment and Plan [place] was seen today for follow-up.  Diagnoses and all orders for this visit:  Hypotension due to drugs- discussed that she  should stop her bp medications but restart if b.p. increases above [id number] systolic or 90 diastolic  Oral phase dysphagia- discussed that she may have difficulty with swallowing solid foods which affects her intake of food and quality of life as well as increasing choking risk -     Ambulatory referral to [place]

## 2016-11-15 NOTE — Patient Instructions (Addendum)
Restart Diovan-HCTZ at a 1/2 tablet if the blood pressure is greater than A999333 systolic (top number) or great than 90 for the diastolic (bottom number)   IF you received an x-ray today, you will receive an invoice from [facility]. Please contact [facility] at [phone] with questions or concerns regarding your invoice.   IF you received labwork today, you will receive an invoice from [place]. Please contact [facility] at [phone] with questions or concerns regarding your invoice.   Our billing staff will not be able to assist you with questions regarding bills from these companies.  You will be contacted with the lab results as soon as they are available. The fastest way to get your results is to activate your My Chart account. Instructions are located on the last page of this paperwork. If you have not heard from [place] regarding the results in 2 weeks, please contact this office.     How to Take Your Blood Pressure Blood pressure is a measurement of how strongly your blood is pressing against the walls of your arteries. Arteries are blood vessels that carry blood from your heart throughout your body. Your health care provider takes your blood pressure at each office visit. You can also take your own blood pressure at home with a blood pressure machine. You may need to take your own blood pressure:  To confirm a diagnosis of high blood pressure (hypertension).  To monitor your blood pressure over time.  To make sure your blood pressure medicine is working. Supplies needed: To take your blood pressure, you will need a blood pressure machine. You can buy a blood pressure machine, or blood pressure monitor, at most drugstores or online. There are several types of home blood pressure monitors. When choosing one, consider the following:  Choose a monitor that has an arm cuff.  Choose a monitor that wraps snugly around your upper arm. You should be able to fit only one  finger between your arm and the cuff.  Do not choose a monitor that measures your blood pressure from your wrist or finger. Your health care provider can suggest a reliable monitor that will meet your needs. How to prepare To get the most accurate reading, avoid the following for 30 minutes before you check your blood pressure:  Drinking caffeine.  Drinking alcohol.  Eating.  Smoking.  Exercising. Five minutes before you check your blood pressure:  Empty your bladder.  Sit quietly without talking in a dining chair, rather than in a soft couch or armchair. How to take your blood pressure To check your blood pressure, follow the instructions in the manual that came with your blood pressure monitor. If you have a digital blood pressure monitor, the instructions may be as follows: 1. Sit up straight. 2. Place your feet on the floor. Do not cross your ankles or legs. 3. Rest your left arm at the level of your heart on a table or desk or on the arm of a chair. 4. Pull up your shirt sleeve. 5. Wrap the blood pressure cuff around the upper part of your left arm, 1 inch (2.5 cm) above your elbow. It is best to wrap the cuff around bare skin. 6. Fit the cuff snugly around your arm. You should be able to place only one finger between the cuff and your arm. 7. Position the cord inside the groove of your elbow. 8. Press the power button. 9. Sit quietly while the cuff inflates and deflates. 10. Read the  digital reading on the monitor screen and write it down (record it). 11. Wait 2-3 minutes, then repeat the steps starting at step 1. What does my blood pressure reading mean? A blood pressure reading is recorded as two numbers, such as "120 over 80" (or 120/80). The first ("top") number is called the systolic pressure. It is a measure of the pressure in your arteries as the heart beats. The second ("bottom") number is called the diastolic pressure. It is a measure of the pressure in your arteries  as the heart relaxes between beats. Blood pressure is classified into four stages. The following are the stages for adults who do not have a short-term serious illness or a chronic condition. Systolic pressure and diastolic pressure are measured in a unit called mm Hg. Normal  Systolic pressure: below 123456.  Diastolic pressure: below 80. Prehypertension  Systolic pressure: 123456.  Diastolic pressure: XX123456. Hypertension stage 1  Systolic pressure: A999333.  Diastolic pressure: A999333. Hypertension stage 2  Systolic pressure: 0000000 or above.  Diastolic pressure: 123XX123 or above. You can have prehypertension or hypertension even if only the systolic or only the diastolic number in your reading is higher than normal. Follow these instructions at home:  Check your blood pressure as often as recommended by your health care provider.  Take your monitor to the next appointment with your health care provider to make sure:  That you are using it correctly.  That it provides accurate readings.  Be sure you understand what your goal blood pressure numbers are.  Tell your health care provider if you are having any side effects from blood pressure medicine. Contact a health care provider if:  Your blood pressure is consistently high. Get help right away if:  Your systolic blood pressure is higher than 180.  Your diastolic blood pressure is higher than 110. This information is not intended to replace advice given to you by your health care provider. Make sure you discuss any questions you have with your health care provider. Document Released: 04/23/2016 Document Revised: 07/06/2016 Document Reviewed: 04/23/2016 Elsevier Interactive Patient Education  2017 [employer].

## 2017-03-10 ENCOUNTER — Telehealth: Payer: Self-pay | Admitting: Physician Assistant

## 2017-03-10 NOTE — Telephone Encounter (Signed)
I saw her daughter today.  She states that the speech pathology was never performed.  Can we see about having her seen at neurology consult.

## 2017-03-14 NOTE — Telephone Encounter (Signed)
For a neuro consult we will just need a neuro referral placed. For speech pathology she will likely need a swallow test done which I can set up since they did not already do this for her. Please let me know which avenue you'd like to pursue. Thanks!

## 2017-10-05 DIAGNOSIS — D509 Iron deficiency anemia, unspecified: Secondary | ICD-10-CM | POA: Insufficient documentation

## 2017-10-05 DIAGNOSIS — E559 Vitamin D deficiency, unspecified: Secondary | ICD-10-CM | POA: Insufficient documentation

## 2017-10-13 IMAGING — DX DG KNEE 1-2V PORT*L*
2 series · 2 of 2 positions shown · non-contrast
Comparison: None available.

CLINICAL DATA: Postoperative evaluation for left total knee
arthroplasty.

EXAM:
PORTABLE LEFT KNEE - 1-2 VIEW

[knee ap]
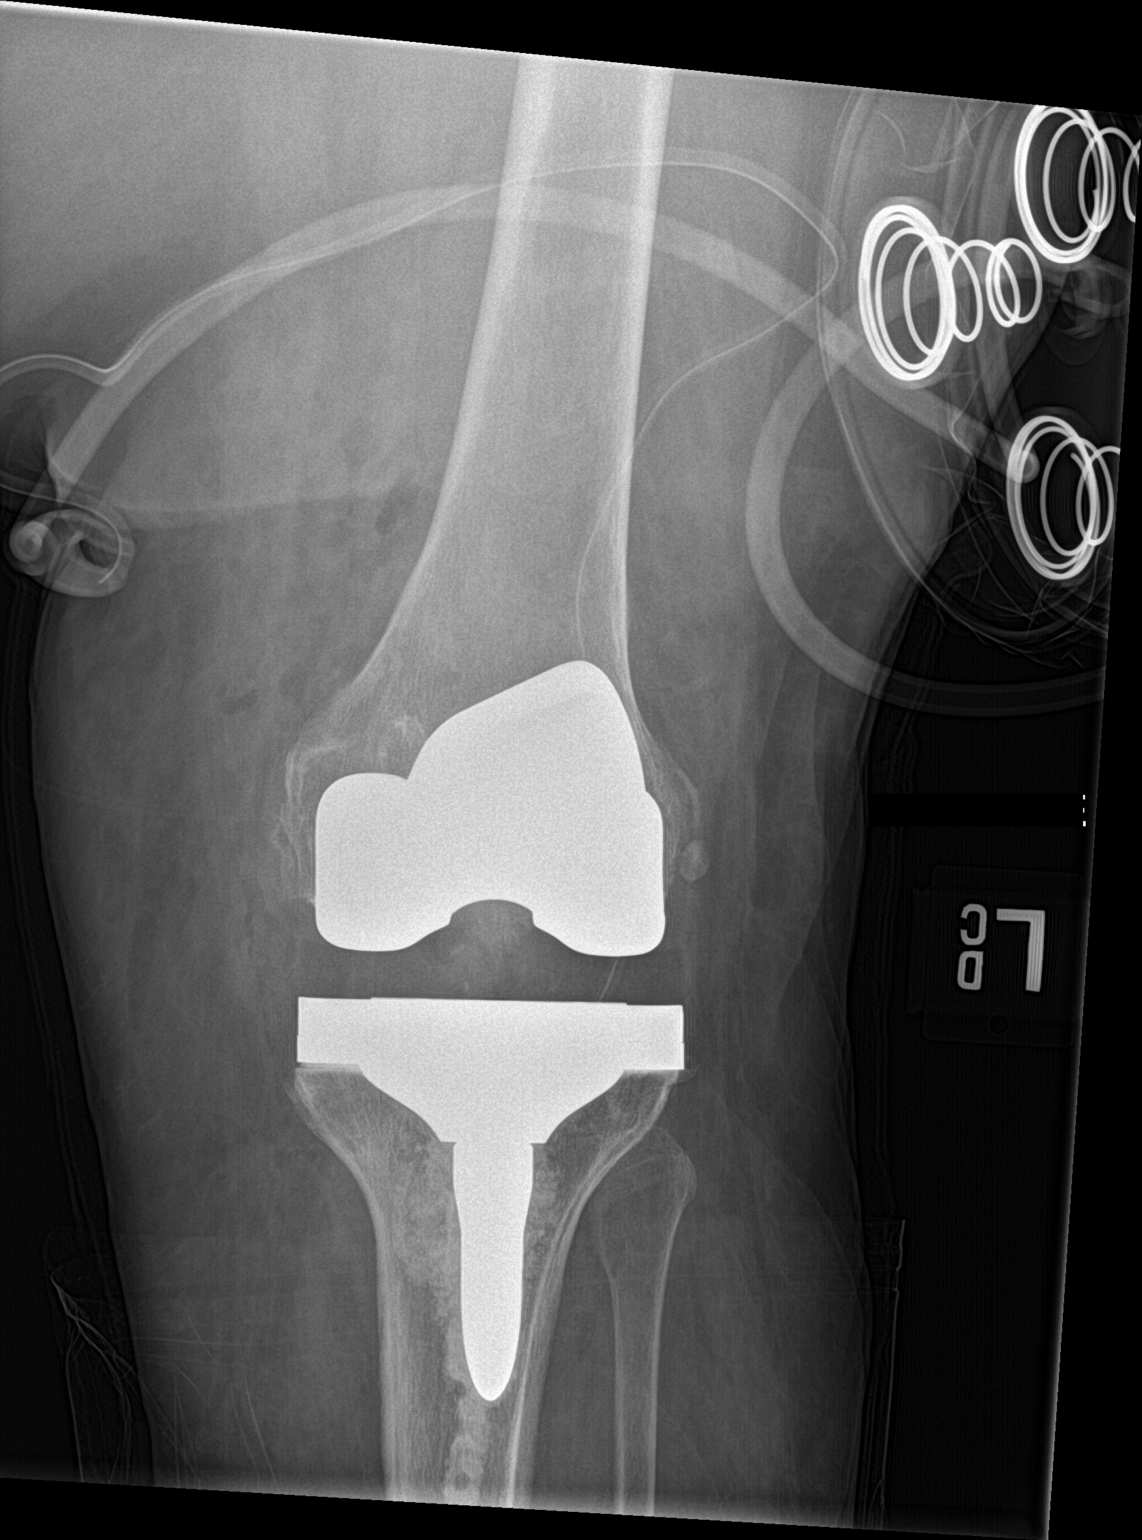

[knee lat]
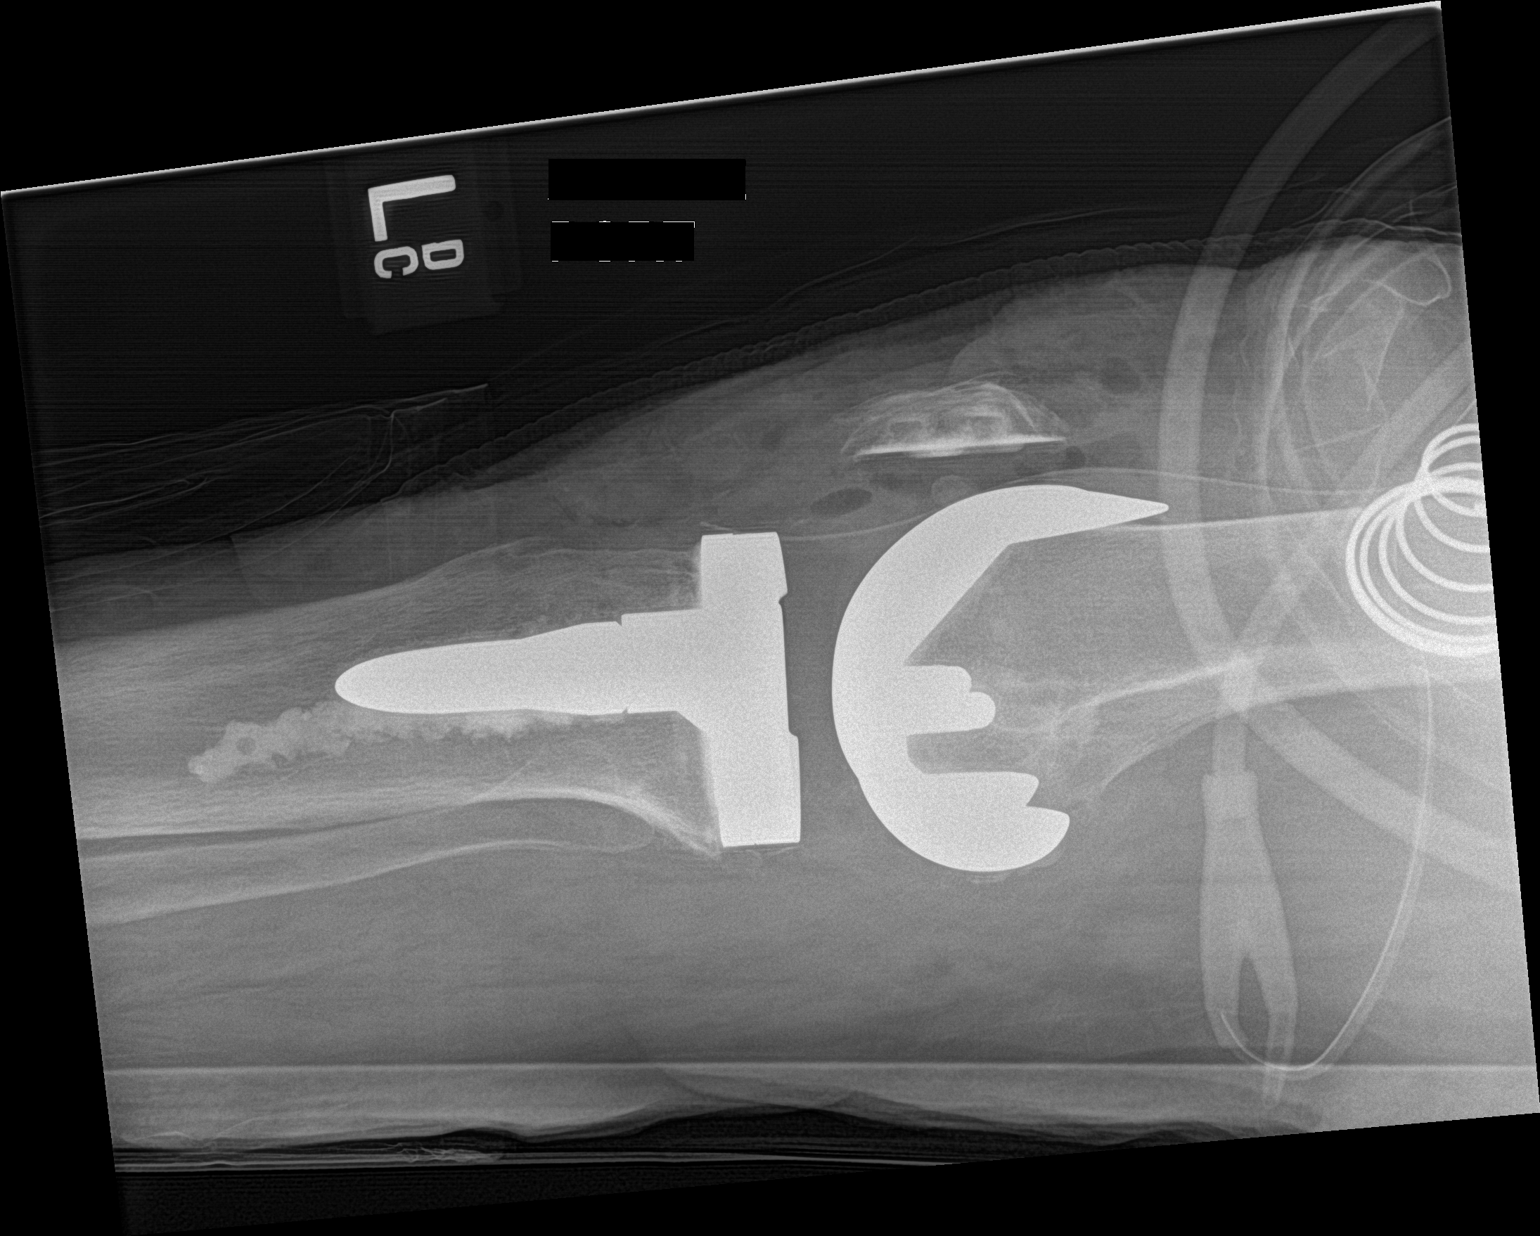

[2 of 2 positions shown; findings below may reference images not displayed]

FINDINGS: A left total knee arthroplasties in place. The femoral and tibial
components appear well seated in normal alignment. No periprosthetic
fracture or other complication. Postoperative soft tissue swelling
and emphysema present about the knee. Surgical drain in place.
IMPRESSION: Postoperative changes from recent left total knee arthroplasty
without complication.

## 2017-11-19 IMAGING — CR DG CHEST 2V
2 series · 2 of 2 positions shown · non-contrast
Comparison: None.

CLINICAL DATA: Cough and weakness for 2 weeks.  Breast carcinoma.

EXAM:
CHEST  2 VIEW

[w chest lat]
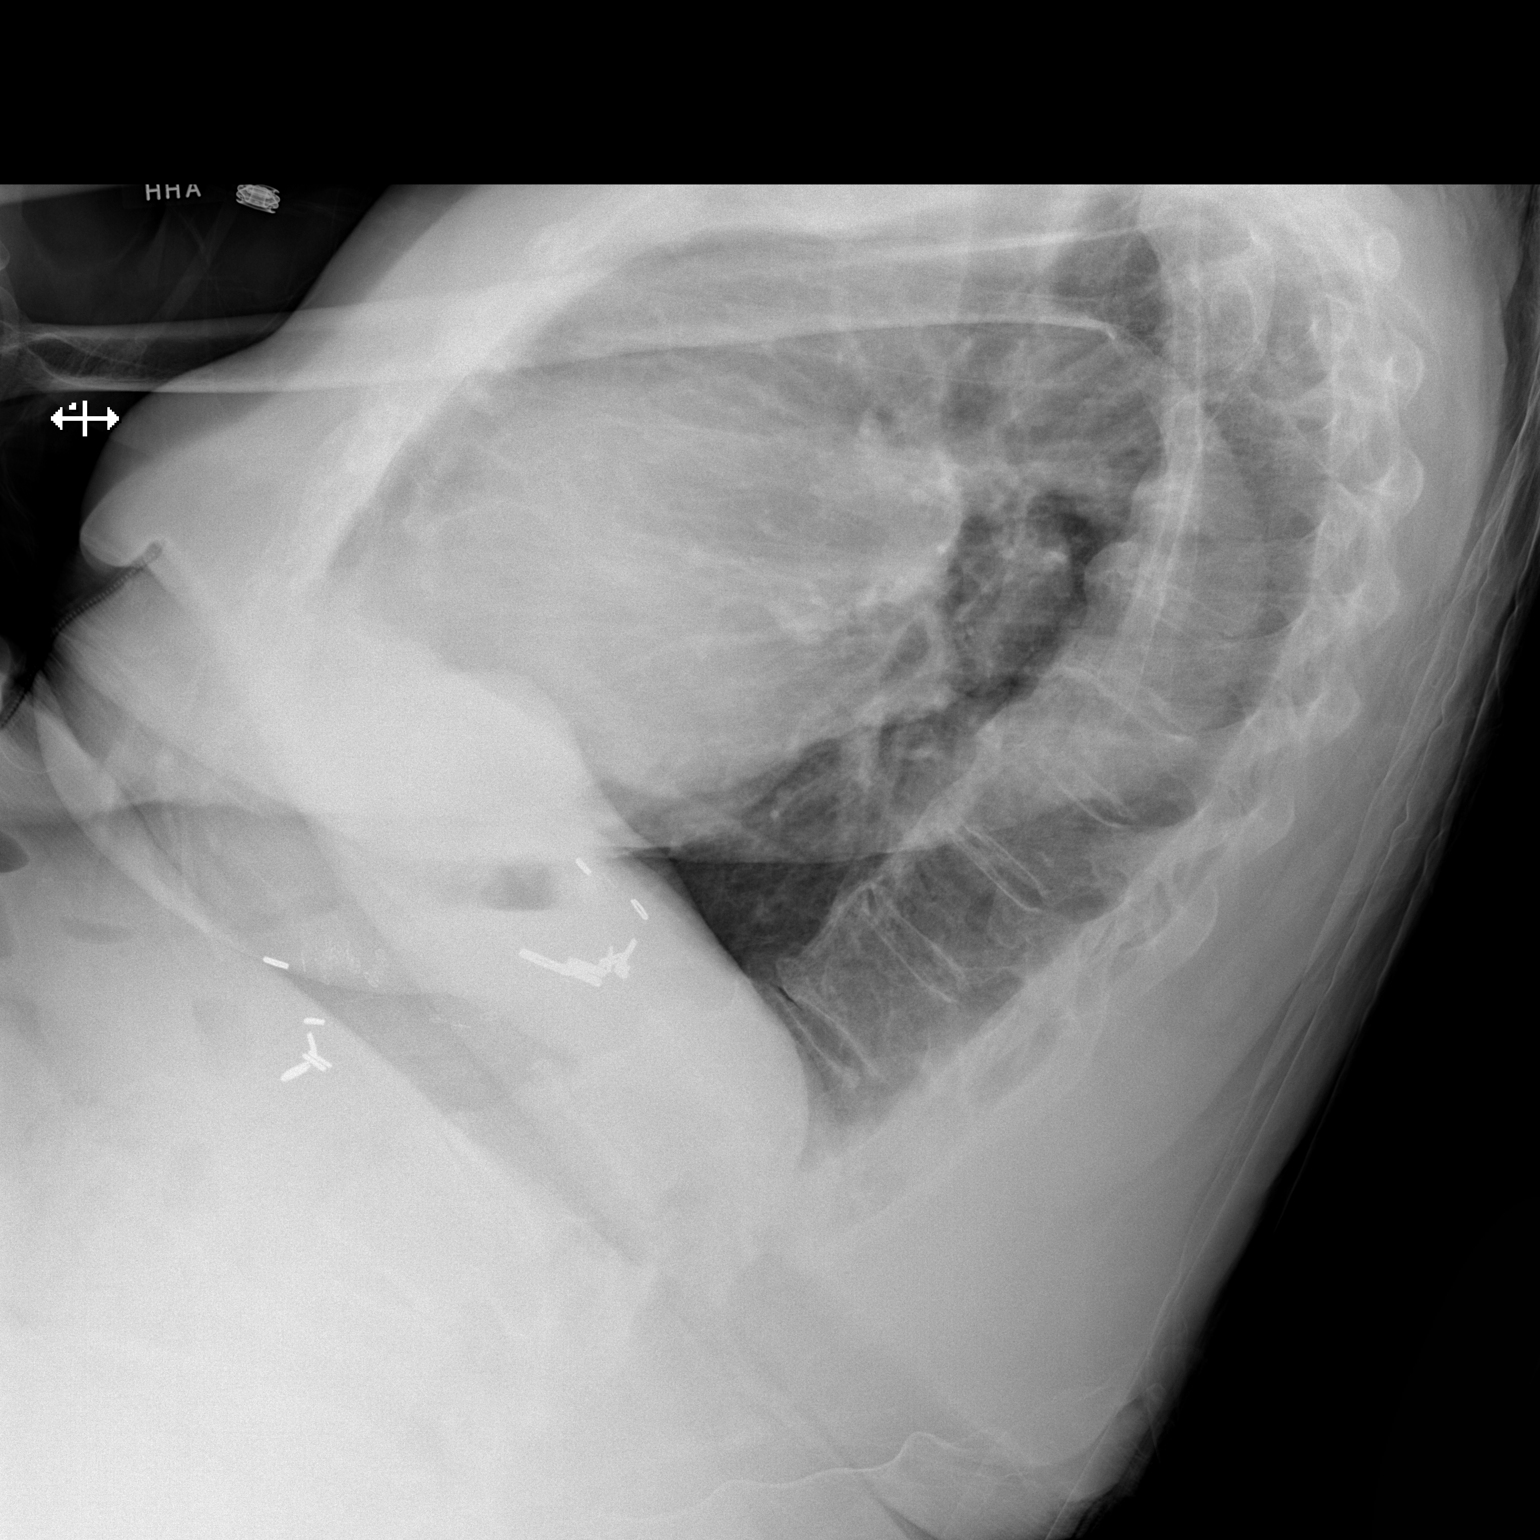

[x chest ap]
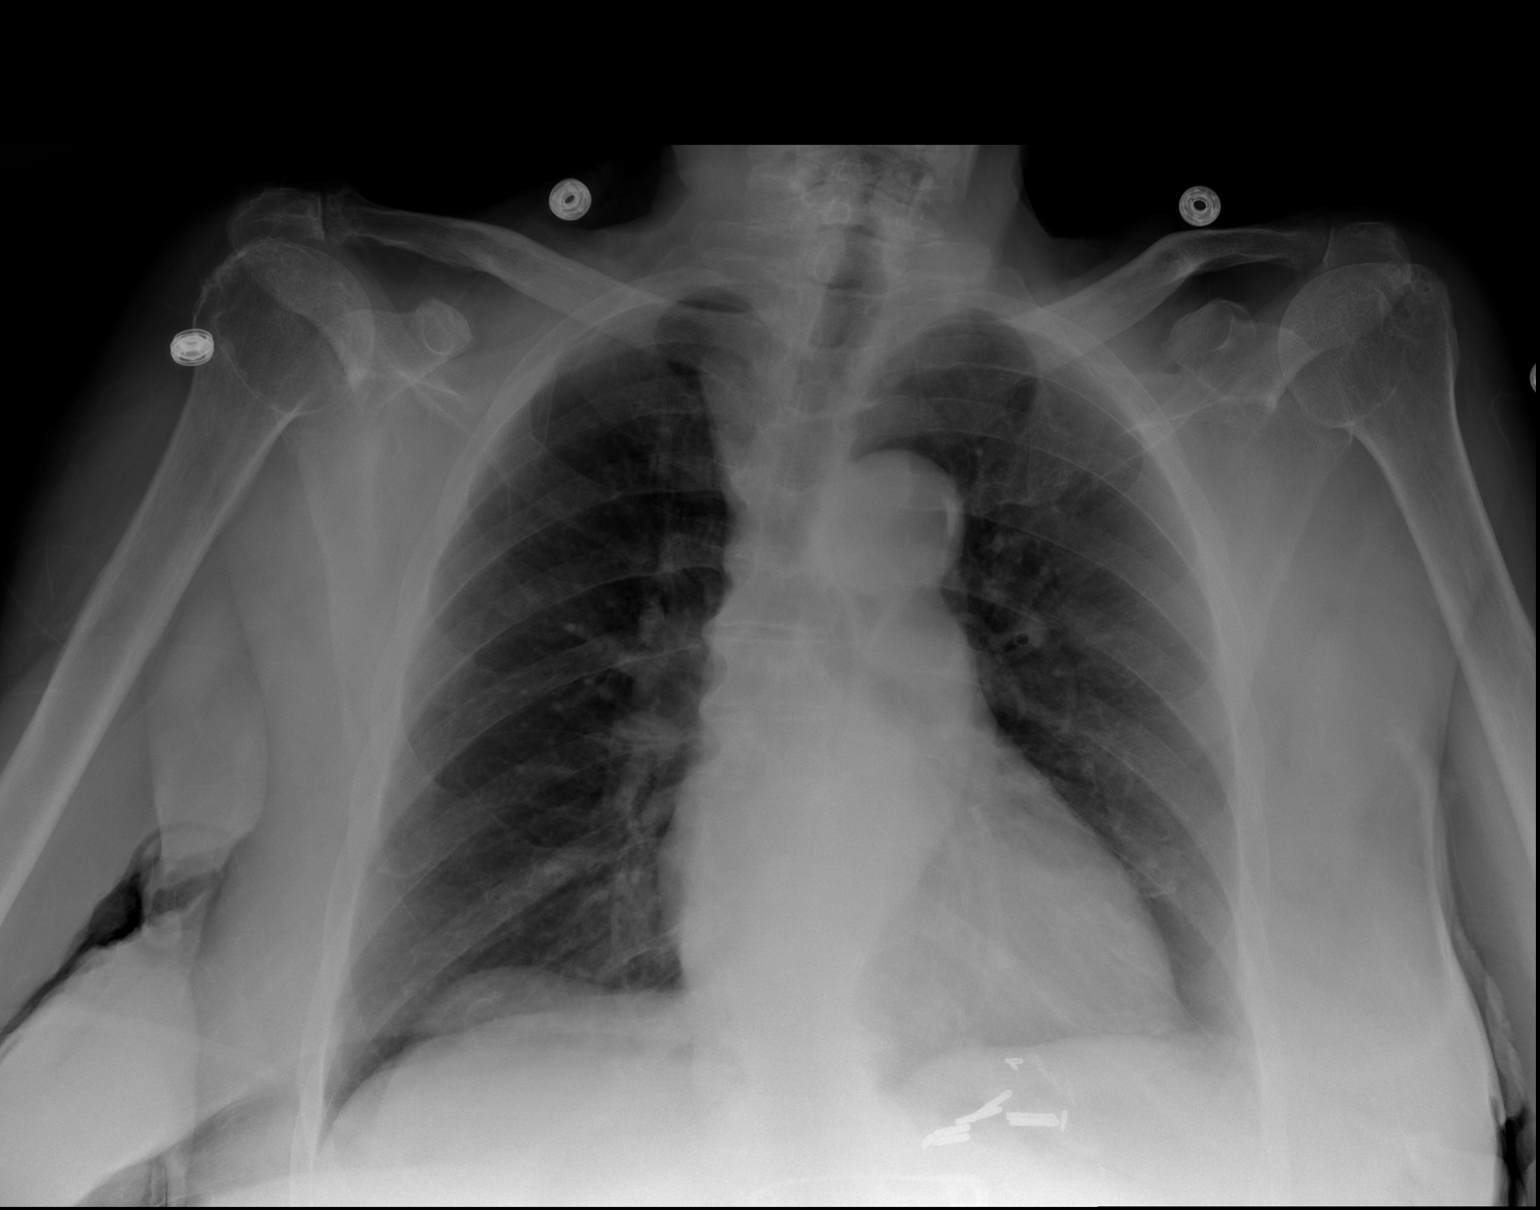

[2 of 2 positions shown; findings below may reference images not displayed]

FINDINGS: The heart size and mediastinal contours are within normal limits.
Ectasia and atherosclerotic calcification of thoracic aorta noted.

Both lungs are clear. No evidence of pneumothorax or pleural
effusion. Surgical clips and staples noted in the left upper
quadrant.
IMPRESSION: No active cardiopulmonary disease.

Aortic atherosclerosis.

## 2018-02-28 ENCOUNTER — Encounter: Payer: Self-pay | Admitting: Physician Assistant

## 2019-03-22 ENCOUNTER — Encounter: Payer: Self-pay | Admitting: Interventional Cardiology

## 2019-07-19 ENCOUNTER — Telehealth: Payer: Self-pay

## 2019-07-19 NOTE — Telephone Encounter (Signed)
ERROR ON FIRST NOTE.  NOTES ON FILE FROM DR Mikeal Hawthorne [phone]  SENT REFERRAL TO [facility]

## 2019-07-19 NOTE — Telephone Encounter (Signed)
NOTES ON FILE FROM DR Janeth Rase LATHAM [place]

## 2019-08-01 ENCOUNTER — Telehealth: Payer: Self-pay

## 2019-08-01 NOTE — Progress Notes (Signed)
Virtual Visit via Video Note   This visit type was conducted due to national recommendations for restrictions regarding the COVID-19 Pandemic (e.g. social distancing) in an effort to limit this patient's exposure and mitigate transmission in our community.  Due to her co-morbid illnesses, this patient is at least at moderate risk for complications without adequate follow up.  This format is felt to be most appropriate for this patient at this time.  All issues noted in this document were discussed and addressed.  A limited physical exam was performed with this format.  Please refer to the patient's chart for her consent to telehealth for [facility].   Date:  08/01/2019   ID:  Tracy Faulkner, DOB 03-23-1932, MRN [id number]  Patient Location: Home Provider Location: Home  PCP:  System, Pcp Not In  Cardiologist:  No primary care provider on file.  Electrophysiologist:  None   Evaluation Performed:  Follow-Up Visit  Chief Complaint:  PVC  History of Present Illness:    Tracy Faulkner is a 85 y.o. female with with a hx of palpitations, hypertension, obesity, and chest pain. Recent benign Holter monitor.  This is a very difficult situation.  The patient is 51.  She has had history of palpitations that date back to a prior evaluation done in 2016 and 17.  She has back again with complaints of her heart skips and it makes her have difficulty sleeping.  When this occurs she also feels nervous and weak.  She is having difficulty with her balance, headaches, and has a staggering feeling.  According to the daughter she had 2 hospitalizations 1 year ago.  One was associated with accidentally accelerating and running into a [employer] store.  They do not know if she passed out or just mistake the break for the accelerated.  No significant abnormalities were found.  A second hospitalization was associated with GI bleeding and sepsis.  She had 4 units of blood and received platelets.  She is gradually  gotten better since that time.  It is in this scenario that she is referred by her primary physician who did a 1 day monitor which was essentially normal.  He refers her now for consideration of further evaluation for "atrial fibrillation".  His office is then [place].  He is Dr. Leisa Lenz.  The patient is still mobile.  She works in her garden.  This being a phone conversation, unable to determine how she looks.  She sounds for radial.  The patient does not have symptoms concerning for COVID-19 infection (fever, chills, cough, or new shortness of breath).    Past Medical History:  Diagnosis Date  . Anemia   . Arthritis   . Ataxia   . Back problem   . Bowel trouble   . Breast cancer ([place])   . Carpal tunnel syndrome   . Constipation   . Disturbance, sleep   . Dyspnea    increased exertion   . Frequent headaches   . History of urinary tract infection   . Hypertension   . Obesity   . Pain in both knees   . Urinary incontinence    Past Surgical History:  Procedure Laterality Date  . BREAST SURGERY     for cancer-right breast  . COLON SURGERY     secondary to bowel blockages  . EYE SURGERY     cataract surgery bilateral   . gastric stapling    . KNEE ARTHROPLASTY Left 10/07/2016   Procedure: LEFT  TOTAL KNEE ARTHROPLASTY WITH COMPUTER NAVIGATION;  Surgeon: Rod Can, MD;  Location: [facility];  Service: Orthopedics;  Laterality: Left;     No outpatient medications have been marked as taking for the 08/02/19 encounter (Appointment) with Belva Crome, MD.     Allergies:   Ace inhibitors, Lortab [hydrocodone-acetaminophen], Remeron [mirtazapine], and Penicillins   Social History   Tobacco Use  . Smoking status: Never Smoker  . Smokeless tobacco: Never Used  Substance Use Topics  . Alcohol use: No    Alcohol/week: 0.0 standard drinks  . Drug use: No     Family Hx: The patient's family history includes Arthritis in her mother; Cancer in her mother; Diabetes  Mellitus II in her mother; Hypertension in her mother; Kidney disease in her mother.  ROS:   Please see the history of present illness.    Multiple complaints as noted above including staggery feeling, headaches, weakness, nervousness, and difficulty sleeping. All other systems reviewed and are negative.   Prior CV studies:   The following studies were reviewed today:  Recent brief 24-hour monitor did not demonstrate any significant arrhythmia.  Only 4100 beats were recorded.  The patient states that she took the monitor off multiple times to be able to walk around and do things in her yard.  Labs/Other Tests and Data Reviewed:    EKG:  No ECG reviewed.  Recent Labs: No results found for requested labs within last 8760 hours.   Recent Lipid Panel No results found for: CHOL, TRIG, HDL, CHOLHDL, LDLCALC, LDLDIRECT  Wt Readings from Last 3 Encounters:  11/15/16 203 lb (92.1 kg)  11/13/16 195 lb (88.5 kg)  11/13/16 201 lb (91.2 kg)     Objective:    Vital Signs:  There were no vitals taken for this visit.   VITAL SIGNS:  reviewed  ASSESSMENT & PLAN:    1. Premature ventricular contractions   2. Near syncope   3. Essential hypertension   4. Class 2 obesity due to excess calories without serious comorbidity in adult, unspecified BMI   5. Educated About Covid-19 Virus Infection    PLAN:  1. The patient has been previously proven to have premature ventricular contractions.  She is having more severe episodes of irregular heartbeat.  I have recommended a 14-day continuous monitor to help exclude atrial fibrillation or other potential explanations for dizziness/near syncope and syncope. 2. See dialogue above. 3. Excellent blood pressure control. 4. No new data. 5. Social distancing, handwashing, and mask wearing.  Follow-up will be dependent upon findings.  COVID-19 Education: The signs and symptoms of COVID-19 were discussed with the patient and how to seek care for  testing (follow up with PCP or arrange E-visit).  The importance of social distancing was discussed today.  Time:   Today, I have spent 15 minutes with the patient with telehealth technology discussing the above problems.     Medication Adjustments/Labs and Tests Ordered: Current medicines are reviewed at length with the patient today.  Concerns regarding medicines are outlined above.   Tests Ordered: No orders of the defined types were placed in this encounter.   Medication Changes: No orders of the defined types were placed in this encounter.   Follow Up:  In Person in 4 month(s)  Signed, Sinclair Grooms, MD  08/01/2019 7:44 PM    [place]

## 2019-08-01 NOTE — Telephone Encounter (Signed)
**Note Tracy-Identified via Obfuscation** Virtual Visit Pre-Appointment Phone Call  "(Name), I am calling you today to discuss your upcoming appointment. We are currently trying to limit exposure to the virus that causes COVID-19 by seeing patients at home rather than in the office."  1. "What is the BEST phone number to call the day of the visit?" - include this in appointment notes  2. "Do you have or have access to (through a family member/friend) a smartphone with video capability that we can use for your visit?" a. If yes - list this number in appt notes as "cell" (if different from BEST phone #) and list the appointment type as a VIDEO visit in appointment notes b. If no - list the appointment type as a PHONE visit in appointment notes  3. Confirm consent - "In the setting of the current Covid19 crisis, you are scheduled for a (phone or video) visit with your provider on (date) at (time).  Just as we do with many in-office visits, in order for you to participate in this visit, we must obtain consent.  If you'd like, I can send this to your mychart (if signed up) or email for you to review.  Otherwise, I can obtain your verbal consent now.  All virtual visits are billed to your insurance company just like a normal visit would be.  By agreeing to a virtual visit, we'd like you to understand that the technology does not allow for your provider to perform an examination, and thus may limit your provider's ability to fully assess your condition. If your provider identifies any concerns that need to be evaluated in person, we will make arrangements to do so.  Finally, though the technology is pretty good, we cannot assure that it will always work on either your or our end, and in the setting of a video visit, we may have to convert it to a phone-only visit.  In either situation, we cannot ensure that we have a secure connection.  Are you willing to proceed?" STAFF: Did the patient verbally acknowledge consent to telehealth visit? Document  YES/NO here: yes   4. Advise patient to be prepared - "Two hours prior to your appointment, go ahead and check your blood pressure, pulse, oxygen saturation, and your weight (if you have the equipment to check those) and write them all down. When your visit starts, your provider will ask you for this information. If you have an Apple Watch or Kardia device, please plan to have heart rate information ready on the day of your appointment. Please have a pen and paper handy nearby the day of the visit as well."  5. Give patient instructions for MyChart download to smartphone OR Doximity/Doxy.me as below if video visit (depending on what platform provider is using)  6. Inform patient they will receive a phone call 15 minutes prior to their appointment time (may be from unknown caller ID) so they should be prepared to answer    TELEPHONE CALL NOTE  Tracy Faulkner has been deemed a candidate for a follow-up tele-health visit to limit community exposure during the Covid-19 pandemic. I spoke with the patient via phone to ensure availability of phone/video source, confirm preferred email & phone number, and discuss instructions and expectations.  I reminded Tracy Faulkner to be prepared with any vital sign and/or heart rhythm information that could potentially be obtained via home monitoring, at the time of her visit. I reminded Tracy Faulkner to expect a phone call prior  to her visit.  Tracy Faulkner, [facility] 08/01/2019 1:47 PM   INSTRUCTIONS FOR DOWNLOADING THE MYCHART APP TO SMARTPHONE  - The patient must first make sure to have activated MyChart and know their login information - If Apple, go to [employer] and type in MyChart in the search bar and download the app. If Android, ask patient to go to [employer] and type in [place] in the search bar and download the app. The app is free but as with any other app downloads, their phone may require them to verify saved payment information or  Apple/Android password.  - The patient will need to then log into the app with their MyChart username and password, and select [facility] as their healthcare provider to link the account. When it is time for your visit, go to the MyChart app, find appointments, and click Begin Video Visit. Be sure to Select Allow for your device to access the Microphone and Camera for your visit. You will then be connected, and your provider will be with you shortly.  **If they have any issues connecting, or need assistance please contact MyChart service desk [phone])**  **If using a computer, in order to ensure the best quality for their visit they will need to use either of the following Internet Browsers: [employer], or Google Chrome**  IF USING DOXIMITY or DOXY.ME - The patient will receive a link just prior to their visit by text.     FULL LENGTH CONSENT FOR TELE-HEALTH VISIT   I hereby voluntarily request, consent and authorize [facility] and its employed or contracted physicians, physician assistants, nurse practitioners or other licensed health care professionals (the Practitioner), to provide me with telemedicine health care services (the "Services") as deemed necessary by the treating Practitioner. I acknowledge and consent to receive the Services by the Practitioner via telemedicine. I understand that the telemedicine visit will involve communicating with the Practitioner through live audiovisual communication technology and the disclosure of certain medical information by electronic transmission. I acknowledge that I have been given the opportunity to request an in-person assessment or other available alternative prior to the telemedicine visit and am voluntarily participating in the telemedicine visit.  I understand that I have the right to withhold or withdraw my consent to the use of telemedicine in the course of my care at any time, without affecting my right to future care  or treatment, and that the Practitioner or I may terminate the telemedicine visit at any time. I understand that I have the right to inspect all information obtained and/or recorded in the course of the telemedicine visit and may receive copies of available information for a reasonable fee.  I understand that some of the potential risks of receiving the Services via telemedicine include:  Marland Kitchen Delay or interruption in medical evaluation due to technological equipment failure or disruption; . Information transmitted may not be sufficient (e.g. poor resolution of images) to allow for appropriate medical decision making by the Practitioner; and/or  . In rare instances, security protocols could fail, causing a breach of personal health information.  Furthermore, I acknowledge that it is my responsibility to provide information about my medical history, conditions and care that is complete and accurate to the best of my ability. I acknowledge that Practitioner's advice, recommendations, and/or decision may be based on factors not within their control, such as incomplete or inaccurate data provided by me or distortions of diagnostic images or specimens that may result from electronic  transmissions. I understand that the practice of medicine is not an exact science and that Practitioner makes no warranties or guarantees regarding treatment outcomes. I acknowledge that I will receive a copy of this consent concurrently upon execution via email to the email address I last provided but may also request a printed copy by calling the office of [facility].    I understand that my insurance will be billed for this visit.   I have read or had this consent read to me. . I understand the contents of this consent, which adequately explains the benefits and risks of the Services being provided via telemedicine.  . I have been provided ample opportunity to ask questions regarding this consent and the Services and have had  my questions answered to my satisfaction. . I give my informed consent for the services to be provided through the use of telemedicine in my medical care  By participating in this telemedicine visit I agree to the above.

## 2019-08-01 NOTE — Telephone Encounter (Signed)
Called pt and left message about the change office visit to virtual

## 2019-08-02 ENCOUNTER — Telehealth: Payer: Self-pay | Admitting: Radiology

## 2019-08-02 ENCOUNTER — Telehealth (INDEPENDENT_AMBULATORY_CARE_PROVIDER_SITE_OTHER): Payer: Medicare PPO | Admitting: Interventional Cardiology

## 2019-08-02 ENCOUNTER — Other Ambulatory Visit: Payer: Self-pay

## 2019-08-02 ENCOUNTER — Encounter: Payer: Self-pay | Admitting: Interventional Cardiology

## 2019-08-02 VITALS — BP 124/86 | HR 80 | Ht 66.0 in | Wt 185.0 lb

## 2019-08-02 DIAGNOSIS — E6609 Other obesity due to excess calories: Secondary | ICD-10-CM

## 2019-08-02 DIAGNOSIS — I493 Ventricular premature depolarization: Secondary | ICD-10-CM

## 2019-08-02 DIAGNOSIS — I10 Essential (primary) hypertension: Secondary | ICD-10-CM

## 2019-08-02 DIAGNOSIS — Z7189 Other specified counseling: Secondary | ICD-10-CM

## 2019-08-02 DIAGNOSIS — R55 Syncope and collapse: Secondary | ICD-10-CM

## 2019-08-02 NOTE — Telephone Encounter (Signed)
Enrolled patient for a 14 day Zio monitor to be mailed. Patient and daughter knows to expect the monitor to arrive in 3-4 days.  Monitor was sent to daughters house at [address] [place] [id number]

## 2019-08-02 NOTE — Patient Instructions (Signed)
Medication Instructions:  Your physician recommends that you continue on your current medications as directed. Please refer to the Current Medication list given to you today.  If you need a refill on your cardiac medications before your next appointment, please call your pharmacy.   Lab work: None If you have labs (blood work) drawn today and your tests are completely normal, you will receive your results only by: Marland Kitchen MyChart Message (if you have MyChart) OR . A paper copy in the mail If you have any lab test that is abnormal or we need to change your treatment, we will call you to review the results.  Testing/Procedures: Your physician recommends that you wear a monitor for 2 weeks  Follow-Up: Your physician recommends that you schedule a follow-up appointment as needed with Dr. Tamala Julian.    Any Other Special Instructions Will Be Listed Below (If Applicable).

## 2019-08-12 ENCOUNTER — Ambulatory Visit (INDEPENDENT_AMBULATORY_CARE_PROVIDER_SITE_OTHER): Payer: Medicare PPO

## 2019-08-12 DIAGNOSIS — I493 Ventricular premature depolarization: Secondary | ICD-10-CM | POA: Diagnosis not present

## 2019-10-01 ENCOUNTER — Telehealth: Payer: Self-pay | Admitting: Interventional Cardiology

## 2019-10-01 NOTE — Telephone Encounter (Signed)
Spoke with daughter and made her aware I am waiting on Dr. Tamala Julian to review.  Also made her aware we do not have a DPR on file.  Spoke with pt and she gave permission to give results to daughter once they are back.

## 2019-10-01 NOTE — Telephone Encounter (Signed)
New message  Patient's daughter is calling in to go over the monitor results. Please give patient's daughter a call back.

## 2019-10-02 NOTE — Telephone Encounter (Signed)
See the report.

## 2019-10-03 NOTE — Telephone Encounter (Signed)
Left message to call back  

## 2019-10-04 NOTE — Telephone Encounter (Signed)
Left message to call back  

## 2019-10-08 NOTE — Telephone Encounter (Signed)
See monitor results 

## 2019-10-18 ENCOUNTER — Telehealth: Payer: Self-pay | Admitting: Interventional Cardiology

## 2019-10-18 NOTE — Telephone Encounter (Signed)
Per monitor result- you wanted vitals so you could make recommendations.  These are recent readings daughter obtained.

## 2019-10-18 NOTE — Telephone Encounter (Signed)
Medical records received from [facility]. Placed in Dr. Thompson Caul box for review. 10/18/19 vlm

## 2019-10-18 NOTE — Telephone Encounter (Signed)
Patient's daughter reported BP readings that were requested   10-11-19   120/73 HR 70  10-13-19   98/63 HR 81  10-13-19   111/65 HR 67 (30 mins after earlier reading)  10-14-19   110/72 HR in the 70's

## 2019-10-19 NOTE — Telephone Encounter (Signed)
I would recommend discontinuing HCTZ.  Her blood pressures are relatively low and discontinuation of this medication may help prevent further fainting episodes.

## 2019-10-22 NOTE — Telephone Encounter (Signed)
Spoke with daughter and made her aware of recommendations.  Daughter verbalized understanding and was appreciative for call.  

## 2020-02-22 ENCOUNTER — Telehealth: Payer: Self-pay | Admitting: Podiatry

## 2020-02-22 NOTE — Telephone Encounter (Signed)
My mother Tracy Faulkner has an appointment scheduled for April 5th and I needed to give someone her Medicare information. I can be reached at [phone]. Thank you.

## 2020-03-03 ENCOUNTER — Ambulatory Visit: Payer: Medicare PPO | Admitting: Podiatry

## 2020-03-03 ENCOUNTER — Encounter: Payer: Self-pay | Admitting: Podiatry

## 2020-03-03 ENCOUNTER — Other Ambulatory Visit: Payer: Self-pay

## 2020-03-03 DIAGNOSIS — M79671 Pain in right foot: Secondary | ICD-10-CM | POA: Diagnosis not present

## 2020-03-03 DIAGNOSIS — M79674 Pain in right toe(s): Secondary | ICD-10-CM | POA: Diagnosis not present

## 2020-03-03 DIAGNOSIS — B351 Tinea unguium: Secondary | ICD-10-CM

## 2020-03-03 DIAGNOSIS — Q828 Other specified congenital malformations of skin: Secondary | ICD-10-CM

## 2020-03-03 DIAGNOSIS — M79675 Pain in left toe(s): Secondary | ICD-10-CM | POA: Diagnosis not present

## 2020-03-03 MED ORDER — CICLOPIROX 8 % EX SOLN: Freq: Every day | CUTANEOUS | 2 refills | Status: DC

## 2020-03-03 NOTE — Patient Instructions (Signed)
Ciclopirox nail solution What is this medicine? CICLOPIROX (sye kloe PEER ox) NAIL SOLUTION is an antifungal medicine. It used to treat fungal infections of the nails. This medicine may be used for other purposes; ask your health care provider or pharmacist if you have questions. COMMON BRAND NAME(S): [id number], [place] What should I tell my health care provider before I take this medicine? They need to know if you have any of these conditions:  diabetes mellitus  history of seizures  HIV infection  immune system problems or organ transplant  large areas of burned or damaged skin  peripheral vascular disease or poor circulation  taking corticosteroid medication (including steroid inhalers, cream, or lotion)  an unusual or allergic reaction to ciclopirox, isopropyl alcohol, other medicines, foods, dyes, or preservatives  pregnant or trying to get pregnant  breast-feeding How should I use this medicine? This medicine is for external use only. Follow the directions that come with this medicine exactly. Wash and dry your hands before use. Avoid contact with the eyes, mouth or nose. If you do get this medicine in your eyes, rinse out with plenty of cool tap water. Contact your doctor or health care professional if eye irritation occurs. Use at regular intervals. Do not use your medicine more often than directed. Finish the full course prescribed by your doctor or health care professional even if you think you are better. Do not stop using except on your doctor's advice. Talk to your pediatrician regarding the use of this medicine in children. While this medicine may be prescribed for children as young as 12 years for selected conditions, precautions do apply. Overdosage: If you think you have taken too much of this medicine contact a poison control center or emergency room at once. NOTE: This medicine is only for you. Do not share this medicine with others. What if I miss a dose? If you miss a  dose, use it as soon as you can. If it is almost time for your next dose, use only that dose. Do not use double or extra doses. What may interact with this medicine? Interactions are not expected. Do not use any other skin products without telling your doctor or health care professional. This list may not describe all possible interactions. Give your health care provider a list of all the medicines, herbs, non-prescription drugs, or dietary supplements you use. Also tell them if you smoke, drink alcohol, or use illegal drugs. Some items may interact with your medicine. What should I watch for while using this medicine? Tell your doctor or health care professional if your symptoms get worse. Four to six months of treatment may be needed for the nail(s) to improve. Some people may not achieve a complete cure or clearing of the nails by this time. Tell your doctor or health care professional if you develop sores or blisters that do not heal properly. If your nail infection returns after stopping using this product, contact your doctor or health care professional. What side effects may I notice from receiving this medicine? Side effects that you should report to your doctor or health care professional as soon as possible:  allergic reactions like skin rash, itching or hives, swelling of the face, lips, or tongue  severe irritation, redness, burning, blistering, peeling, swelling, oozing Side effects that usually do not require medical attention (report to your doctor or health care professional if they continue or are bothersome):  mild reddening of the skin  nail discoloration  temporary burning or mild   stinging at the site of application This list may not describe all possible side effects. Call your doctor for medical advice about side effects. You may report side effects to FDA at 1-800-FDA-1088. Where should I keep my medicine? Keep out of the reach of children. Store at room temperature  between 15 and 30 degrees C (59 and 86 degrees F). Do not freeze. Protect from light by storing the bottle in the carton after every use. This medicine is flammable. Keep away from heat and flame. Throw away any unused medicine after the expiration date. NOTE: This sheet is a summary. It may not cover all possible information. If you have questions about this medicine, talk to your doctor, pharmacist, or health care provider.  2020 Elsevier/Gold Standard (2008-02-19 16:49:20)  

## 2020-03-03 NOTE — Progress Notes (Signed)
Subjective:   Patient ID: Tracy Faulkner, female   DOB: 86 y.o.   MRN: [id number]   HPI 86 year old female presents the office today for concerns of painful skin lesion right foot submetatarsal 1 as well as for her nails becoming thickened discolored.  Nails do calcification discomfort is along the for greater than 1 year.  She gets pain to the ball of her right foot as well for 2 years and feels much better with wearing shoes.  Hurts with pressure.  No drainage.  She said the area is hard and tender at times.  No redness or drainage or any swelling.   Review of Systems  All other systems reviewed and are negative.   Past Medical History:  Diagnosis Date  . Anemia   . Arthritis   . Ataxia   . Back problem   . Bowel trouble   . Breast cancer ([place])   . Carpal tunnel syndrome   . Constipation   . Disturbance, sleep   . Dyspnea    increased exertion   . Frequent headaches   . History of urinary tract infection   . Hypertension   . Obesity   . Pain in both knees   . Urinary incontinence     Past Surgical History:  Procedure Laterality Date  . BREAST SURGERY     for cancer-right breast  . COLON SURGERY     secondary to bowel blockages  . EYE SURGERY     cataract surgery bilateral   . gastric stapling    . KNEE ARTHROPLASTY Left 10/07/2016   Procedure: LEFT TOTAL KNEE ARTHROPLASTY WITH COMPUTER NAVIGATION;  Surgeon: Rod Can, MD;  Location: [facility];  Service: Orthopedics;  Laterality: Left;     Current Outpatient Medications:  .  BIOTIN PO, Take 1 tablet by mouth daily., Disp: , Rfl:  .  butalbital-acetaminophen-caffeine (FIORICET, ESGIC) 50-325-40 MG tablet, Take 1 tablet by mouth every 6 (six) hours as needed for headache., Disp: , Rfl:  .  cephALEXin (KEFLEX) 500 MG capsule, cephalexin 500 mg capsule, Disp: , Rfl:  .  Cholecalciferol (VITAMIN D3) 10000 units TABS, Take 1,000 Units by mouth daily., Disp: , Rfl:  .  ciclopirox (PENLAC) 8 % solution, Apply topically at  bedtime. Apply over nail and surrounding skin. Apply daily over previous coat. After seven (7) days, may remove with alcohol and continue cycle., Disp: 6.6 mL, Rfl: 2 .  CycloSPORINE (RESTASIS OP), Place 1 drop into both eyes 2 (two) times daily., Disp: , Rfl:  .  docusate sodium (COLACE) 100 MG capsule, Take 1 capsule (100 mg total) by mouth 2 (two) times daily., Disp: 10 capsule, Rfl: 0 .  donepezil (ARICEPT) 5 MG tablet, donepezil 5 mg tablet, Disp: , Rfl:  .  Ferrous Fumarate (HEMOCYTE - 106 MG FE) 324 (106 Fe) MG TABS tablet, ferrous fumarate 324 mg (106 mg iron) tablet  Take 1 tablet every day by oral route., Disp: , Rfl:  .  HYDROcodone-acetaminophen (NORCO/VICODIN) 5-325 MG tablet, Take 1-2 tablets by mouth every 4 (four) hours as needed (breakthrough pain)., Disp: 30 tablet, Rfl: 0 .  Influenza Vac A&B Surf Ant Adj (FLUAD) 0.5 ML SUSY, Fluad 2019-20 32yrup(PF)45 mcg(15 mcgx3)/0.5 mL intramuscular syringe, Disp: , Rfl:  .  Influenza vac split quadrivalent PF (FLUZONE HIGH-DOSE) 0.5 ML injection, Fluzone High-Dose 2019-20 (PF) 180 mcg/0.5 mL intramuscular syringe, Disp: , Rfl:  .  losartan (COZAAR) 50 MG tablet, Take 50 mg by mouth daily., Disp: ,  Rfl:  .  losartan-hydrochlorothiazide (HYZAAR) 50-12.5 MG tablet, losartan 50 mg-hydrochlorothiazide 12.5 mg tablet  Take 1 tablet(s) every day by oral route., Disp: , Rfl:  .  omeprazole (PRILOSEC) 40 MG capsule, TAKE 1 CAPSULE BY MOUTH ONCE DAILY BEFORE BREAKFAST, Disp: , Rfl:  .  ondansetron (ZOFRAN) 4 MG tablet, Take 1 tablet (4 mg total) by mouth every 6 (six) hours as needed for nausea., Disp: 20 tablet, Rfl: 0 .  piroxicam (FELDENE) 20 MG capsule, piroxicam 20 mg capsule, Disp: , Rfl:  .  pneumococcal 23 valent vaccine (PNEUMOVAX 23) 25 MCG/0.5ML injection, Pneumovax-23 25 mcg/0.5 mL injection syringe, Disp: , Rfl:  .  prednisoLONE acetate (PRED FORTE) 1 % ophthalmic suspension, prednisolone acetate 1 % eye drops,suspension, Disp: , Rfl:  .   senna (SENOKOT) 8.6 MG TABS tablet, Take 2 tablets (17.2 mg total) by mouth at bedtime., Disp: 120 each, Rfl: 0 .  sucralfate (CARAFATE) 1 g tablet, sucralfate 1 gram tablet, Disp: , Rfl:  .  Tdap (BOOSTRIX) 5-2.5-18.5 LF-MCG/0.5 injection, Boostrix Tdap 2.5 Lf unit-8 mcg-5 Lf/0.5 mL intramuscular syringe, Disp: , Rfl:  .  triamcinolone cream (KENALOG) 0.1 %, Apply 1 application topically daily as needed for rash., Disp: , Rfl:  .  Zoster Vaccine Adjuvanted (SHINGRIX) injection, Shingrix (PF) 50 mcg/0.5 mL intramuscular suspension, kit, Disp: , Rfl:   Allergies  Allergen Reactions  . Ace Inhibitors Cough  . Lortab [Hydrocodone-Acetaminophen]     Bad dreams  . Remeron [Mirtazapine] Other (See Comments)    Hallucinations (can take 1/2 tablet--7.5 mg), not 15 mg  . Penicillins Rash and Other (See Comments)    Throat swelling, Has patient had a PCN reaction causing immediate rash, facial/tongue/throat swelling, SOB or lightheadedness with hypotension: Yes Has patient had a PCN reaction causing severe rash involving mucus membranes or skin necrosis: No Has patient had a PCN reaction that required hospitalization No Has patient had a PCN reaction occurring within the last 10 years: No If all of the above answers are "NO", then may proceed with Cephalosporin use.         Objective:  Physical Exam  General: NAD  Dermatological: Nails are hypertrophic, dystrophic, brittle, discolored, elongated 10.  Nails have yellow-brown discoloration as well as some darkened discoloration of all the nails.  No surrounding redness or drainage. Tenderness nails 1-5 bilaterally. No open lesions or pre-ulcerative lesions are identified today.  Vascular: Dorsalis Pedis artery and Posterior Tibial artery pedal pulses are 2/4 bilateral with immedate capillary fill time.  There is no pain with calf compression, swelling, warmth, erythema.   Neruologic: Grossly intact via light touch bilateral.   Musculoskeletal:  No gross boney pedal deformities bilateral. No pain, crepitus, or limitation noted with foot and ankle range of motion bilateral.       Assessment:   Symptomatic onychomycosis, porokeratosis right foot.     Plan:  -Treatment options discussed including all alternatives, risks, and complications -Etiology of symptoms were discussed -Nails debrided x10 without any complications or bleeding.  She wants to have treatment for nail fungus.  Prescribed Penlac and discussed side effects and use. -Hyperkeratotic lesion debrided x1 without any complications or bleeding.  Dispensed metatarsal gel offloading pads.  Moisturizer daily.  Return in about 3 months (around 06/02/2020).  Trula Slade DPM

## 2020-06-03 ENCOUNTER — Other Ambulatory Visit: Payer: Self-pay

## 2020-06-03 ENCOUNTER — Ambulatory Visit: Payer: Medicare PPO | Admitting: Podiatry

## 2020-06-03 ENCOUNTER — Encounter: Payer: Self-pay | Admitting: Podiatry

## 2020-06-03 VITALS — Temp 97.3°F

## 2020-06-03 DIAGNOSIS — M79674 Pain in right toe(s): Secondary | ICD-10-CM | POA: Diagnosis not present

## 2020-06-03 DIAGNOSIS — D509 Iron deficiency anemia, unspecified: Secondary | ICD-10-CM

## 2020-06-03 DIAGNOSIS — M79672 Pain in left foot: Secondary | ICD-10-CM | POA: Diagnosis not present

## 2020-06-03 DIAGNOSIS — Q828 Other specified congenital malformations of skin: Secondary | ICD-10-CM

## 2020-06-03 DIAGNOSIS — M79671 Pain in right foot: Secondary | ICD-10-CM

## 2020-06-03 DIAGNOSIS — B351 Tinea unguium: Secondary | ICD-10-CM

## 2020-06-03 NOTE — Patient Instructions (Signed)
Diabetes Mellitus and Foot Care Foot care is an important part of your health, especially when you have diabetes. Diabetes may cause you to have problems because of poor blood flow (circulation) to your feet and legs, which can cause your skin to:  Become thinner and drier.  Break more easily.  Heal more slowly.  Peel and crack. You may also have nerve damage (neuropathy) in your legs and feet, causing decreased feeling in them. This means that you may not notice minor injuries to your feet that could lead to more serious problems. Noticing and addressing any potential problems early is the best way to prevent future foot problems. How to care for your feet Foot hygiene  Wash your feet daily with warm water and mild soap. Do not use hot water. Then, pat your feet and the areas between your toes until they are completely dry. Do not soak your feet as this can dry your skin.  Trim your toenails straight across. Do not dig under them or around the cuticle. File the edges of your nails with an emery board or nail file.  Apply a moisturizing lotion or petroleum jelly to the skin on your feet and to dry, brittle toenails. Use lotion that does not contain alcohol and is unscented. Do not apply lotion between your toes. Shoes and socks  Wear clean socks or stockings every day. Make sure they are not too tight. Do not wear knee-high stockings since they may decrease blood flow to your legs.  Wear shoes that fit properly and have enough cushioning. Always look in your shoes before you put them on to be sure there are no objects inside.  To break in new shoes, wear them for just a few hours a day. This prevents injuries on your feet. Wounds, scrapes, corns, and calluses  Check your feet daily for blisters, cuts, bruises, sores, and redness. If you cannot see the bottom of your feet, use a mirror or ask someone for help.  Do not cut corns or calluses or try to remove them with medicine.  If you  find a minor scrape, cut, or break in the skin on your feet, keep it and the skin around it clean and dry. You may clean these areas with mild soap and water. Do not clean the area with peroxide, alcohol, or iodine.  If you have a wound, scrape, corn, or callus on your foot, look at it several times a day to make sure it is healing and not infected. Check for: ? Redness, swelling, or pain. ? Fluid or blood. ? Warmth. ? Pus or a bad smell. General instructions  Do not cross your legs. This may decrease blood flow to your feet.  Do not use heating pads or hot water bottles on your feet. They may burn your skin. If you have lost feeling in your feet or legs, you may not know this is happening until it is too late.  Protect your feet from hot and cold by wearing shoes, such as at the beach or on hot pavement.  Schedule a complete foot exam at least once a year (annually) or more often if you have foot problems. If you have foot problems, report any cuts, sores, or bruises to your health care provider immediately. Contact a health care provider if:  You have a medical condition that increases your risk of infection and you have any cuts, sores, or bruises on your feet.  You have an injury that is not   healing.  You have redness on your legs or feet.  You feel burning or tingling in your legs or feet.  You have pain or cramps in your legs and feet.  Your legs or feet are numb.  Your feet always feel cold.  You have pain around a toenail. Get help right away if:  You have a wound, scrape, corn, or callus on your foot and: ? You have pain, swelling, or redness that gets worse. ? You have fluid or blood coming from the wound, scrape, corn, or callus. ? Your wound, scrape, corn, or callus feels warm to the touch. ? You have pus or a bad smell coming from the wound, scrape, corn, or callus. ? You have a fever. ? You have a red line going up your leg. Summary  Check your feet every day  for cuts, sores, red spots, swelling, and blisters.  Moisturize feet and legs daily.  Wear shoes that fit properly and have enough cushioning.  If you have foot problems, report any cuts, sores, or bruises to your health care provider immediately.  Schedule a complete foot exam at least once a year (annually) or more often if you have foot problems. This information is not intended to replace advice given to you by your health care provider. Make sure you discuss any questions you have with your health care provider. Document Revised: 08/08/2019 Document Reviewed: 12/17/2016 Elsevier Patient Education  2020 [employer]  

## 2020-06-05 NOTE — Progress Notes (Signed)
Subjective:  Patient ID: Tracy Faulkner, female    DOB: 1932-07-01,  MRN: [id number]  86 y.o. female presents with follow up with h/o chronic iron deficient anemia and painful porokeratotic lesion(s) right foot and painful mycotic toenails that limit ambulation. Aggravating factors include weightbearing with and without shoe gear. Pain for both is relieved with periodic professional debridement..    Review of Systems: Negative except as noted in the HPI. Denies N/V/F/Ch. Past Medical History:  Diagnosis Date  . Anemia   . Arthritis   . Ataxia   . Back problem   . Bowel trouble   . Breast cancer ([place])   . Carpal tunnel syndrome   . Constipation   . Disturbance, sleep   . Dyspnea    increased exertion   . Frequent headaches   . History of urinary tract infection   . Hypertension   . Obesity   . Pain in both knees   . Urinary incontinence    Past Surgical History:  Procedure Laterality Date  . BREAST SURGERY     for cancer-right breast  . COLON SURGERY     secondary to bowel blockages  . EYE SURGERY     cataract surgery bilateral   . gastric stapling    . KNEE ARTHROPLASTY Left 10/07/2016   Procedure: LEFT TOTAL KNEE ARTHROPLASTY WITH COMPUTER NAVIGATION;  Surgeon: Rod Can, MD;  Location: [facility];  Service: Orthopedics;  Laterality: Left;   Patient Active Problem List   Diagnosis Date Noted  . Degenerative arthritis of left knee 10/07/2016  . Near syncope 12/31/2015  . Chest discomfort 12/31/2015  . Premature ventricular contractions 12/31/2015  . Breast cancer ([place])   . Disturbance, sleep   . Arthritis   . Anemia   . Frequent headaches   . Back problem   . Bowel trouble   . Obesity   . Carpal tunnel syndrome   . Ataxia   . Hypertension   . Pain in both knees     Current Outpatient Medications:  .  butalbital-acetaminophen-caffeine (FIORICET, ESGIC) 50-325-40 MG tablet, Take 1 tablet by mouth every 6 (six) hours as needed for headache., Disp: , Rfl:  .   Cholecalciferol (VITAMIN D3) 10000 units TABS, Take 1,000 Units by mouth daily., Disp: , Rfl:  .  CycloSPORINE (RESTASIS OP), Place 1 drop into both eyes 2 (two) times daily., Disp: , Rfl:  .  docusate sodium (COLACE) 100 MG capsule, Take 1 capsule (100 mg total) by mouth 2 (two) times daily., Disp: 10 capsule, Rfl: 0 .  Ferrous Fumarate (HEMOCYTE - 106 MG FE) 324 (106 Fe) MG TABS tablet, ferrous fumarate 324 mg (106 mg iron) tablet  Take 1 tablet every day by oral route., Disp: , Rfl:  .  HYDROcodone-acetaminophen (NORCO/VICODIN) 5-325 MG tablet, Take 1-2 tablets by mouth every 4 (four) hours as needed (breakthrough pain)., Disp: 30 tablet, Rfl: 0 .  losartan (COZAAR) 50 MG tablet, Take 50 mg by mouth daily., Disp: , Rfl:  .  omeprazole (PRILOSEC) 40 MG capsule, TAKE 1 CAPSULE BY MOUTH ONCE DAILY BEFORE BREAKFAST, Disp: , Rfl:  .  senna (SENOKOT) 8.6 MG TABS tablet, Take 2 tablets (17.2 mg total) by mouth at bedtime., Disp: 120 each, Rfl: 0 .  BIOTIN PO, Take 1 tablet by mouth daily. (Patient not taking: Reported on 06/03/2020), Disp: , Rfl:  .  ciclopirox (PENLAC) 8 % solution, Apply topically at bedtime. Apply over nail and surrounding skin. Apply daily over previous coat.  After seven (7) days, may remove with alcohol and continue cycle. (Patient not taking: Reported on 06/03/2020), Disp: 6.6 mL, Rfl: 2 .  donepezil (ARICEPT) 5 MG tablet, donepezil 5 mg tablet (Patient not taking: Reported on 06/03/2020), Disp: , Rfl:  .  piroxicam (FELDENE) 20 MG capsule, piroxicam 20 mg capsule (Patient not taking: Reported on 06/03/2020), Disp: , Rfl:  .  sucralfate (CARAFATE) 1 g tablet, sucralfate 1 gram tablet (Patient not taking: Reported on 06/03/2020), Disp: , Rfl:  .  triamcinolone cream (KENALOG) 0.1 %, Apply 1 application topically daily as needed for rash. (Patient not taking: Reported on 06/03/2020), Disp: , Rfl:  Allergies  Allergen Reactions  . Ace Inhibitors Cough  . Lortab [Hydrocodone-Acetaminophen]      Bad dreams  . Remeron [Mirtazapine] Other (See Comments)    Hallucinations (can take 1/2 tablet--7.5 mg), not 15 mg  . Penicillins Rash and Other (See Comments)    Throat swelling, Has patient had a PCN reaction causing immediate rash, facial/tongue/throat swelling, SOB or lightheadedness with hypotension: Yes Has patient had a PCN reaction causing severe rash involving mucus membranes or skin necrosis: No Has patient had a PCN reaction that required hospitalization No Has patient had a PCN reaction occurring within the last 10 years: No If all of the above answers are "NO", then may proceed with Cephalosporin use.    Social History   Tobacco Use  Smoking Status Never Smoker  Smokeless Tobacco Never Used    Objective:   Constitutional Pt is a pleasant 86 y.o. African American female, WD, WN in NAD.Marland Kitchen  Vascular Neurovascular status unchanged b/l lower extremities. Capillary refill time to digits immediate b/l. Palpable pedal pulses b/l LE. Pedal hair sparse. Lower extremity skin temperature gradient within normal limits. No pain with calf compression b/l. No edema noted b/l lower extremities. No cyanosis or clubbing noted.  Neurologic Normal speech. Oriented to person, place, and time. Protective sensation intact 5/5 intact bilaterally with 10g monofilament b/l. Clonus negative b/l.  Dermatologic Pedal skin with normal turgor, texture and tone bilaterally. No open wounds bilaterally. No interdigital macerations bilaterally. Toenails 1-5 b/l elongated, discolored, dystrophic, thickened, crumbly with subungual debris and tenderness to dorsal palpation. Porokeratotic lesion(s) submet head 1 right foot. No erythema, no edema, no drainage, no flocculence.  Orthopedic: Normal muscle strength 5/5 to all lower extremity muscle groups bilaterally. No pain crepitus or joint limitation noted with ROM b/l. No gross bony deformities bilaterally.   Radiographs: None Assessment:   1. Dermatophytosis  of nail   2. Porokeratosis   3. Iron deficiency anemia, unspecified iron deficiency anemia type   4. Right foot pain    Plan:  Patient was evaluated and treated and all questions answered.  Onychomycosis with pain -Nails palliatively debridement as below. -Educated on self-care  Procedure: Nail Debridement Rationale: Pain Type of Debridement: manual, sharp debridement. Instrumentation: Nail nipper, rotary burr. Number of Nails: 10  -Examined patient. -Toenails 1-5 b/l were debrided in length and girth with sterile nail nippers and dremel without iatrogenic bleeding.  -Painful porokeratotic lesion(s) submet head 1 right foot pared and enucleated with sterile scalpel blade without incident. -Patient to report any pedal injuries to medical professional immediately. -Patient to continue soft, supportive shoe gear daily. -Patient/POA to call should there be question/concern in the interim.  Return in about 3 months (around 09/03/2020) for diabetic nail trim.  Marzetta Board, DPM

## 2020-09-03 ENCOUNTER — Encounter: Payer: Self-pay | Admitting: Podiatry

## 2020-09-03 ENCOUNTER — Other Ambulatory Visit: Payer: Self-pay

## 2020-09-03 ENCOUNTER — Ambulatory Visit: Payer: Medicare PPO | Admitting: Podiatry

## 2020-09-03 DIAGNOSIS — B351 Tinea unguium: Secondary | ICD-10-CM

## 2020-09-03 DIAGNOSIS — D509 Iron deficiency anemia, unspecified: Secondary | ICD-10-CM

## 2020-09-03 DIAGNOSIS — M79675 Pain in left toe(s): Secondary | ICD-10-CM

## 2020-09-03 DIAGNOSIS — M79671 Pain in right foot: Secondary | ICD-10-CM | POA: Diagnosis not present

## 2020-09-03 DIAGNOSIS — Q828 Other specified congenital malformations of skin: Secondary | ICD-10-CM | POA: Diagnosis not present

## 2020-09-03 DIAGNOSIS — M79674 Pain in right toe(s): Secondary | ICD-10-CM

## 2020-09-07 NOTE — Progress Notes (Signed)
Subjective:  Patient ID: Tracy Faulkner, female    DOB: 06-09-1932,  MRN: [id number]  86 y.o. female presents with follow up with h/o chronic iron deficient anemia and painful porokeratotic lesion(s) right foot and painful mycotic toenails that limit ambulation. Aggravating factors include weightbearing with and without shoe gear. Pain for both is relieved with periodic professional debridement.   She voices no new pedal problems on today's visit.  Review of Systems: Negative except as noted in the HPI. Denies N/V/F/Ch. Past Medical History:  Diagnosis Date  . Anemia   . Arthritis   . Ataxia   . Back problem   . Bowel trouble   . Breast cancer ([place])   . Carpal tunnel syndrome   . Constipation   . Disturbance, sleep   . Dyspnea    increased exertion   . Frequent headaches   . History of urinary tract infection   . Hypertension   . Obesity   . Pain in both knees   . Urinary incontinence    Past Surgical History:  Procedure Laterality Date  . BREAST SURGERY     for cancer-right breast  . COLON SURGERY     secondary to bowel blockages  . EYE SURGERY     cataract surgery bilateral   . gastric stapling    . KNEE ARTHROPLASTY Left 10/07/2016   Procedure: LEFT TOTAL KNEE ARTHROPLASTY WITH COMPUTER NAVIGATION;  Surgeon: Rod Can, MD;  Location: [facility];  Service: Orthopedics;  Laterality: Left;   Patient Active Problem List   Diagnosis Date Noted  . Iron deficiency anemia 10/05/2017  . Vitamin D deficiency 10/05/2017  . Degenerative arthritis of left knee 10/07/2016  . Near syncope 12/31/2015  . Chest discomfort 12/31/2015  . Premature ventricular contractions 12/31/2015  . Breast cancer ([place])   . Disturbance, sleep   . Arthritis   . Anemia   . Frequent headaches   . Back problem   . Bowel trouble   . Obesity   . Carpal tunnel syndrome   . Ataxia   . Hypertension   . Pain in both knees     Current Outpatient Medications:  .  azithromycin (ZITHROMAX) 250 MG  tablet, , Disp: , Rfl:  .  butalbital-acetaminophen-caffeine (FIORICET, ESGIC) 50-325-40 MG tablet, Take 1 tablet by mouth every 6 (six) hours as needed for headache., Disp: , Rfl:  .  Cholecalciferol (VITAMIN D3) 10000 units TABS, Take 1,000 Units by mouth daily., Disp: , Rfl:  .  ciclopirox (PENLAC) 8 % solution, Apply topically at bedtime. Apply over nail and surrounding skin. Apply daily over previous coat. After seven (7) days, may remove with alcohol and continue cycle., Disp: 6.6 mL, Rfl: 2 .  cycloSPORINE (RESTASIS) 0.05 % ophthalmic emulsion, Restasis 0.05 % eye drops in a dropperette  INSTILL 1 DROP INTO AFFECTED EYE(S) EVERY 12 HOURS, Disp: , Rfl:  .  docusate sodium (COLACE) 100 MG capsule, Take 1 capsule (100 mg total) by mouth 2 (two) times daily., Disp: 10 capsule, Rfl: 0 .  Ferrous Fumarate (FERROCITE) 324 (106 Fe) MG TABS tablet, Ferrocite 324 mg (106 mg iron) tablet  TAKE 1 TABLET BY MOUTH ONCE DAILY, Disp: , Rfl:  .  Influenza Vac High-Dose Quad (FLUZONE HIGH-DOSE QUADRIVALENT) 0.7 ML SUSY, Fluzone High-Dose Quad 2020-21 (PF) 240 mcg/0.7 mL IM syringe  ADM 0.7ML IM UTD, Disp: , Rfl:  .  losartan (COZAAR) 50 MG tablet, Take 50 mg by mouth daily., Disp: , Rfl:  .  omeprazole (  PRILOSEC) 40 MG capsule, TAKE 1 CAPSULE BY MOUTH ONCE DAILY BEFORE BREAKFAST, Disp: , Rfl:  .  polyethylene glycol powder (GLYCOLAX/MIRALAX) 17 GM/SCOOP powder, polyethylene glycol 3350 17 gram/dose oral powder, Disp: , Rfl:  .  senna (SENOKOT) 8.6 MG TABS tablet, Take 2 tablets (17.2 mg total) by mouth at bedtime., Disp: 120 each, Rfl: 0 .  BIOTIN PO, Take 1 tablet by mouth daily. (Patient not taking: Reported on 06/03/2020), Disp: , Rfl:  .  donepezil (ARICEPT) 5 MG tablet, donepezil 5 mg tablet (Patient not taking: Reported on 06/03/2020), Disp: , Rfl:  .  HYDROcodone-acetaminophen (NORCO/VICODIN) 5-325 MG tablet, Take 1-2 tablets by mouth every 4 (four) hours as needed (breakthrough pain). (Patient not taking:  Reported on 09/03/2020), Disp: 30 tablet, Rfl: 0 .  piroxicam (FELDENE) 20 MG capsule, piroxicam 20 mg capsule (Patient not taking: Reported on 06/03/2020), Disp: , Rfl:  .  sucralfate (CARAFATE) 1 g tablet, sucralfate 1 gram tablet (Patient not taking: Reported on 06/03/2020), Disp: , Rfl:  .  triamcinolone cream (KENALOG) 0.1 %, Apply 1 application topically daily as needed for rash. (Patient not taking: Reported on 06/03/2020), Disp: , Rfl:  Allergies  Allergen Reactions  . Ace Inhibitors Cough  . Lortab [Hydrocodone-Acetaminophen]     Bad dreams  . Remeron [Mirtazapine] Other (See Comments)    Hallucinations (can take 1/2 tablet--7.5 mg), not 15 mg  . Penicillins Rash and Other (See Comments)    Throat swelling, Has patient had a PCN reaction causing immediate rash, facial/tongue/throat swelling, SOB or lightheadedness with hypotension: Yes Has patient had a PCN reaction causing severe rash involving mucus membranes or skin necrosis: No Has patient had a PCN reaction that required hospitalization No Has patient had a PCN reaction occurring within the last 10 years: No If all of the above answers are "NO", then may proceed with Cephalosporin use.    Social History   Tobacco Use  Smoking Status Never Smoker  Smokeless Tobacco Never Used    Objective:   Constitutional Pt is a pleasant 86 y.o. African American female, WD, WN in NAD.Marland Kitchen  Vascular Neurovascular status unchanged b/l lower extremities. Capillary refill time to digits immediate b/l. Palpable pedal pulses b/l LE. Pedal hair sparse. Lower extremity skin temperature gradient within normal limits. No pain with calf compression b/l. No edema noted b/l lower extremities. No cyanosis or clubbing noted.  Neurologic Normal speech. Oriented to person, place, and time. Protective sensation intact 5/5 intact bilaterally with 10g monofilament b/l. Clonus negative b/l.  Dermatologic Pedal skin with normal turgor, texture and tone bilaterally. No  open wounds bilaterally. No interdigital macerations bilaterally. Toenails 1-5 b/l elongated, discolored, dystrophic, thickened, crumbly with subungual debris and tenderness to dorsal palpation. Porokeratotic lesion(s) submet head 1 right foot. No erythema, no edema, no drainage, no flocculence.  Orthopedic: Normal muscle strength 5/5 to all lower extremity muscle groups bilaterally. No pain crepitus or joint limitation noted with ROM b/l. No gross bony deformities bilaterally.   Radiographs: None Assessment:   1. Pain due to onychomycosis of toenails of both feet   2. Porokeratosis   3. Iron deficiency anemia, unspecified iron deficiency anemia type   4. Right foot pain    Plan:  Patient was evaluated and treated and all questions answered.  Onychomycosis with pain -Nails palliatively debridement as below. -Educated on self-care  Procedure: Nail Debridement Rationale: Pain Type of Debridement: manual, sharp debridement. Instrumentation: Nail nipper, rotary burr. Number of Nails: 10  -Examined patient. -No  new findings. No new orders. -Toenails 1-5 b/l were debrided in length and girth with sterile nail nippers and dremel without iatrogenic bleeding.  -Painful porokeratotic lesion(s) submet head 1 right foot pared and enucleated with sterile scalpel blade without incident. -Patient to report any pedal injuries to medical professional immediately. -Patient to continue soft, supportive shoe gear daily. -Patient/POA to call should there be question/concern in the interim.  Return in about 3 months (around 12/04/2020).  Marzetta Board, DPM

## 2020-12-05 ENCOUNTER — Ambulatory Visit: Payer: Medicare PPO | Admitting: Podiatry

## 2020-12-24 ENCOUNTER — Ambulatory Visit: Payer: Medicare PPO | Admitting: Podiatry

## 2020-12-24 ENCOUNTER — Other Ambulatory Visit: Payer: Self-pay

## 2020-12-24 DIAGNOSIS — R2681 Unsteadiness on feet: Secondary | ICD-10-CM

## 2020-12-24 DIAGNOSIS — Q666 Other congenital valgus deformities of feet: Secondary | ICD-10-CM

## 2020-12-24 DIAGNOSIS — Z9181 History of falling: Secondary | ICD-10-CM

## 2020-12-24 DIAGNOSIS — R2689 Other abnormalities of gait and mobility: Secondary | ICD-10-CM | POA: Diagnosis not present

## 2020-12-25 ENCOUNTER — Encounter: Payer: Self-pay | Admitting: Podiatry

## 2020-12-25 NOTE — Progress Notes (Signed)
Subjective:  Patient ID: Tracy Faulkner, female    DOB: Sep 26, 1932,  MRN: [id number]  Chief Complaint  Patient presents with  . Nail Problem    3 month follow up diabetic nail trim    87 y.o. female presents with the above complaint.  Patient presents with complaint of history of falls with instability to both of her foot secondary to pes planovalgus.  She states that she has fallen multiple times in the last year or so.  She will states that her she has a lot of instability in the ankle and her foot that leads to her to fall down.  She has never had any major injury with the fall.  She would like to discuss something for the flatfoot as well as her history of fall with some kind of bracing.  She denies any other acute complaints.  She has not seen anyone else prior to see me for this particular thing.  She is known to our clinic for diabetic foot care.   Review of Systems: Negative except as noted in the HPI. Denies N/V/F/Ch.  Past Medical History:  Diagnosis Date  . Anemia   . Arthritis   . Ataxia   . Back problem   . Bowel trouble   . Breast cancer ([place])   . Carpal tunnel syndrome   . Constipation   . Disturbance, sleep   . Dyspnea    increased exertion   . Frequent headaches   . History of urinary tract infection   . Hypertension   . Obesity   . Pain in both knees   . Urinary incontinence     Current Outpatient Medications:  .  azithromycin (ZITHROMAX) 250 MG tablet, , Disp: , Rfl:  .  BIOTIN PO, Take 1 tablet by mouth daily. (Patient not taking: Reported on 06/03/2020), Disp: , Rfl:  .  butalbital-acetaminophen-caffeine (FIORICET, ESGIC) 50-325-40 MG tablet, Take 1 tablet by mouth every 6 (six) hours as needed for headache., Disp: , Rfl:  .  Cholecalciferol (VITAMIN D3) 10000 units TABS, Take 1,000 Units by mouth daily., Disp: , Rfl:  .  ciclopirox (PENLAC) 8 % solution, Apply topically at bedtime. Apply over nail and surrounding skin. Apply daily over previous coat. After  seven (7) days, may remove with alcohol and continue cycle., Disp: 6.6 mL, Rfl: 2 .  cycloSPORINE (RESTASIS) 0.05 % ophthalmic emulsion, Restasis 0.05 % eye drops in a dropperette  INSTILL 1 DROP INTO AFFECTED EYE(S) EVERY 12 HOURS, Disp: , Rfl:  .  docusate sodium (COLACE) 100 MG capsule, Take 1 capsule (100 mg total) by mouth 2 (two) times daily., Disp: 10 capsule, Rfl: 0 .  donepezil (ARICEPT) 5 MG tablet, donepezil 5 mg tablet (Patient not taking: Reported on 06/03/2020), Disp: , Rfl:  .  Ferrous Fumarate (FERROCITE) 324 (106 Fe) MG TABS tablet, Ferrocite 324 mg (106 mg iron) tablet  TAKE 1 TABLET BY MOUTH ONCE DAILY, Disp: , Rfl:  .  HYDROcodone-acetaminophen (NORCO/VICODIN) 5-325 MG tablet, Take 1-2 tablets by mouth every 4 (four) hours as needed (breakthrough pain). (Patient not taking: Reported on 09/03/2020), Disp: 30 tablet, Rfl: 0 .  Influenza Vac High-Dose Quad (FLUZONE HIGH-DOSE QUADRIVALENT) 0.7 ML SUSY, Fluzone High-Dose Quad 2020-21 (PF) 240 mcg/0.7 mL IM syringe  ADM 0.7ML IM UTD, Disp: , Rfl:  .  losartan (COZAAR) 50 MG tablet, Take 50 mg by mouth daily., Disp: , Rfl:  .  omeprazole (PRILOSEC) 40 MG capsule, TAKE 1 CAPSULE BY MOUTH ONCE DAILY  BEFORE BREAKFAST, Disp: , Rfl:  .  piroxicam (FELDENE) 20 MG capsule, piroxicam 20 mg capsule (Patient not taking: Reported on 06/03/2020), Disp: , Rfl:  .  polyethylene glycol powder (GLYCOLAX/MIRALAX) 17 GM/SCOOP powder, polyethylene glycol 3350 17 gram/dose oral powder, Disp: , Rfl:  .  senna (SENOKOT) 8.6 MG TABS tablet, Take 2 tablets (17.2 mg total) by mouth at bedtime., Disp: 120 each, Rfl: 0 .  sucralfate (CARAFATE) 1 g tablet, sucralfate 1 gram tablet (Patient not taking: Reported on 06/03/2020), Disp: , Rfl:  .  triamcinolone cream (KENALOG) 0.1 %, Apply 1 application topically daily as needed for rash. (Patient not taking: Reported on 06/03/2020), Disp: , Rfl:   Social History   Tobacco Use  Smoking Status Never Smoker  Smokeless Tobacco  Never Used    Allergies  Allergen Reactions  . Ace Inhibitors Cough  . Lortab [Hydrocodone-Acetaminophen]     Bad dreams  . Remeron [Mirtazapine] Other (See Comments)    Hallucinations (can take 1/2 tablet--7.5 mg), not 15 mg  . Penicillins Rash and Other (See Comments)    Throat swelling, Has patient had a PCN reaction causing immediate rash, facial/tongue/throat swelling, SOB or lightheadedness with hypotension: Yes Has patient had a PCN reaction causing severe rash involving mucus membranes or skin necrosis: No Has patient had a PCN reaction that required hospitalization No Has patient had a PCN reaction occurring within the last 10 years: No If all of the above answers are "NO", then may proceed with Cephalosporin use.    Objective:  There were no vitals filed for this visit. There is no height or weight on file to calculate BMI. Constitutional Well developed. Well nourished.  Vascular Dorsalis pedis pulses palpable bilaterally. Posterior tibial pulses palpable bilaterally. Capillary refill normal to all digits.  No cyanosis or clubbing noted. Pedal hair growth normal.  Neurologic Normal speech. Oriented to person, place, and time. Epicritic sensation to light touch grossly present bilaterally.  Dermatologic Nails well groomed and normal in appearance. No open wounds. No skin lesions.  Orthopedic:  Gait examination shows severe pes planovalgus that is rigid in nature with calcaneal valgus to many toe signs.  Flexibility in the ankle joint consistent with chronic ankle instability.  Negative anterior drawer or talar tilt test.   Radiographs: None Assessment:   1. Personal history of fall   2. Pes planovalgus   3. Gait instability   4. Antalgic gait    Plan:  Patient was evaluated and treated and all questions answered.  Bilateral pes planovalgus with gait instability secondary with underlying history of falls -I explained to the patient the etiology of pes  planovalgus and various treatment options were discussed.  Given that her flatfoot is not supported it is out toeing her foot leading to instability the ankle and therefore possible underlying falls due to instability.  I discussed this with the patient in extensive detail.  Patient states understanding would like to proceed with [facility]  balance bracing. -She will be scheduled to see Liliane Channel for [facility] balance bracing  No follow-ups on file.

## 2021-01-05 ENCOUNTER — Other Ambulatory Visit: Payer: Medicare PPO | Admitting: Orthotics

## 2021-01-12 ENCOUNTER — Ambulatory Visit: Payer: Medicare PPO | Admitting: Orthotics

## 2021-01-12 ENCOUNTER — Other Ambulatory Visit: Payer: Self-pay

## 2021-01-12 DIAGNOSIS — Z9181 History of falling: Secondary | ICD-10-CM

## 2021-01-12 DIAGNOSIS — R2681 Unsteadiness on feet: Secondary | ICD-10-CM

## 2021-01-12 DIAGNOSIS — Q666 Other congenital valgus deformities of feet: Secondary | ICD-10-CM

## 2021-01-12 NOTE — Progress Notes (Signed)
Talked extensively about her needs, h/o falls, cost of balance bracing etc.  She presents with her daughter.    After much discussion of pros vs cons, foot comfort etc, her daughter decided that trying to find a COMFORTABLE shoe is the priority over bracing, and "she can't find any comfortable shoes", I suggested she first go to [employer] and get fitted with shoes that would address her bunions; then I could create an insert that would be soft and offload bunion plantar surface.  Both daughter and she felt that was proper course of action.   She wasn't concerned too much about falls, espeically given her co-insturance obligations.

## 2021-01-27 ENCOUNTER — Telehealth: Payer: Self-pay | Admitting: Interventional Cardiology

## 2021-01-27 NOTE — Telephone Encounter (Signed)
Left message to call back  

## 2021-01-27 NOTE — Telephone Encounter (Signed)
Pt c/o of Chest Pain: STAT if CP now or developed within 24 hours  1. Are you having CP right now?  No   2. Are you experiencing any other symptoms (ex. SOB, nausea, vomiting, sweating)? Pt denies any of this symptoms   3. How long have you been experiencing CP? Over the last 2 week and just progressed into a little more   4. Is your CP continuous or coming and going? Coming and going   5. Have you taken Nitroglycerin? No   Daughter stated she woke up in th mid of the night finding it hard to catch her breath.  She denies any SOB right now.  She is not sure if meds need to be adjust or just needs to come and be checked.  No appt available with APP or Tamala Julian until April   Best number - [phone] ?

## 2021-01-27 NOTE — Telephone Encounter (Signed)
Spoke with daughter and pt.  Pt has been having intermittent CP and SOB for about 2 weeks but worsened over the weekend.  Denies any symptoms right now.  Does have sweating and HA at times with these episodes.  No vitals available.  Has CP at night and during the day.   Nothing seems to make it worse or better.  Pt does have night terrors at times and wakes up with CP.  Symptoms were bad enough over the weekend that the daughter almost called 911.  They are in the process of moving pt back to [facility] from [facility].  Scheduled pt to come in and be seen by DOD, Dr. Harrington Challenger, on Thursday.  Reviewed ER protocol.  Both verbalized understanding and was appreciative for call.

## 2021-01-29 ENCOUNTER — Encounter: Payer: Self-pay | Admitting: Internal Medicine

## 2021-01-29 ENCOUNTER — Ambulatory Visit: Payer: Medicare PPO | Admitting: Internal Medicine

## 2021-01-29 ENCOUNTER — Other Ambulatory Visit: Payer: Self-pay

## 2021-01-29 ENCOUNTER — Ambulatory Visit: Payer: Medicare PPO | Admitting: Interventional Cardiology

## 2021-01-29 VITALS — BP 128/74 | HR 66 | Ht 66.5 in | Wt 176.2 lb

## 2021-01-29 DIAGNOSIS — R0602 Shortness of breath: Secondary | ICD-10-CM

## 2021-01-29 DIAGNOSIS — R079 Chest pain, unspecified: Secondary | ICD-10-CM | POA: Diagnosis not present

## 2021-01-29 DIAGNOSIS — R002 Palpitations: Secondary | ICD-10-CM | POA: Diagnosis not present

## 2021-01-29 DIAGNOSIS — Z0000 Encounter for general adult medical examination without abnormal findings: Secondary | ICD-10-CM

## 2021-01-29 NOTE — Patient Instructions (Addendum)
Medication Instructions:  No changes *If you need a refill on your cardiac medications before your next appointment, please call your pharmacy*   Lab Work: Today: cbc, cmet, tsh, bnp, lipids, urinalysis  If you have labs (blood work) drawn today and your tests are completely normal, you will receive your results only by: Marland Kitchen MyChart Message (if you have MyChart) OR . A paper copy in the mail If you have any lab test that is abnormal or we need to change your treatment, we will call you to review the results.   Testing/Procedures: Your physician has recommended that you wear an event monitor. Event monitors are medical devices that record the heart's electrical activity. Doctors most often [place] these monitors to diagnose arrhythmias. Arrhythmias are problems with the speed or rhythm of the heartbeat. The monitor is a small, portable device. You can wear one while you do your normal daily activities. This is usually used to diagnose what is causing palpitations/syncope (passing out).  Your physician has requested that you have an echocardiogram. Echocardiography is a painless test that uses sound waves to create images of your heart. It provides your doctor with information about the size and shape of your heart and how well your heart's chambers and valves are working. This procedure takes approximately one hour. There are no restrictions for this procedure.   Follow-Up: Follow up with your physician will depend on test results.  Other Instructions

## 2021-01-29 NOTE — Progress Notes (Unsigned)
Cardiology Office Note   Date:  01/29/2021   ID:  Tracy Faulkner, Tracy Faulkner 1932-04-03, MRN [id number]  PCP:  Duwaine Maxin, MD  Cardiologist:   Dorris Carnes, MD    Pt presents for evaluation of SOB     History of Present Illness: Tracy Faulkner is a 87 y.o. female who is usually followed by Tracy Faulkner  last seen in 2020   She has a history of palpitations, HTN, obesity and CP    Hx of PVCs  Also hx of near syncope   Set up for monitor  Monitor showed freq PACs and brief runs of PSVT.    The pt comes  In with daughter today   Daughter helps with history   Pt notes some short windedness,   Also some bad fluttering of heart  She also has experienced some chest discomfort   Discomfort not assocatiate with activity.  Overall the pt's actvity is limited  She has fallen some       Current Meds  Medication Sig  . Cholecalciferol (VITAMIN D3) 10000 units TABS Take 1,000 Units by mouth daily.  . ciclopirox (PENLAC) 8 % solution Apply topically at bedtime. Apply over nail and surrounding skin. Apply daily over previous coat. After seven (7) days, may remove with alcohol and continue cycle.  . cycloSPORINE (RESTASIS) 0.05 % ophthalmic emulsion Restasis 0.05 % eye drops in a dropperette  INSTILL 1 DROP INTO AFFECTED EYE(S) EVERY 12 HOURS  . docusate sodium (COLACE) 100 MG capsule Take 1 capsule (100 mg total) by mouth 2 (two) times daily.  Marland Kitchen losartan (COZAAR) 50 MG tablet Take 50 mg by mouth daily.  . Multiple Vitamin (MULTIVITAMIN) tablet Take 1 tablet by mouth daily.  . Omega-3 Fatty Acids (FISH OIL PO) Take 1 capsule by mouth daily.  Marland Kitchen omeprazole (PRILOSEC) 40 MG capsule TAKE 1 CAPSULE BY MOUTH ONCE DAILY BEFORE BREAKFAST  . sucralfate (CARAFATE) 1 g tablet sucralfate 1 gram tablet     Allergies:   Ace inhibitors, Lortab [hydrocodone-acetaminophen], Remeron [mirtazapine], and Penicillins   Past Medical History:  Diagnosis Date  . Anemia   . Arthritis   . Ataxia   . Back problem   . Bowel  trouble   . Breast cancer ([place])   . Carpal tunnel syndrome   . Constipation   . Disturbance, sleep   . Dyspnea    increased exertion   . Frequent headaches   . History of urinary tract infection   . Hypertension   . Obesity   . Pain in both knees   . Urinary incontinence     Past Surgical History:  Procedure Laterality Date  . BREAST SURGERY     for cancer-right breast  . COLON SURGERY     secondary to bowel blockages  . EYE SURGERY     cataract surgery bilateral   . gastric stapling    . KNEE ARTHROPLASTY Left 10/07/2016   Procedure: LEFT TOTAL KNEE ARTHROPLASTY WITH COMPUTER NAVIGATION;  Surgeon: Rod Can, MD;  Location: [facility];  Service: Orthopedics;  Laterality: Left;     Social History:  The patient  reports that she has never smoked. She has never used smokeless tobacco. She reports that she does not drink alcohol and does not use drugs.   Family History:  The patient's family history includes Arthritis in her mother; Cancer in her mother; Diabetes Mellitus II in her mother; Hypertension in her mother; Kidney disease in her mother.  ROS:  Please see the history of present illness. All other systems are reviewed and  Negative to the above problem except as noted.    PHYSICAL EXAM: VS:  BP 128/74   Pulse 66   Ht 5' 6.5" (1.689 m)   Wt 176 lb 3.2 oz (79.9 kg)   BMI 28.01 kg/m   GEN:   Frail 87 yo in NAD  Examined in chair as she was unable to get up to table   HEENT: normal  Neck: no JVD, carotid bruits, or masses Cardiac: RRR; no murmurs, rubs, or gallops, 1+LE edema  Respiratory:  clear to auscultation bilaterally, normal work of breathing GI: soft, nontender, nondistended, + BS  No hepatomegaly  MS: no deformity Moving all extremities   Skin: warm and dry, no rash Neuro:  Strength and sensation are intact Psych: euthymic mood, full affect   EKG:  EKG is ordered today.  SR with PACs      Lipid Panel No results found for: CHOL, TRIG, HDL,  CHOLHDL, VLDL, LDLCALC, LDLDIRECT    Wt Readings from Last 3 Encounters:  01/29/21 176 lb 3.2 oz (79.9 kg)  08/02/19 185 lb (83.9 kg)  11/15/16 203 lb (92.1 kg)      ASSESSMENT AND PLAN:  1  Dyspnea  The pt has had worsening dyspnea with exertion   She is very frail   Exam today is without significant volume overload. I would recomm an echo to reevaluate LVEF/RVEF   Further testing based on results  2  "Fluttering"   Will set up for event montior   3  HTN  BP is controlled today     4  Chest pressure   Start with echo and labs  Continue to follow     Will check CBC, CMBET, TSH, BNP, UA and lipids today    Current medicines are reviewed at length with the patient today.  The patient does not have concerns regarding medicines.  Signed, Dorris Carnes, MD  01/29/2021 3:58 PM    [facility] [address], [place], [place]  [address] Phone: [phone]; Fax: [phone]

## 2021-01-30 ENCOUNTER — Encounter: Payer: Self-pay | Admitting: Radiology

## 2021-01-30 LAB — URINALYSIS
Bilirubin, UA: NEGATIVE
Glucose, UA: NEGATIVE
Nitrite, UA: NEGATIVE
Protein,UA: NEGATIVE
RBC, UA: NEGATIVE
Specific Gravity, UA: >=1.03 — AB (ref 1.005–1.030)
Urobilinogen, Ur: 0.2 mg/dL (ref 0.2–1.0)
pH, UA: 5 (ref 5.0–7.5)

## 2021-01-30 LAB — CBC: RDW: 14.8 % (ref 11.7–15.4)

## 2021-01-30 LAB — COMPREHENSIVE METABOLIC PANEL
BUN: 22 mg/dL (ref 8–27)
CO2: 21 mmol/L (ref 20–29)
Creatinine, Ser: 1.18 mg/dL — ABNORMAL HIGH (ref 0.57–1.00)
Sodium: 141 mmol/L (ref 134–144)

## 2021-01-30 NOTE — Progress Notes (Signed)
Enrolled patient for a 30 day [employer] Event Monitor to be mailed to patients home  

## 2021-02-05 LAB — PRO B NATRIURETIC PEPTIDE: NT-Pro BNP: 263 pg/mL (ref 0–738)

## 2021-02-06 LAB — CBC
Hematocrit: 33.7 % — ABNORMAL LOW (ref 34.0–46.6)
Hemoglobin: 10.7 g/dL — ABNORMAL LOW (ref 11.1–15.9)
MCH: 25.8 pg — ABNORMAL LOW (ref 26.6–33.0)
MCHC: 31.8 g/dL (ref 31.5–35.7)
MCV: 81 fL (ref 79–97)
NRBC: 1 % — ABNORMAL HIGH (ref 0–0)
Platelets: 194 10*3/uL (ref 150–450)
RBC: 4.14 x10E6/uL (ref 3.77–5.28)
WBC: 5.5 10*3/uL (ref 3.4–10.8)

## 2021-02-06 LAB — COMPREHENSIVE METABOLIC PANEL
ALT: 14 IU/L (ref 0–32)
AST: 18 IU/L (ref 0–40)
Albumin/Globulin Ratio: 1.4 (ref 1.2–2.2)
Albumin: 3.8 g/dL (ref 3.6–4.6)
Alkaline Phosphatase: 65 IU/L (ref 44–121)
BUN/Creatinine Ratio: 19 (ref 12–28)
Bilirubin Total: 0.4 mg/dL (ref 0.0–1.2)
Calcium: 9.3 mg/dL (ref 8.7–10.3)
Chloride: 108 mmol/L — ABNORMAL HIGH (ref 96–106)
Globulin, Total: 2.8 g/dL (ref 1.5–4.5)
Glucose: 119 mg/dL — ABNORMAL HIGH (ref 65–99)
Potassium: 4.4 mmol/L (ref 3.5–5.2)
Total Protein: 6.6 g/dL (ref 6.0–8.5)
eGFR: 45 mL/min/{1.73_m2} — ABNORMAL LOW (ref >59)

## 2021-02-06 LAB — TSH: TSH: 1.2 u[IU]/mL (ref 0.450–4.500)

## 2021-02-06 LAB — LIPOPROTEIN A (LPA): Lipoprotein (a): 34.6 nmol/L (ref <75.0)

## 2021-02-27 ENCOUNTER — Other Ambulatory Visit: Payer: Self-pay

## 2021-02-27 ENCOUNTER — Ambulatory Visit (HOSPITAL_COMMUNITY): Payer: Medicare PPO | Attending: Cardiology

## 2021-02-27 DIAGNOSIS — R079 Chest pain, unspecified: Secondary | ICD-10-CM

## 2021-02-27 DIAGNOSIS — R002 Palpitations: Secondary | ICD-10-CM

## 2021-02-27 DIAGNOSIS — R0602 Shortness of breath: Secondary | ICD-10-CM

## 2021-02-27 LAB — ECHOCARDIOGRAM COMPLETE
Area-P 1/2: 3.99 cm2
S' Lateral: 2.3 cm

## 2021-03-06 ENCOUNTER — Ambulatory Visit: Payer: Medicare PPO | Admitting: Family

## 2021-03-06 ENCOUNTER — Other Ambulatory Visit: Payer: Self-pay

## 2021-03-06 ENCOUNTER — Encounter: Payer: Self-pay | Admitting: Family

## 2021-03-06 VITALS — BP 128/70 | HR 67 | Temp 97.5°F | Resp 16 | Ht 66.5 in | Wt 176.2 lb

## 2021-03-06 DIAGNOSIS — F320 Major depressive disorder, single episode, mild: Secondary | ICD-10-CM

## 2021-03-06 DIAGNOSIS — R2681 Unsteadiness on feet: Secondary | ICD-10-CM

## 2021-03-06 DIAGNOSIS — D509 Iron deficiency anemia, unspecified: Secondary | ICD-10-CM | POA: Diagnosis not present

## 2021-03-06 DIAGNOSIS — F411 Generalized anxiety disorder: Secondary | ICD-10-CM

## 2021-03-06 DIAGNOSIS — I10 Essential (primary) hypertension: Secondary | ICD-10-CM

## 2021-03-06 DIAGNOSIS — K219 Gastro-esophageal reflux disease without esophagitis: Secondary | ICD-10-CM | POA: Diagnosis not present

## 2021-03-06 DIAGNOSIS — Z96652 Presence of left artificial knee joint: Secondary | ICD-10-CM

## 2021-03-06 DIAGNOSIS — M8949 Other hypertrophic osteoarthropathy, multiple sites: Secondary | ICD-10-CM

## 2021-03-06 DIAGNOSIS — N3945 Continuous leakage: Secondary | ICD-10-CM

## 2021-03-06 DIAGNOSIS — Z853 Personal history of malignant neoplasm of breast: Secondary | ICD-10-CM

## 2021-03-06 DIAGNOSIS — R519 Headache, unspecified: Secondary | ICD-10-CM

## 2021-03-06 DIAGNOSIS — M159 Polyosteoarthritis, unspecified: Secondary | ICD-10-CM

## 2021-03-06 DIAGNOSIS — Z0000 Encounter for general adult medical examination without abnormal findings: Secondary | ICD-10-CM

## 2021-03-06 DIAGNOSIS — R413 Other amnesia: Secondary | ICD-10-CM

## 2021-03-06 MED ORDER — OMEPRAZOLE 40 MG PO CPDR: DELAYED_RELEASE_CAPSULE | ORAL | 1 refills | Status: DC

## 2021-03-06 NOTE — Progress Notes (Signed)
Provider: Marlowe Sax FNP-C   Tracy Faulkner, Nelda Bucks, NP  Patient Care Team: [place] Schnitker, Nelda Bucks, NP as PCP - General (Family Medicine) Belva Crome, MD as Consulting Physician (Cardiology)  Extended Emergency Contact Information Primary Emergency Contact: [place] of [place] Phone: [phone] Mobile Phone: [phone] Relation: Daughter  Code Status: Full Code  Goals of care: Advanced Directive information Advanced Directives 03/06/2021  Does Patient Have a Medical Advance Directive? No  Would patient like information on creating a medical advance directive? No - Patient declined     Chief Complaint  Patient presents with  . Establish Care    New Patient.     HPI:  Pt is a 87 y.o. female seen today to establish care at [facility] for medical management of chronic diseases.She has medical history of hypertension,hx of near syncope,GERD,Osteoarthritis,Osteoarthritis,right knee pain,Unsteady gait,Anemia,Hx of GI bleed,bladder Incontinence,dry eyes,memory loss,Generalized anxiety,lower extremities edema,Headaches, hx of breast cancer with right breast mastectomy,hx of PVCs  among other conditions.  Has lumpy nodes along both forearms and one recurrent soft tissues swelling  lump on posterior forearm.states has had surgery but returned.She was told there was a possibility for lump to recur.swelling does not hurt.  Has bilateral knee pain.Has used tylenol,OTC rubbing cream/Alcohol rubs and heating pad.sometimes feels like knees are popping and slipping.Has had right knee replacement.  No recent hospital admission or fall episode.appetite is good.Has a hx of Gastric Bypass states staples separated several years ago ended up with Gastric intestinal bleeding was evaluated in ED.since then has had no GI bleed.  She is widowed lives with her daughter,grandchildren and great grandchildren " we have 4 generation under one roof ".  She requires minimal  assistance with her bathing and dressing. Limited preparation of foods.Her medication and finance is managed by family. No difficulty affording her medication.   She does some limited exercises like stretching.  Daughter states lost a lot of weight after gastric bypass but now weight seems to be stable. No recent fall episode.    Past Medical History:  Diagnosis Date  . Anemia   . Arthritis   . Ataxia   . Back problem   . Bowel trouble   . Breast cancer ([place])   . Carpal tunnel syndrome   . Constipation   . Disturbance, sleep   . Dyspnea    increased exertion   . Frequent headaches   . GI bleed   . History of mammogram 2000  . History of urinary tract infection   . Hypertension   . Obesity   . Pain in both knees   . Sepsis ([place])   . Urinary incontinence    Past Surgical History:  Procedure Laterality Date  . BREAST SURGERY     for cancer-right breast  . COLON SURGERY     secondary to bowel blockages  . EYE SURGERY     cataract surgery bilateral   . gastric stapling    . KNEE ARTHROPLASTY Left 10/07/2016   Procedure: LEFT TOTAL KNEE ARTHROPLASTY WITH COMPUTER NAVIGATION;  Surgeon: Rod Can, MD;  Location: [facility];  Service: Orthopedics;  Laterality: Left;    Allergies  Allergen Reactions  . Ace Inhibitors Cough  . Lortab [Hydrocodone-Acetaminophen]     Bad dreams  . Remeron [Mirtazapine] Other (See Comments)    Hallucinations (can take 1/2 tablet--7.5 mg), not 15 mg  . Penicillins Rash and Other (See Comments)    Throat swelling, Has patient had a PCN reaction causing  immediate rash, facial/tongue/throat swelling, SOB or lightheadedness with hypotension: Yes Has patient had a PCN reaction causing severe rash involving mucus membranes or skin necrosis: No Has patient had a PCN reaction that required hospitalization No Has patient had a PCN reaction occurring within the last 10 years: No If all of the above answers are "NO", then may proceed with Cephalosporin  use.     Allergies as of 03/06/2021      Reactions   Ace Inhibitors Cough   Lortab [hydrocodone-acetaminophen]    Bad dreams   Remeron [mirtazapine] Other (See Comments)   Hallucinations (can take 1/2 tablet--7.5 mg), not 15 mg   Penicillins Rash, Other (See Comments)   Throat swelling, Has patient had a PCN reaction causing immediate rash, facial/tongue/throat swelling, SOB or lightheadedness with hypotension: Yes Has patient had a PCN reaction causing severe rash involving mucus membranes or skin necrosis: No Has patient had a PCN reaction that required hospitalization No Has patient had a PCN reaction occurring within the last 10 years: No If all of the above answers are "NO", then may proceed with Cephalosporin use.      Medication List       Accurate as of March 06, 2021  2:25 PM. If you have any questions, ask your nurse or doctor.        STOP taking these medications   ciclopirox 8 % solution Commonly known as: Penlac Stopped by: Dicky Doe, CMA   docusate sodium 100 MG capsule Commonly known as: COLACE Stopped by: Jed Limerick Dillard, CMA   sucralfate 1 g tablet Commonly known as: CARAFATE Stopped by: Dicky Doe, CMA   Vitamin D3 250 MCG (10000 UT) Tabs Stopped by: Dicky Doe, CMA     TAKE these medications   acetaminophen 650 MG CR tablet Commonly known as: TYLENOL Take 650 mg by mouth daily as needed for pain.   Butalbital-Acetaminophen 25-325 MG Tabs Take 1 tablet by mouth as needed.   CALCIUM PO Take 1 capsule by mouth daily.   cycloSPORINE 0.05 % ophthalmic emulsion Commonly known as: RESTASIS Place 1 drop into both eyes as needed. What changed: Another medication with the same name was removed. Continue taking this medication, and follow the directions you see here. Changed by: Dicky Doe, CMA   FISH OIL PO Take 1 capsule by mouth daily.   losartan 50 MG tablet Commonly known as: COZAAR Take 50 mg by mouth daily.    multivitamin tablet Take 1 tablet by mouth daily.   omeprazole 40 MG capsule Commonly known as: PRILOSEC TAKE 1 CAPSULE BY MOUTH ONCE DAILY BEFORE BREAKFAST   OVER THE COUNTER MEDICATION Take 1 capsule by mouth daily. Gluco Gel Joint & Cartilage Support       Review of Systems  Constitutional: Negative for activity change, appetite change, chills, fatigue and fever.  HENT: Negative for congestion, hearing loss, rhinorrhea, sinus pressure, sinus pain, sore throat and trouble swallowing.        Dentures -Partial plate   Eyes: Positive for visual disturbance. Negative for discharge, redness and itching.       Dry eyes  Cataract surgery  Respiratory: Negative for cough, chest tightness, shortness of breath and wheezing.   Cardiovascular: Negative for chest pain, palpitations and leg swelling.  Gastrointestinal: Positive for constipation. Negative for abdominal distention, abdominal pain, diarrhea, nausea and vomiting.  Endocrine: Negative for cold intolerance, heat intolerance, polydipsia, polyphagia and polyuria.  Genitourinary: Negative for difficulty urinating, dysuria, flank  pain, frequency and urgency.  Musculoskeletal: Positive for arthralgias and gait problem.       Knee pain  Hx right knee surgery   Skin: Negative for color change, pallor and rash.       Lumps on both arms   Neurological: Negative for dizziness, speech difficulty, weakness, light-headedness, numbness and headaches.  Hematological: Does not bruise/bleed easily.  Psychiatric/Behavioral: Negative for agitation, behavioral problems, confusion and sleep disturbance. The patient is not nervous/anxious.        Forgetful sometimes Depressed sometimes  Anxiety     Immunization History  Administered Date(s) Administered  . Influenza, High Dose Seasonal PF 08/13/2019   Pertinent  Health Maintenance Due  Topic Date Due  . DEXA SCAN  Never done  . PNA vac Low Risk Adult (1 of 2 - PCV13) Never done  . INFLUENZA  VACCINE  06/29/2021   Fall Risk  03/06/2021 11/15/2016 11/13/2016  Falls in the past year? 1 No Yes  Number falls in past yr: 0 - 2 or more  Injury with Fall? 0 - No  Comment - - just had some bruises.   Functional Status Survey:    Vitals:   03/06/21 1340  BP: 128/70  Pulse: 67  Resp: 16  Temp: (!) 97.5 F (36.4 C)  SpO2: 98%  Weight: 176 lb 3.2 oz (79.9 kg)  Height: 5' 6.5" (1.689 m)   Body mass index is 28.01 kg/m. Physical Exam Vitals reviewed.  Constitutional:      General: She is not in acute distress.    Appearance: She is overweight. She is not ill-appearing.  HENT:     Head: Normocephalic.     Right Ear: Tympanic membrane, ear canal and external ear normal. There is no impacted cerumen.     Left Ear: Tympanic membrane, ear canal and external ear normal. There is no impacted cerumen.     Nose: Nose normal. No congestion or rhinorrhea.     Mouth/Throat:     Mouth: Mucous membranes are moist.     Pharynx: Oropharynx is clear. No oropharyngeal exudate or posterior oropharyngeal erythema.  Eyes:     General: No scleral icterus.       Right eye: No discharge.        Left eye: No discharge.     Extraocular Movements: Extraocular movements intact.     Conjunctiva/sclera: Conjunctivae normal.     Pupils: Pupils are equal, round, and reactive to light.  Neck:     Vascular: No carotid bruit.  Cardiovascular:     Rate and Rhythm: Normal rate and regular rhythm.     Pulses: Normal pulses.     Heart sounds: Normal heart sounds. No murmur heard. No friction rub. No gallop.   Pulmonary:     Effort: Pulmonary effort is normal. No respiratory distress.     Breath sounds: Normal breath sounds. No wheezing, rhonchi or rales.  Abdominal:     General: Bowel sounds are normal. There is no distension.     Palpations: Abdomen is soft. There is no mass.     Tenderness: There is no abdominal tenderness. There is no right CVA tenderness, left CVA tenderness, guarding or rebound.   Musculoskeletal:        General: No swelling or tenderness.     Cervical back: Normal range of motion. No rigidity or tenderness.     Comments: Unsteady gait on wheelchair during visit.bilateral lower extremities trace -1+ edema   Lymphadenopathy:  Cervical: No cervical adenopathy.  Skin:    General: Skin is warm and dry.     Coloration: Skin is not pale.     Findings: No bruising, erythema or rash.     Comments: Right breast old surgical incision scar noted.  Neurological:     Mental Status: She is alert. Mental status is at baseline.     Cranial Nerves: No cranial nerve deficit.     Sensory: No sensory deficit.     Motor: No weakness.     Coordination: Coordination normal.     Gait: Gait abnormal.  Psychiatric:        Mood and Affect: Mood normal.        Speech: Speech normal.        Behavior: Behavior normal.        Thought Content: Thought content normal.        Cognition and Memory: Memory is impaired.     Labs reviewed: Recent Labs    01/29/21 1625  NA 141  K 4.4  CL 108*  CO2 21  GLUCOSE 119*  BUN 22  CREATININE 1.18*  CALCIUM 9.3   Recent Labs    01/29/21 1625  AST 18  ALT 14  ALKPHOS 65  BILITOT 0.4  PROT 6.6  ALBUMIN 3.8   Recent Labs    01/29/21 1625  WBC 5.5  HGB 10.7*  HCT 33.7*  MCV 81  PLT 194   Lab Results  Component Value Date   TSH 1.200 01/29/2021   No results found for: HGBA1C No results found for: CHOL, HDL, LDLCALC, LDLDIRECT, TRIG, CHOLHDL  Significant Diagnostic Results in last 30 days:  ECHOCARDIOGRAM COMPLETE  Result Date: 02/27/2021    ECHOCARDIOGRAM REPORT   Patient Name:   JIZEL CHEEKS Date of Exam: 02/27/2021 Medical Rec #:  [id number]      Height:       66.5 in Accession #:    [id number]     Weight:       176.2 lb Date of Birth:  [birth year]      BSA:          1.906 m Patient Age:    71 years       BP:           128/74 mmHg Patient Gender: F              HR:           64 bpm. Exam Location:  [place] Procedure:  2D Echo, Cardiac Doppler and Color Doppler Indications:    R06.00 Dyspnea  History:        Patient has prior history of Echocardiogram examinations, most                 recent 09/15/2015. Risk Factors:Hypertension.  Sonographer:    Cresenciano Lick RDCS Referring Phys: [id number] PAULA V ROSS IMPRESSIONS  1. Left ventricular ejection fraction, by estimation, is 60 to 65%. The left ventricle has normal function. The left ventricle has no regional wall motion abnormalities. There is mild left ventricular hypertrophy. Left ventricular diastolic parameters are consistent with Grade I diastolic dysfunction (impaired relaxation). Elevated left atrial pressure.  2. Right ventricular systolic function is normal. The right ventricular size is normal.  3. Left atrial size was mildly dilated.  4. The mitral valve is normal in structure. Trivial mitral valve regurgitation. No evidence of mitral stenosis.  5. The aortic valve is tricuspid. Aortic valve regurgitation is not  visualized. Mild to moderate aortic valve sclerosis/calcification is present, without any evidence of aortic stenosis.  6. The inferior vena cava is normal in size with greater than 50% respiratory variability, suggesting right atrial pressure of 3 mmHg. FINDINGS  Left Ventricle: Left ventricular ejection fraction, by estimation, is 60 to 65%. The left ventricle has normal function. The left ventricle has no regional wall motion abnormalities. The left ventricular internal cavity size was normal in size. There is  mild left ventricular hypertrophy. Left ventricular diastolic parameters are consistent with Grade I diastolic dysfunction (impaired relaxation). Elevated left atrial pressure. Right Ventricle: The right ventricular size is normal. Right ventricular systolic function is normal. Left Atrium: Left atrial size was mildly dilated. Right Atrium: Right atrial size was normal in size. Pericardium: There is no evidence of pericardial effusion. Mitral Valve:  The mitral valve is normal in structure. Trivial mitral valve regurgitation. No evidence of mitral valve stenosis. Tricuspid Valve: The tricuspid valve is normal in structure. Tricuspid valve regurgitation is mild . No evidence of tricuspid stenosis. Aortic Valve: The aortic valve is tricuspid. Aortic valve regurgitation is not visualized. Mild to moderate aortic valve sclerosis/calcification is present, without any evidence of aortic stenosis. Pulmonic Valve: The pulmonic valve was normal in structure. Pulmonic valve regurgitation is not visualized. No evidence of pulmonic stenosis. Aorta: The aortic root is normal in size and structure. Venous: The inferior vena cava is normal in size with greater than 50% respiratory variability, suggesting right atrial pressure of 3 mmHg. IAS/Shunts: The interatrial septum is aneurysmal. No atrial level shunt detected by color flow Doppler.  LEFT VENTRICLE PLAX 2D LVIDd:         3.80 cm  Diastology LVIDs:         2.30 cm  LV e' medial:    5.00 cm/s LV PW:         1.20 cm  LV E/e' medial:  19.6 LV IVS:        1.20 cm  LV e' lateral:   9.03 cm/s LVOT diam:     2.10 cm  LV E/e' lateral: 10.9 LV SV:         90 LV SV Index:   47 LVOT Area:     3.46 cm  RIGHT VENTRICLE RV Basal diam:  4.20 cm RV S prime:     11.25 cm/s TAPSE (M-mode): 2.0 cm LEFT ATRIUM             Index       RIGHT ATRIUM           Index LA diam:        4.20 cm 2.20 cm/m  RA Area:     14.80 cm LA Vol (A2C):   45.5 ml 23.88 ml/m RA Volume:   38.70 ml  20.31 ml/m LA Vol (A4C):   63.2 ml 33.17 ml/m LA Biplane Vol: 54.7 ml 28.70 ml/m  AORTIC VALVE LVOT Vmax:   117.00 cm/s LVOT Vmean:  87.700 cm/s LVOT VTI:    0.260 m  AORTA Ao Root diam: 3.30 cm Ao Asc diam:  3.70 cm MITRAL VALVE               TRICUSPID VALVE MV Area (PHT): 3.99 cm    TR Peak grad:   23.6 mmHg MV Decel Time: 190 msec    TR Vmax:        243.00 cm/s MV E velocity: 98.00 cm/s MV A velocity: 96.50 cm/s  SHUNTS MV E/A ratio:  1.02        Systemic VTI:   0.26 m                            Systemic Diam: 2.10 cm Kirk Ruths MD Electronically signed by Kirk Ruths MD Signature Date/Time: 02/27/2021/3:49:22 PM    Final     Assessment/Plan  1. Essential hypertension B/p well controlled. - continue on losartan 50 mg tablet daily. - CBC with Differential/Platelet; Future - CMP with eGFR([facility]); Future - TSH; Future  2. Iron deficiency anemia, unspecified iron deficiency anemia type Latest Hgb 10.7 (01/29/2021) wil recheck CBC. - CBC with Differential/Platelet; Future  3. Gastroesophageal reflux disease without esophagitis Symptoms under control on Omeprazole.request script refill.  Symptoms controlled. .No tarry or black stool  - advised to avoid eating meals late in the evening and to avoid aggravating foods and spices. - continue on Omeprazole  - omeprazole (PRILOSEC) 40 MG capsule; TAKE 1 CAPSULE BY MOUTH ONCE DAILY BEFORE BREAKFAST  Dispense: 90 capsule; Refill: 1  4. Encounter for medical examination to establish care  immunization reviewed due for COVID-19,TDap and PNA but thinks has had immunization will wait for medical records from previous PCP then update. Medication and labs reviewed patient counselled regarding diet, regular sustained exercise for at least 30 minutes x 3 /week as tolerated.  5. Continuous leakage of urine Continue to wear incontinent pull ups and change frequently if soiled to prevent skin breakdown.   6. Unsteady gait Continue fall and safety precaution.   7. Memory loss Encouraged to perform activities that stimulant brain such as puzzles,word search or cross words. - continue with family supportive care.   8. Current mild episode of major depressive disorder, unspecified whether recurrent ([place]) Mild symptoms no medication desired this visit  - continue to monitor for mood swings will start on sertraline 25 mg tablet daily if symptoms worsen.   9. Generalized anxiety disorder Intermittent.will  benefit on sertraline but declines any medication for now.  Continue to monitor.   10. Primary osteoarthritis involving multiple joints Continue on tylenol   11. Hx of breast cancer Old surgical scar intact.   12. History of total left knee replacement Bilateral knee pain.surgical scar intact. - continue on tylenol   13. Frequent headaches Intermittent.tylenol effective  - encouraged to increase water intake to 6-8 glasses daily.   Family/ staff Communication: Reviewed plan of care with patient and daughter   Labs/tests ordered:  - CBC with Differential/Platelet; Future - CMP with eGFR([facility]); Future - TSH; Future  Next Appointment : 6 months for medical management of chronic issues.Fasting Labs prior to visit.   Time spent with patient 60 minutes >50% time spent counseling; reviewing medical record; tests; labs; and developing future plan of care   Sandrea Hughs, NP

## 2021-03-06 NOTE — Patient Instructions (Signed)
-   Increase water intake to 6-8 glasses daily

## 2021-03-25 ENCOUNTER — Ambulatory Visit: Payer: Medicare PPO | Admitting: Podiatry

## 2021-03-25 ENCOUNTER — Other Ambulatory Visit: Payer: Self-pay

## 2021-03-25 DIAGNOSIS — M79675 Pain in left toe(s): Secondary | ICD-10-CM | POA: Diagnosis not present

## 2021-03-25 DIAGNOSIS — M79674 Pain in right toe(s): Secondary | ICD-10-CM | POA: Diagnosis not present

## 2021-03-25 DIAGNOSIS — B351 Tinea unguium: Secondary | ICD-10-CM | POA: Diagnosis not present

## 2021-03-25 DIAGNOSIS — M7751 Other enthesopathy of right foot: Secondary | ICD-10-CM

## 2021-03-27 ENCOUNTER — Encounter: Payer: Self-pay | Admitting: Podiatry

## 2021-03-27 NOTE — Progress Notes (Signed)
  Subjective:  Patient ID: Tracy Faulkner, female    DOB: 1932-03-31,  MRN: [id number]  Chief Complaint  Patient presents with  . Nail Problem    Thick painful toenails   87 y.o. female returns for the above complaint.  Patient presents with thickened elongated dystrophic toenails x10.  Patient would like to have them debrided down she is not able to do it herself.  She states that there is pain with ambulation.  She also presents with right first metatarsophalangeal joint pain.  She states hurts with ambulation.  She has not treated for it she has not gotten injection.  Objective:  There were no vitals filed for this visit. Podiatric Exam: Vascular: dorsalis pedis and posterior tibial pulses are palpable bilateral. Capillary return is immediate. Temperature gradient is WNL. Skin turgor WNL  Sensorium: Normal Semmes Weinstein monofilament test. Normal tactile sensation bilaterally. Nail Exam: Pt has thick disfigured discolored nails with subungual debris noted bilateral entire nail hallux through fifth toenails.  Pain on palpation to the nails. Ulcer Exam: There is no evidence of ulcer or pre-ulcerative changes or infection. Orthopedic Exam: Muscle tone and strength are WNL. No limitations in general ROM. No crepitus or effusions noted. HAV  B/L.  Hammer toes 2-5  B/L.  Pain with range of motion of the first MPJ.  Mild intra-articular pain noted.  Pain with end range of motion of the first MPJ. Skin: No Porokeratosis. No infection or ulcers    Assessment & Plan:   1. Capsulitis of metatarsophalangeal (MTP) joint of right foot   2. Pain due to onychomycosis of toenails of both feet     Patient was evaluated and treated and all questions answered.  Right first MTP capsulitis -I explained the patient the etiology of capsulitis versus treatment options were discussed.  Given the amount of pain that it she is having I believe she will benefit from steroid injection help decrease acute  Inflammatory component associated pain.  Patient states understanding would like to proceed with that. -A steroid injection was performed at right first MPJ using 1% plain Lidocaine and 10 mg of Kenalog. This was well tolerated.   Onychomycosis with pain  -Nails palliatively debrided as below. -Educated on self-care  Procedure: Nail Debridement Rationale: pain  Type of Debridement: manual, sharp debridement. Instrumentation: Nail nipper, rotary burr. Number of Nails: 10  Procedures and Treatment: Consent by patient was obtained for treatment procedures. The patient understood the discussion of treatment and procedures well. All questions were answered thoroughly reviewed. Debridement of mycotic and hypertrophic toenails, 1 through 5 bilateral and clearing of subungual debris. No ulceration, no infection noted.  Return Visit-Office Procedure: Patient instructed to return to the office for a follow up visit 3 months for continued evaluation and treatment.  Boneta Lucks, DPM    Return in about 3 months (around 06/24/2021) for nail trim.

## 2021-04-23 ENCOUNTER — Other Ambulatory Visit: Payer: Self-pay

## 2021-04-23 ENCOUNTER — Ambulatory Visit: Payer: Medicare PPO | Admitting: Adult Health

## 2021-04-23 ENCOUNTER — Encounter: Payer: Self-pay | Admitting: Adult Health

## 2021-04-23 VITALS — BP 120/78 | HR 67 | Temp 97.7°F | Resp 16 | Ht 66.5 in | Wt 172.2 lb

## 2021-04-23 DIAGNOSIS — N3000 Acute cystitis without hematuria: Secondary | ICD-10-CM

## 2021-04-23 DIAGNOSIS — M8949 Other hypertrophic osteoarthropathy, multiple sites: Secondary | ICD-10-CM

## 2021-04-23 DIAGNOSIS — R8290 Unspecified abnormal findings in urine: Secondary | ICD-10-CM

## 2021-04-23 DIAGNOSIS — R600 Localized edema: Secondary | ICD-10-CM | POA: Diagnosis not present

## 2021-04-23 DIAGNOSIS — M159 Polyosteoarthritis, unspecified: Secondary | ICD-10-CM

## 2021-04-23 LAB — POCT URINALYSIS DIPSTICK
Blood, UA: NEGATIVE
Glucose, UA: NEGATIVE
Nitrite, UA: POSITIVE
Protein, UA: POSITIVE — AB
Spec Grav, UA: >=1.03 — AB (ref 1.010–1.025)
Urobilinogen, UA: >=8 E.U./dL — AB
pH, UA: 5 (ref 5.0–8.0)

## 2021-04-23 MED ORDER — SACCHAROMYCES BOULARDII 250 MG PO CAPS: 250.0000 mg | ORAL_CAPSULE | Freq: Two times a day (BID) | ORAL | 0 refills | Status: AC

## 2021-04-23 MED ORDER — CIPROFLOXACIN HCL 500 MG PO TABS: 500.0000 mg | ORAL_TABLET | Freq: Two times a day (BID) | ORAL | 0 refills | Status: AC

## 2021-04-23 NOTE — Progress Notes (Addendum)
[facility]  Provider:   Durenda Age DNP  Code Status:  Full Code  Goals of Care:  Advanced Directives 04/23/2021  Does Patient Have a Medical Advance Directive? No  Would patient like information on creating a medical advance directive? No - Patient declined     Chief Complaint  Patient presents with  . Acute Visit    Complains of pain in right hip with difficulty getting up/down.    HPI: Patient is a 87 y.o. female seen today for an acute visit for pain on her right hip, right knee, right lower back. She denies any falls nor trauma. The pain started yesterday and she needed to have a walker to help her ambulate. She had left knee replacement years ago. Daughter gave her Tylenol last night. This morning, the pain is better and and back to her baseline. Daughter accompanied patient to the clinic. Patient complained of right lower back pain. Daughter said that patient's urine has a strong smell and has mucus. Daughter said that she drinks soda, lemonade and water. She has a PMH of hypertension, arrhythmia and GERD.     Past Medical History:  Diagnosis Date  . Anemia   . Arthritis   . Ataxia   . Back problem   . Bowel trouble   . Breast cancer ([place])   . Carpal tunnel syndrome   . Constipation   . Disturbance, sleep   . Dyspnea    increased exertion   . Frequent headaches   . GI bleed   . History of mammogram 2000  . History of urinary tract infection   . Hypertension   . Obesity   . Pain in both knees   . Sepsis ([place])   . Urinary incontinence     Past Surgical History:  Procedure Laterality Date  . BREAST SURGERY     for cancer-right breast  . COLON SURGERY     secondary to bowel blockages  . EYE SURGERY     cataract surgery bilateral   . gastric stapling    . KNEE ARTHROPLASTY Left 10/07/2016   Procedure: LEFT TOTAL KNEE ARTHROPLASTY WITH COMPUTER NAVIGATION;  Surgeon: Rod Can, MD;  Location: [facility];  Service: Orthopedics;  Laterality: Left;     Allergies  Allergen Reactions  . Ace Inhibitors Cough  . Lortab [Hydrocodone-Acetaminophen]     Bad dreams  . Remeron [Mirtazapine] Other (See Comments)    Hallucinations (can take 1/2 tablet--7.5 mg), not 15 mg  . Penicillins Rash and Other (See Comments)    Throat swelling, Has patient had a PCN reaction causing immediate rash, facial/tongue/throat swelling, SOB or lightheadedness with hypotension: Yes Has patient had a PCN reaction causing severe rash involving mucus membranes or skin necrosis: No Has patient had a PCN reaction that required hospitalization No Has patient had a PCN reaction occurring within the last 10 years: No If all of the above answers are "NO", then may proceed with Cephalosporin use.     Outpatient Encounter Medications as of 04/23/2021  Medication Sig  . acetaminophen (TYLENOL) 650 MG CR tablet Take 650 mg by mouth daily as needed for pain.  . Butalbital-Acetaminophen 25-325 MG TABS Take 1 tablet by mouth as needed.  Marland Kitchen CALCIUM PO Take 1 capsule by mouth daily.  . cycloSPORINE (RESTASIS) 0.05 % ophthalmic emulsion Place 1 drop into both eyes as needed.  Marland Kitchen losartan (COZAAR) 50 MG tablet Take 50 mg by mouth daily.  . Multiple Vitamin (MULTIVITAMIN) tablet Take 1  tablet by mouth daily.  . Omega-3 Fatty Acids (FISH OIL PO) Take 1 capsule by mouth daily.  Marland Kitchen omeprazole (PRILOSEC) 40 MG capsule TAKE 1 CAPSULE BY MOUTH ONCE DAILY BEFORE BREAKFAST  . OVER THE COUNTER MEDICATION Take 1 capsule by mouth daily. Gluco Gel Joint & Cartilage Support   No facility-administered encounter medications on file as of 04/23/2021.    Review of Systems:  Review of Systems  Constitutional: Positive for activity change. Negative for appetite change, chills and fever.  HENT: Negative for congestion and sore throat.   Eyes: Negative for discharge and itching.  Respiratory: Negative for cough and shortness of breath.   Gastrointestinal: Negative for abdominal pain, nausea and  vomiting.  Genitourinary: Positive for dysuria.  Musculoskeletal: Positive for arthralgias, back pain, gait problem and joint swelling.  Skin: Negative.  Negative for rash.    Health Maintenance  Topic Date Due  . COVID-19 Vaccine (1) Never done  . TETANUS/TDAP  Never done  . Zoster Vaccines- Shingrix (1 of 2) Never done  . DEXA SCAN  Never done  . PNA vac Low Risk Adult (1 of 2 - PCV13) Never done  . INFLUENZA VACCINE  06/29/2021  . HPV VACCINES  Aged Out    Physical Exam: Vitals:   04/23/21 1506  Height: 5' 6.5" (1.689 m)   Body mass index is 28.01 kg/m. Physical Exam Constitutional:      Appearance: Normal appearance.  HENT:     Head: Normocephalic and atraumatic.     Nose: Nose normal.  Eyes:     Conjunctiva/sclera: Conjunctivae normal.  Cardiovascular:     Rate and Rhythm: Rhythm irregular.     Pulses: Normal pulses.  Pulmonary:     Effort: Pulmonary effort is normal.     Breath sounds: Normal breath sounds. No rales.  Chest:     Chest wall: No tenderness.  Abdominal:     General: Bowel sounds are normal.     Palpations: Abdomen is soft.  Musculoskeletal:     Cervical back: Normal range of motion and neck supple.     Right lower leg: Edema present.     Left lower leg: Edema present.     Comments: BLE 1+edema   Skin:    General: Skin is warm and dry.  Neurological:     General: No focal deficit present.     Mental Status: She is alert and oriented to person, place, and time. Mental status is at baseline.  Psychiatric:        Mood and Affect: Mood normal.        Behavior: Behavior normal.     Labs reviewed: Basic Metabolic Panel: Recent Labs    01/29/21 1625  NA 141  K 4.4  CL 108*  CO2 21  GLUCOSE 119*  BUN 22  CREATININE 1.18*  CALCIUM 9.3  TSH 1.200   Liver Function Tests: Recent Labs    01/29/21 1625  AST 18  ALT 14  ALKPHOS 65  BILITOT 0.4  PROT 6.6  ALBUMIN 3.8   CBC: Recent Labs    01/29/21 1625  WBC 5.5  HGB 10.7*   HCT 33.7*  MCV 81  PLT 194     Assessment/Plan   1. Abnormal urine odor       Acute cystitis without hematuria - POC Urinalysis Dipstick  Which showed cloudy urine, positive nitrate and large leukocyte -  Instructed to drink water instead of soda or lemonade - Urine Culture  -  ciprofloxacin (CIPRO) 500 MG tablet; Take 1 tablet (500 mg total) by mouth 2 (two) times daily for 5 days.  Dispense: 10 tablet; Refill: 0 - saccharomyces boulardii (FLORASTOR) 250 MG capsule; Take 1 capsule (250 mg total) by mouth 2 (two) times daily for 8 days.  Dispense: 16 capsule; Refill: 0  2. Primary osteoarthritis involving multiple joints -  Continue PRN Tylenol -   Discussed with daughter that she can use over the counter Asprecreme patch PRN for the pain  3. Edema of both lower extremities -  Discussed compression stockings with daughter -   Daughter stated that she will buy compression stockings at the medical supply store -   Elevate BLE at night      Labs/tests ordered: Urine dipstick and urine culture  Next appt:  09/04/2021

## 2021-04-23 NOTE — Patient Instructions (Addendum)
Urinary Tract Infection, Adult A urinary tract infection (UTI) is an infection of any part of the urinary tract. The urinary tract includes:  The kidneys.  The ureters.  The bladder.  The urethra. These organs make, store, and get rid of pee (urine) in the body. What are the causes? This infection is caused by germs (bacteria) in your genital area. These germs grow and cause swelling (inflammation) of your urinary tract. What increases the risk? The following factors may make you more likely to develop this condition:  Using a small, thin tube (catheter) to drain pee.  Not being able to control when you pee or poop (incontinence).  Being female. If you are female, these things can increase the risk: ? Using these methods to prevent pregnancy:  A medicine that kills sperm (spermicide).  A device that blocks sperm (diaphragm). ? Having low levels of a female hormone (estrogen). ? Being pregnant. You are more likely to develop this condition if:  You have genes that add to your risk.  You are sexually active.  You take antibiotic medicines.  You have trouble peeing because of: ? A prostate that is bigger than normal, if you are female. ? A blockage in the part of your body that drains pee from the bladder. ? A kidney stone. ? A nerve condition that affects your bladder. ? Not getting enough to drink. ? Not peeing often enough.  You have other conditions, such as: ? Diabetes. ? A weak disease-fighting system (immune system). ? Sickle cell disease. ? Gout. ? Injury of the spine. What are the signs or symptoms? Symptoms of this condition include:  Needing to pee right away.  Peeing small amounts often.  Pain or burning when peeing.  Blood in the pee.  Pee that smells bad or not like normal.  Trouble peeing.  Pee that is cloudy.  Fluid coming from the vagina, if you are female.  Pain in the belly or lower back. Other symptoms include:  Vomiting.  Not  feeling hungry.  Feeling mixed up (confused). This may be the first symptom in older adults.  Being tired and grouchy (irritable).  A fever.  Watery poop (diarrhea). How is this treated?  Taking antibiotic medicine.  Taking other medicines.  Drinking enough water. In some cases, you may need to see a specialist. Follow these instructions at home: Medicines  Take over-the-counter and prescription medicines only as told by your doctor.  If you were prescribed an antibiotic medicine, take it as told by your doctor. Do not stop taking it even if you start to feel better. General instructions  Make sure you: ? Pee until your bladder is empty. ? Do not hold pee for a long time. ? Empty your bladder after sex. ? Wipe from front to back after peeing or pooping if you are a female. Use each tissue one time when you wipe.  Drink enough fluid to keep your pee pale yellow.  Keep all follow-up visits.   Contact a doctor if:  You do not get better after 1-2 days.  Your symptoms go away and then come back. Get help right away if:  You have very bad back pain.  You have very bad pain in your lower belly.  You have a fever.  You have chills.  You feeling like you will vomit or you vomit. Summary  A urinary tract infection (UTI) is an infection of any part of the urinary tract.  This condition is caused by   germs in your genital area.  There are many risk factors for a UTI.  Treatment includes antibiotic medicines.  Drink enough fluid to keep your pee pale yellow. This information is not intended to replace advice given to you by your health care provider. Make sure you discuss any questions you have with your health care provider. Document Revised: 06/27/2020 Document Reviewed: 06/27/2020 Elsevier Patient Education  2021 [place].  Osteoarthritis  Osteoarthritis is a type of arthritis. It refers to joint pain or joint disease. Osteoarthritis affects tissue that  covers the ends of bones in joints (cartilage). Cartilage acts as a cushion between the bones and helps them move smoothly. Osteoarthritis occurs when cartilage in the joints gets worn down. Osteoarthritis is sometimes called "wear and tear" arthritis. Osteoarthritis is the most common form of arthritis. It often occurs in older people. It is a condition that gets worse over time. The joints most often affected by this condition are in the fingers, toes, hips, knees, and spine, including the neck and lower back. What are the causes? This condition is caused by the wearing down of cartilage that covers the ends of bones. What increases the risk? The following factors may make you more likely to develop this condition:  Being age 85 or older.  Obesity.  Overuse of joints.  Past injury of a joint.  Past surgery on a joint.  Family history of osteoarthritis. What are the signs or symptoms? The main symptoms of this condition are pain, swelling, and stiffness in the joint. Other symptoms may include:  An enlarged joint.  More pain and further damage caused by small pieces of bone or cartilage that break off and float inside of the joint.  Small deposits of bone (osteophytes) that grow on the edges of the joint.  A grating or scraping feeling inside the joint when you move it.  Popping or creaking sounds when you move.  Difficulty walking or exercising.  An inability to grip items, twist your hand(s), or control the movements of your hands and fingers. How is this diagnosed? This condition may be diagnosed based on:  Your medical history.  A physical exam.  Your symptoms.  X-rays of the affected joint(s).  Blood tests to rule out other types of arthritis. How is this treated? There is no cure for this condition, but treatment can help control pain and improve joint function. Treatment may include a combination of therapies, such as:  Pain relief techniques, such  as: ? Applying heat and cold to the joint. ? Massage. ? A form of talk therapy called cognitive behavioral therapy (CBT). This therapy helps you set goals and follow up on the changes that you make.  Medicines for pain and inflammation. The medicines can be taken by mouth or applied to the skin. They include: ? NSAIDs, such as ibuprofen. ? Prescription medicines. ? Strong anti-inflammatory medicines (corticosteroids). ? Certain nutritional supplements.  A prescribed exercise program. You may work with a physical therapist.  Assistive devices, such as a brace, wrap, splint, specialized glove, or cane.  A weight control plan.  Surgery, such as: ? An osteotomy. This is done to reposition the bones and relieve pain or to remove loose pieces of bone and cartilage. ? Joint replacement surgery. You may need this surgery if you have advanced osteoarthritis. Follow these instructions at home: Activity  Rest your affected joints as told by your health care provider.  Exercise as told by your health care provider. He or she  may recommend specific types of exercise, such as: ? Strengthening exercises. These are done to strengthen the muscles that support joints affected by arthritis. ? Aerobic activities. These are exercises, such as brisk walking or water aerobics, that increase your heart rate. ? Range-of-motion activities. These help your joints move more easily. ? Balance and agility exercises. Managing pain, stiffness, and swelling  If directed, apply heat to the affected area as often as told by your health care provider. Use the heat source that your health care provider recommends, such as a moist heat pack or a heating pad. ? If you have a removable assistive device, remove it as told by your health care provider. ? Place a towel between your skin and the heat source. If your health care provider tells you to keep the assistive device on while you apply heat, place a towel between the  assistive device and the heat source. ? Leave the heat on for 20-30 minutes. ? Remove the heat if your skin turns bright red. This is especially important if you are unable to feel pain, heat, or cold. You may have a greater risk of getting burned.  If directed, put ice on the affected area. To do this: ? If you have a removable assistive device, remove it as told by your health care provider. ? Put ice in a plastic bag. ? Place a towel between your skin and the bag. If your health care provider tells you to keep the assistive device on during icing, place a towel between the assistive device and the bag. ? Leave the ice on for 20 minutes, 2-3 times a day. ? Move your fingers or toes often to reduce stiffness and swelling. ? Raise (elevate) the injured area above the level of your heart while you are sitting or lying down.      General instructions  Take over-the-counter and prescription medicines only as told by your health care provider.  Maintain a healthy weight. Follow instructions from your health care provider for weight control.  Do not use any products that contain nicotine or tobacco, such as cigarettes, e-cigarettes, and chewing tobacco. If you need help quitting, ask your health care provider.  Use assistive devices as told by your health care provider.  Keep all follow-up visits as told by your health care provider. This is important. Where to find more information  [employer] of Arthritis and Musculoskeletal and Skin Diseases: www.niams.SouthExposed.es  [employer] on Aging: http://kim-miller.com/  American College of Rheumatology: www.rheumatology.org Contact a health care provider if:  You have redness, swelling, or a feeling of warmth in a joint that gets worse.  You have a fever along with joint or muscle aches.  You develop a rash.  You have trouble doing your normal activities. Get help right away if:  You have pain that gets worse and is not relieved by  pain medicine. Summary  Osteoarthritis is a type of arthritis that affects tissue covering the ends of bones in joints (cartilage).  This condition is caused by the wearing down of cartilage that covers the ends of bones.  The main symptom of this condition is pain, swelling, and stiffness in the joint.  There is no cure for this condition, but treatment can help control pain and improve joint function. This information is not intended to replace advice given to you by your health care provider. Make sure you discuss any questions you have with your health care provider. Document Revised: 11/12/2019 Document Reviewed: 11/12/2019  Elsevier Patient Education  2021 [place].   Peripheral Edema  Peripheral edema is swelling that is caused by a buildup of fluid. Peripheral edema most often affects the lower legs, ankles, and feet. It can also develop in the arms, hands, and face. The area of the body that has peripheral edema will look swollen. It may also feel heavy or warm. Your clothes may start to feel tight. Pressing on the area may make a temporary dent in your skin. You may not be able to move your swollen arm or leg as much as usual. There are many causes of peripheral edema. It can happen because of a complication of other conditions such as congestive heart failure, kidney disease, or a problem with your blood circulation. It also can be a side effect of certain medicines or because of an infection. It often happens to women during pregnancy. Sometimes, the cause is not known. Follow these instructions at home: Managing pain, stiffness, and swelling  Raise (elevate) your legs while you are sitting or lying down.  Move around often to prevent stiffness and to lessen swelling.  Do not sit or stand for long periods of time.  Wear support stockings as told by your health care provider.   Medicines  Take over-the-counter and prescription medicines only as told by your health care  provider.  Your health care provider may prescribe medicine to help your body get rid of excess water (diuretic). General instructions  Pay attention to any changes in your symptoms.  Follow instructions from your health care provider about limiting salt (sodium) in your diet. Sometimes, eating less salt may reduce swelling.  Moisturize skin daily to help prevent skin from cracking and draining.  Keep all follow-up visits as told by your health care provider. This is important. Contact a health care provider if you have:  A fever.  Edema that starts suddenly or is getting worse, especially if you are pregnant or have a medical condition.  Swelling in only one leg.  Increased swelling, redness, or pain in one or both of your legs.  Drainage or sores at the area where you have edema. Get help right away if you:  Develop shortness of breath, especially when you are lying down.  Have pain in your chest or abdomen.  Feel weak.  Feel faint. Summary  Peripheral edema is swelling that is caused by a buildup of fluid. Peripheral edema most often affects the lower legs, ankles, and feet.  Move around often to prevent stiffness and to lessen swelling. Do not sit or stand for long periods of time.  Pay attention to any changes in your symptoms.  Contact a health care provider if you have edema that starts suddenly or is getting worse, especially if you are pregnant or have a medical condition.  Get help right away if you develop shortness of breath, especially when lying down. This information is not intended to replace advice given to you by your health care provider. Make sure you discuss any questions you have with your health care provider. Document Revised: 08/09/2018 Document Reviewed: 08/09/2018 Elsevier Patient Education  2021 [employer].

## 2021-04-25 LAB — URINE CULTURE
MICRO NUMBER:: 11943333
SPECIMEN QUALITY:: ADEQUATE

## 2021-04-25 NOTE — Progress Notes (Signed)
Mixed genital flora isolated in urine culture. Hopefully, she is better now.

## 2021-06-15 ENCOUNTER — Ambulatory Visit: Payer: Medicare PPO | Admitting: Family

## 2021-06-15 ENCOUNTER — Encounter: Payer: Self-pay | Admitting: Family

## 2021-06-15 ENCOUNTER — Other Ambulatory Visit: Payer: Self-pay

## 2021-06-15 VITALS — BP 110/70 | HR 94 | Temp 98.0°F | Resp 16 | Ht 66.5 in | Wt 160.8 lb

## 2021-06-15 DIAGNOSIS — R399 Unspecified symptoms and signs involving the genitourinary system: Secondary | ICD-10-CM

## 2021-06-15 DIAGNOSIS — R2681 Unsteadiness on feet: Secondary | ICD-10-CM

## 2021-06-15 DIAGNOSIS — W19XXXA Unspecified fall, initial encounter: Secondary | ICD-10-CM

## 2021-06-15 DIAGNOSIS — R0609 Other forms of dyspnea: Secondary | ICD-10-CM

## 2021-06-15 DIAGNOSIS — M25561 Pain in right knee: Secondary | ICD-10-CM | POA: Diagnosis not present

## 2021-06-15 DIAGNOSIS — G8929 Other chronic pain: Secondary | ICD-10-CM

## 2021-06-15 DIAGNOSIS — R0600 Dyspnea, unspecified: Secondary | ICD-10-CM

## 2021-06-15 DIAGNOSIS — M25562 Pain in left knee: Secondary | ICD-10-CM

## 2021-06-15 DIAGNOSIS — Y92009 Unspecified place in unspecified non-institutional (private) residence as the place of occurrence of the external cause: Secondary | ICD-10-CM

## 2021-06-15 LAB — POCT URINALYSIS DIPSTICK
Bilirubin, UA: POSITIVE
Glucose, UA: NEGATIVE
Ketones, UA: POSITIVE
Nitrite, UA: POSITIVE
Protein, UA: NEGATIVE
Spec Grav, UA: >=1.03 — AB (ref 1.010–1.025)
Urobilinogen, UA: NEGATIVE E.U./dL — AB
pH, UA: 5 (ref 5.0–8.0)

## 2021-06-15 LAB — CBC WITH DIFFERENTIAL/PLATELET
Absolute Monocytes: 502 cells/uL (ref 200–950)
Basophils Absolute: 50 cells/uL (ref 0–200)
Basophils Relative: 0.8 %
Eosinophils Absolute: 81 cells/uL (ref 15–500)
Eosinophils Relative: 1.3 %
HCT: 36.4 % (ref 35.0–45.0)
Hemoglobin: 11 g/dL — ABNORMAL LOW (ref 11.7–15.5)
Lymphs Abs: 1668 cells/uL (ref 850–3900)
MCH: 24.6 pg — ABNORMAL LOW (ref 27.0–33.0)
MCHC: 30.2 g/dL — ABNORMAL LOW (ref 32.0–36.0)
MCV: 81.3 fL (ref 80.0–100.0)
MPV: 12 fL (ref 7.5–12.5)
Monocytes Relative: 8.1 %
Neutro Abs: 3900 cells/uL (ref 1500–7800)
Neutrophils Relative %: 62.9 %
Platelets: 212 10*3/uL (ref 140–400)
RBC: 4.48 10*6/uL (ref 3.80–5.10)
RDW: 15.1 % — ABNORMAL HIGH (ref 11.0–15.0)
Total Lymphocyte: 26.9 %
WBC: 6.2 10*3/uL (ref 3.8–10.8)

## 2021-06-15 LAB — BASIC METABOLIC PANEL WITH GFR
BUN/Creatinine Ratio: 17 (calc) (ref 6–22)
BUN: 23 mg/dL (ref 7–25)
CO2: 25 mmol/L (ref 20–32)
Calcium: 9.6 mg/dL (ref 8.6–10.4)
Chloride: 108 mmol/L (ref 98–110)
Creat: 1.38 mg/dL — ABNORMAL HIGH (ref 0.60–0.95)
Glucose, Bld: 94 mg/dL (ref 65–139)
Potassium: 3.8 mmol/L (ref 3.5–5.3)
Sodium: 142 mmol/L (ref 135–146)
eGFR: 37 mL/min/{1.73_m2} — ABNORMAL LOW (ref >=60)

## 2021-06-15 NOTE — Progress Notes (Signed)
Provider: Marlowe Sax FNP-C  Basem Yannuzzi, Nelda Bucks, NP  Patient Care Team: Ravindra Baranek, Nelda Bucks, NP as PCP - General (Family Medicine) Belva Crome, MD as Consulting Physician (Cardiology)  Extended Emergency Contact Information Primary Emergency Contact: [place] Phone: [phone] Mobile Phone: [phone] Relation: Daughter  Code Status:  Full Code  Goals of care: Advanced Directive information Advanced Directives 06/15/2021  Does Patient Have a Medical Advance Directive? No  Would patient like information on creating a medical advance directive? No - Patient declined     Chief Complaint  Patient presents with   Acute Visit    Complains of being sore from fall 06/11/2021. Patient also coming back from [place] from 06/01/2021 and having a cough/coughing up phlegm.     HPI:  Pt is a 87 y.o. female seen today for an acute visit for evaluation of soreness from fall 06/11/2021 fell in the bedroom.she scooted to another room to try and get up. Right hip worsen than the left.Has taken tylenol with some relief.  Has been coughing up phelgm since she went to [place]  in July 4th,2022.Denies any fever,chills,runny nose or shortness of breath. No contact with sick person with COVID-19. Daughter states patient having difficulties getting in and out of the bed would like hospital bed to help with transfer and repositioning due to her knee pain to alleviate pain in positioning in ways not visible with a regular bed.   Past Medical History:  Diagnosis Date   Anemia    Arthritis    Ataxia    Back problem    Bowel trouble    Breast cancer ([place])    Carpal tunnel syndrome    Constipation    Disturbance, sleep    Dyspnea    increased exertion    Frequent headaches    GI bleed    History of mammogram 2000   History of urinary tract infection    Hypertension    Obesity    Pain in both knees    Sepsis ([place])    Urinary incontinence    Past  Surgical History:  Procedure Laterality Date   BREAST SURGERY     for cancer-right breast   COLON SURGERY     secondary to bowel blockages   EYE SURGERY     cataract surgery bilateral    gastric stapling     KNEE ARTHROPLASTY Left 10/07/2016   Procedure: LEFT TOTAL KNEE ARTHROPLASTY WITH COMPUTER NAVIGATION;  Surgeon: Rod Can, MD;  Location: [facility];  Service: Orthopedics;  Laterality: Left;    Allergies  Allergen Reactions   Ace Inhibitors Cough   Lortab [Hydrocodone-Acetaminophen]     Bad dreams   Remeron [Mirtazapine] Other (See Comments)    Hallucinations (can take 1/2 tablet--7.5 mg), not 15 mg   Penicillins Rash and Other (See Comments)    Throat swelling, Has patient had a PCN reaction causing immediate rash, facial/tongue/throat swelling, SOB or lightheadedness with hypotension: Yes Has patient had a PCN reaction causing severe rash involving mucus membranes or skin necrosis: No Has patient had a PCN reaction that required hospitalization No Has patient had a PCN reaction occurring within the last 10 years: No If all of the above answers are "NO", then may proceed with Cephalosporin use.     Outpatient Encounter Medications as of 06/15/2021  Medication Sig   acetaminophen (TYLENOL) 650 MG CR tablet Take 650 mg by mouth daily as needed for pain.   Butalbital-Acetaminophen 25-325  MG TABS Take 1 tablet by mouth as needed.   CALCIUM PO Take 1 capsule by mouth daily.   cycloSPORINE (RESTASIS) 0.05 % ophthalmic emulsion Place 1 drop into both eyes as needed.   losartan (COZAAR) 50 MG tablet Take 50 mg by mouth daily.   Multiple Vitamin (MULTIVITAMIN) tablet Take 1 tablet by mouth daily.   Omega-3 Fatty Acids (FISH OIL PO) Take 1 capsule by mouth daily.   omeprazole (PRILOSEC) 40 MG capsule TAKE 1 CAPSULE BY MOUTH ONCE DAILY BEFORE BREAKFAST   OVER THE COUNTER MEDICATION Take 1 capsule by mouth daily. Gluco Gel Joint & Cartilage Support   No facility-administered  encounter medications on file as of 06/15/2021.    Review of Systems  Constitutional:  Negative for appetite change, chills, fatigue, fever and unexpected weight change.  HENT:  Negative for congestion, dental problem, ear discharge, ear pain, facial swelling, hearing loss, nosebleeds, postnasal drip, rhinorrhea, sinus pressure, sinus pain, sneezing, sore throat, tinnitus and trouble swallowing.   Eyes:  Negative for pain, discharge, redness, itching and visual disturbance.  Respiratory:  Negative for chest tightness, shortness of breath and wheezing.        Occasional cough  Dyspnea with exertion   Cardiovascular:  Negative for chest pain, palpitations and leg swelling.  Gastrointestinal:  Negative for abdominal distention, abdominal pain, blood in stool, constipation, diarrhea, nausea and vomiting.  Endocrine: Negative for cold intolerance, heat intolerance, polydipsia, polyphagia and polyuria.  Genitourinary:  Negative for difficulty urinating, dysuria, flank pain and urgency.       Incontinent   Musculoskeletal:  Positive for arthralgias and gait problem. Negative for back pain, joint swelling, myalgias, neck pain and neck stiffness.  Skin:  Negative for color change, pallor, rash and wound.  Neurological:  Negative for dizziness, syncope, speech difficulty, weakness, light-headedness, numbness and headaches.  Hematological:  Does not bruise/bleed easily.  Psychiatric/Behavioral:  Negative for agitation, behavioral problems, confusion, hallucinations and sleep disturbance. The patient is not nervous/anxious.        Memory loss    Immunization History  Administered Date(s) Administered   Influenza, High Dose Seasonal PF 08/13/2019   Pertinent  Health Maintenance Due  Topic Date Due   DEXA SCAN  Never done   PNA vac Low Risk Adult (1 of 2 - PCV13) Never done   INFLUENZA VACCINE  06/29/2021   Fall Risk  06/15/2021 04/23/2021 03/06/2021 11/15/2016 11/13/2016  Falls in the past year? 1 - 1  No Yes  Number falls in past yr: 0 0 0 - 2 or more  Injury with Fall? 1 0 0 - No  Comment - - - - just had some bruises.  Risk for fall due to : History of fall(s) - - - -  Follow up Falls evaluation completed - - - -   Functional Status Survey:    Vitals:   06/15/21 1434  BP: 110/70  Pulse: 94  Resp: 16  Temp: 98 F (36.7 C)  SpO2: 98%  Weight: 160 lb 12.8 oz (72.9 kg)  Height: 5' 6.5" (1.689 m)   Body mass index is 25.56 kg/m. Physical Exam Vitals reviewed.  Constitutional:      General: She is not in acute distress.    Appearance: Normal appearance. She is normal weight. She is not ill-appearing or diaphoretic.  HENT:     Head: Normocephalic.     Right Ear: Tympanic membrane, ear canal and external ear normal. There is no impacted cerumen.  Left Ear: Tympanic membrane, ear canal and external ear normal. There is no impacted cerumen.     Nose: Nose normal. No congestion or rhinorrhea.     Mouth/Throat:     Mouth: Mucous membranes are moist.     Pharynx: Oropharynx is clear. No oropharyngeal exudate or posterior oropharyngeal erythema.  Eyes:     General: No scleral icterus.       Right eye: No discharge.        Left eye: No discharge.     Extraocular Movements: Extraocular movements intact.     Conjunctiva/sclera: Conjunctivae normal.     Pupils: Pupils are equal, round, and reactive to light.  Neck:     Vascular: No carotid bruit.  Cardiovascular:     Rate and Rhythm: Normal rate and regular rhythm.     Pulses: Normal pulses.     Heart sounds: Normal heart sounds. No murmur heard.   No friction rub. No gallop.  Pulmonary:     Effort: Pulmonary effort is normal. No respiratory distress.     Breath sounds: Normal breath sounds. No wheezing, rhonchi or rales.  Chest:     Chest wall: No tenderness.  Abdominal:     General: Bowel sounds are normal. There is no distension.     Palpations: Abdomen is soft. There is no mass.     Tenderness: There is no  abdominal tenderness. There is no right CVA tenderness, left CVA tenderness, guarding or rebound.  Musculoskeletal:        General: No swelling.     Cervical back: Normal range of motion. No rigidity or tenderness.     Right knee: No swelling, effusion, erythema, ecchymosis or crepitus. Normal range of motion. No tenderness.     Left knee: No swelling, effusion, erythema, ecchymosis or crepitus. Normal range of motion. No tenderness.     Right lower leg: No edema.     Left lower leg: No edema.     Comments: Unsteady gait ambulates with a cane has walker but does not use it.   Lymphadenopathy:     Cervical: No cervical adenopathy.  Skin:    General: Skin is warm and dry.     Coloration: Skin is not pale.     Findings: No bruising, erythema, lesion or rash.  Neurological:     Mental Status: She is alert. Mental status is at baseline.     Cranial Nerves: No cranial nerve deficit.     Sensory: No sensory deficit.     Motor: No weakness.     Coordination: Coordination normal.     Gait: Gait abnormal.  Psychiatric:        Mood and Affect: Mood normal.        Speech: Speech normal.        Behavior: Behavior normal.        Thought Content: Thought content normal.        Cognition and Memory: Memory is impaired.        Judgment: Judgment normal.    Labs reviewed: Recent Labs    01/29/21 1625  NA 141  K 4.4  CL 108*  CO2 21  GLUCOSE 119*  BUN 22  CREATININE 1.18*  CALCIUM 9.3   Recent Labs    01/29/21 1625  AST 18  ALT 14  ALKPHOS 65  BILITOT 0.4  PROT 6.6  ALBUMIN 3.8   Recent Labs    01/29/21 1625  WBC 5.5  HGB 10.7*  HCT 33.7*  MCV 81  PLT 194   Lab Results  Component Value Date   TSH 1.200 01/29/2021   No results found for: HGBA1C No results found for: CHOL, HDL, LDLCALC, LDLDIRECT, TRIG, CHOLHDL  Significant Diagnostic Results in last 30 days:  No results found.  Assessment/Plan 1. Unsteady gait Remains high risk for falls due to poor safety  awareness  Continue to encourage use her walker  - Ambulatory referral to Home Health : Physical therapy for ROM,exercise and muscle strengthening. - CBC with Differential/Platelet - BMP with eGFR([facility]) - For home use only DME Hospital bed  2. Fall in home, initial encounter No injuries sustained  Fall and safety precaution. - Ambulatory referral to [facility] for Physical therapy as above  - CBC with Differential/Platelet - BMP with eGFR([facility]) - For home use only DME Hospital bed to assist in transfer in and out of the bed.   3. Symptoms of urinary tract infection Afebrile. - POC Urinalysis Dipstick indicates dark yellow cloudy urine with ketones,trace blood , large Leukocytes with Nitrites positive.will send urine for culture made aware results will be back in three days then will call with results. - Urine Culture; Future - daughter to notify provider if running any fever,chills or symptoms worsen  4. Chronic pain of both knees Chronic worsening gait stability.PT as above - For home use only DME Hospital bed: to alleviate pain in positioning in ways not visible with a regular bed.   5. Dyspnea on exertion Chronic though dyspnea during visit.  Bilateral lungs CTA - For home use only DME Hospital bed to allow head of bed to be repositioned in ways not visible with regular bed.   Family/ staff Communication: Reviewed plan of care with patient and daughter verbalized understanding   Labs/tests ordered:  - CBC with Differential/Platelet - BMP with eGFR([facility])  Next Appointment: As needed if symptoms worsen or fail to improve    Sandrea Hughs, NP

## 2021-06-15 NOTE — Patient Instructions (Signed)
Physical Therapist ordered today for ROM,exercise,gait stability and muscle strengthening.Agency will call you for appointment.

## 2021-06-17 ENCOUNTER — Other Ambulatory Visit: Payer: Self-pay

## 2021-06-17 DIAGNOSIS — R399 Unspecified symptoms and signs involving the genitourinary system: Secondary | ICD-10-CM

## 2021-06-18 ENCOUNTER — Telehealth: Payer: Self-pay

## 2021-06-18 NOTE — Telephone Encounter (Signed)
Late documentation. I called patient daughter 06/15/2021 to ask where DME form should be faxed to or if she wanted to pick it up from [facility] office. Unable to reach patient daughter. So voicemail was left 06/15/2021 on phone. Called patient daughter today 06/18/2021 at 3:17. Unable to reach patient daughter so voicemail was left again with office call back number.

## 2021-06-19 LAB — URINE CULTURE
MICRO NUMBER:: 12144784
Result:: NO GROWTH
SPECIMEN QUALITY:: ADEQUATE

## 2021-06-22 NOTE — Telephone Encounter (Signed)
I called patient daughter again. Patient daughter and I spoke. She states that patient needs everything that comes with a "hospital bed". Patient DME order on states "with half rail for repositioning". Patient daughter also request that order be faxed to "[facility]". Message routed to PCP Ngetich, Nelda Bucks, NP for edits.

## 2021-06-22 NOTE — Telephone Encounter (Signed)
Does patient need a mattress too to be ordered ?

## 2021-06-22 NOTE — Telephone Encounter (Signed)
Patient daughter called and paper re faxed. Patient daughter advised to call office back if she still needs anything else redone for paper.

## 2021-06-22 NOTE — Telephone Encounter (Signed)
I'm not understanding the question. Message routed back to PCP Ngetich, Dinah C, NP .

## 2021-06-22 NOTE — Telephone Encounter (Signed)
Patient clarify " everything that comes with Hospital bed" You the mattress ?

## 2021-06-22 NOTE — Telephone Encounter (Signed)
Please refer to previous message

## 2021-06-23 ENCOUNTER — Telehealth: Payer: Self-pay | Admitting: Family

## 2021-06-23 ENCOUNTER — Telehealth: Payer: Self-pay | Admitting: *Deleted

## 2021-06-23 DIAGNOSIS — G8929 Other chronic pain: Secondary | ICD-10-CM

## 2021-06-23 DIAGNOSIS — R32 Unspecified urinary incontinence: Secondary | ICD-10-CM

## 2021-06-23 DIAGNOSIS — G479 Sleep disorder, unspecified: Secondary | ICD-10-CM

## 2021-06-23 DIAGNOSIS — I951 Orthostatic hypotension: Secondary | ICD-10-CM | POA: Diagnosis not present

## 2021-06-23 DIAGNOSIS — K5900 Constipation, unspecified: Secondary | ICD-10-CM

## 2021-06-23 DIAGNOSIS — I10 Essential (primary) hypertension: Secondary | ICD-10-CM | POA: Diagnosis not present

## 2021-06-23 DIAGNOSIS — M1711 Unilateral primary osteoarthritis, right knee: Secondary | ICD-10-CM | POA: Diagnosis not present

## 2021-06-23 DIAGNOSIS — D649 Anemia, unspecified: Secondary | ICD-10-CM

## 2021-06-23 DIAGNOSIS — G5600 Carpal tunnel syndrome, unspecified upper limb: Secondary | ICD-10-CM | POA: Diagnosis not present

## 2021-06-23 DIAGNOSIS — M25562 Pain in left knee: Secondary | ICD-10-CM

## 2021-06-23 NOTE — Telephone Encounter (Signed)
Tracy Faulkner with [facility] requesting verbal orders for Home Health PT 2x4, 1x4wks.  Verbal orders given.

## 2021-06-23 NOTE — Telephone Encounter (Signed)
Phone call received from [facility] request face to face notes for Hospital bed.Please fax notes to Home health

## 2021-06-23 NOTE — Telephone Encounter (Signed)
OV notes faxed to [facility] Fax: [phone]

## 2021-06-26 ENCOUNTER — Ambulatory Visit: Payer: Medicare PPO | Admitting: Podiatry

## 2021-06-29 ENCOUNTER — Encounter: Payer: Self-pay | Admitting: Podiatry

## 2021-06-29 ENCOUNTER — Other Ambulatory Visit: Payer: Self-pay

## 2021-06-29 ENCOUNTER — Ambulatory Visit: Payer: Medicare PPO | Admitting: Podiatry

## 2021-06-29 DIAGNOSIS — M79671 Pain in right foot: Secondary | ICD-10-CM | POA: Diagnosis not present

## 2021-06-29 DIAGNOSIS — B351 Tinea unguium: Secondary | ICD-10-CM

## 2021-06-29 DIAGNOSIS — L84 Corns and callosities: Secondary | ICD-10-CM | POA: Diagnosis not present

## 2021-06-29 DIAGNOSIS — M79672 Pain in left foot: Secondary | ICD-10-CM

## 2021-06-29 DIAGNOSIS — M79674 Pain in right toe(s): Secondary | ICD-10-CM | POA: Diagnosis not present

## 2021-06-29 DIAGNOSIS — M79675 Pain in left toe(s): Secondary | ICD-10-CM

## 2021-06-29 DIAGNOSIS — Q828 Other specified congenital malformations of skin: Secondary | ICD-10-CM

## 2021-06-29 NOTE — Progress Notes (Signed)
    Subjective:  Patient ID: Tracy Faulkner, female    DOB: October 23, 1932,  MRN: [id number]  Tracy Faulkner presents to clinic today for corn(s) left 2nd digit , callus(es) plantarly of both feet and painful mycotic nails.  Pain interferes with ambulation. Aggravating factors include wearing enclosed shoe gear. Painful toenails interfere with ambulation. Aggravating factors include wearing enclosed shoe gear. Pain is relieved with periodic professional debridement. Painful corns and calluses are aggravated when weightbearing with and without shoegear. Pain is relieved with periodic professional debridement.  Her daughter is present during today's visit.  PCP is Ngetich, Dinah C, NP , and last visit was 06/15/2021.  Allergies  Allergen Reactions   Ace Inhibitors Cough   Lortab [Hydrocodone-Acetaminophen]     Bad dreams   Remeron [Mirtazapine] Other (See Comments)    Hallucinations (can take 1/2 tablet--7.5 mg), not 15 mg   Penicillins Rash and Other (See Comments)    Throat swelling, Has patient had a PCN reaction causing immediate rash, facial/tongue/throat swelling, SOB or lightheadedness with hypotension: Yes Has patient had a PCN reaction causing severe rash involving mucus membranes or skin necrosis: No Has patient had a PCN reaction that required hospitalization No Has patient had a PCN reaction occurring within the last 10 years: No If all of the above answers are "NO", then may proceed with Cephalosporin use.     Review of Systems: Negative except as noted in the HPI. Objective:   Constitutional Tracy Faulkner is a pleasant 87 y.o. African American female, in NAD. AAO x 3.   Vascular Capillary refill time to digits immediate b/l. Palpable DP pulse(s) b/l lower extremities Palpable PT pulse(s) b/l lower extremities Pedal hair sparse. Lower extremity skin temperature gradient within normal limits. No cyanosis or clubbing noted.  Neurologic Normal speech. Oriented to person, place, and  time. Protective sensation intact 5/5 intact bilaterally with 10g monofilament b/l.  Dermatologic Pedal skin with normal turgor, texture and tone b/l lower extremities. No open wounds b/l lower extremities. No interdigital macerations b/l lower extremities. Toenails 1-5 b/l elongated, discolored, dystrophic, thickened, crumbly with subungual debris and tenderness to dorsal palpation. Hyperkeratotic lesion(s) L 2nd toe and submet head 1 left foot.  No erythema, no edema, no drainage, no fluctuance. Porokeratotic lesion(s) submet head 1 right foot. No erythema, no edema, no drainage, no fluctuance.  Orthopedic: Normal muscle strength 5/5 to all lower extremity muscle groups bilaterally. Hallux valgus with bunion deformity noted b/l lower extremities. Hammertoe(s) noted to the 2-5 bilaterally. 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   Radiographs: None Assessment:   1. Pain due to onychomycosis of toenails of both feet   2. Porokeratosis   3. Corns and callosities   4. Pain in both feet    Plan:  -Examined patient. -Patient to continue soft, supportive shoe gear daily. -Toenails 1-5 b/l were debrided in length and girth with sterile nail nippers and dremel without iatrogenic bleeding.  -Corn(s) L 2nd toe and callus(es) submet head 1 left foot were pared utilizing sterile scalpel blade without incident. Total number debrided =2. -Painful porokeratotic lesion(s) submet head 1 right foot pared and enucleated with sterile scalpel blade without incident. Total number of lesions debrided=1. -Patient to report any pedal injuries to medical professional immediately. -Patient/POA to call should there be question/concern in the interim.  Return in about 3 months (around 09/29/2021).  Marzetta Board, DPM

## 2021-07-28 ENCOUNTER — Other Ambulatory Visit: Payer: Self-pay | Admitting: *Deleted

## 2021-07-28 DIAGNOSIS — K219 Gastro-esophageal reflux disease without esophagitis: Secondary | ICD-10-CM

## 2021-07-28 MED ORDER — OMEPRAZOLE 40 MG PO CPDR: DELAYED_RELEASE_CAPSULE | ORAL | 1 refills | Status: DC

## 2021-07-28 MED ORDER — LOSARTAN POTASSIUM 50 MG PO TABS: 50.0000 mg | ORAL_TABLET | Freq: Every day | ORAL | 1 refills | Status: DC

## 2021-07-28 MED ORDER — CYCLOSPORINE 0.05 % OP EMUL: 1.0000 [drp] | OPHTHALMIC | 3 refills | Status: DC | PRN

## 2021-07-28 NOTE — Telephone Encounter (Signed)
Patient daughter, Lynelle Smoke, requested refills.

## 2021-08-05 NOTE — Progress Notes (Signed)
Patient never applied monitor. Order will be cancelled

## 2021-08-12 ENCOUNTER — Telehealth: Payer: Self-pay

## 2021-08-12 ENCOUNTER — Other Ambulatory Visit: Payer: Self-pay

## 2021-08-12 ENCOUNTER — Encounter: Payer: Self-pay | Admitting: Family

## 2021-08-12 ENCOUNTER — Ambulatory Visit (INDEPENDENT_AMBULATORY_CARE_PROVIDER_SITE_OTHER): Payer: Medicare PPO | Admitting: Family

## 2021-08-12 DIAGNOSIS — Z0000 Encounter for general adult medical examination without abnormal findings: Secondary | ICD-10-CM

## 2021-08-12 NOTE — Telephone Encounter (Signed)
[name], Tracy Faulkner are scheduled for a virtual visit with your provider today.    Just as we do with appointments in the office, we must obtain your consent to participate.  Your consent will be active for this visit and any virtual visit you may have with one of our providers in the next 365 days.    If you have a MyChart account, I can also send a copy of this consent to you electronically.  All virtual visits are billed to your insurance company just like a traditional visit in the office.  As this is a virtual visit, video technology does not allow for your provider to perform a traditional examination.  This may limit your provider's ability to fully assess your condition.  If your provider identifies any concerns that need to be evaluated in person or the need to arrange testing such as labs, EKG, etc, we will make arrangements to do so.    Although advances in technology are sophisticated, we cannot ensure that it will always work on either your end or our end.  If the connection with a video visit is poor, we may have to switch to a telephone visit.  With either a video or telephone visit, we are not always able to ensure that we have a secure connection.   I need to obtain your verbal consent now.   Are you willing to proceed with your visit today?   Tracy Faulkner and daughter Tracy Faulkner has provided verbal consent on 08/12/2021 for a virtual visit (video or telephone).   Tracy Faulkner [place], [place] 08/12/2021  8:53 AM '

## 2021-08-12 NOTE — Patient Instructions (Signed)
[name] , Thank you for taking time to come for your Medicare Wellness Visit. I appreciate your ongoing commitment to your health goals. Please review the following plan we discussed and let me know if I can assist you in the future.   Screening recommendations/referrals: Colonoscopy N/A  Mammogram  Bone Density  Recommended yearly ophthalmology/optometry visit for glaucoma screening and checkup Recommended yearly dental visit for hygiene and checkup  Vaccinations: Influenza vaccine Due  Pneumococcal vaccine Up to date  Tdap vaccine Up to date  Shingles vaccine Up to date     Advanced directives: No   Conditions/risks identified: Advance age female > 46 yrs,Hypertension   Next appointment: 1 year    Preventive Care 9 Years and Older, Female Preventive care refers to lifestyle choices and visits with your health care provider that can promote health and wellness. What does preventive care include? A yearly physical exam. This is also called an annual well check. Dental exams once or twice a year. Routine eye exams. Ask your health care provider how often you should have your eyes checked. Personal lifestyle choices, including: Daily care of your teeth and gums. Regular physical activity. Eating a healthy diet. Avoiding tobacco and drug use. Limiting alcohol use. Practicing safe sex. Taking low-dose aspirin every day. Taking vitamin and mineral supplements as recommended by your health care provider. What happens during an annual well check? The services and screenings done by your health care provider during your annual well check will depend on your age, overall health, lifestyle risk factors, and family history of disease. Counseling  Your health care provider may ask you questions about your: Alcohol use. Tobacco use. Drug use. Emotional well-being. Home and relationship well-being. Sexual activity. Eating habits. History of falls. Memory and ability to  understand (cognition). Work and work Statistician. Reproductive health. Screening  You may have the following tests or measurements: Height, weight, and BMI. Blood pressure. Lipid and cholesterol levels. These may be checked every 5 years, or more frequently if you are over 52 years old. Skin check. Lung cancer screening. You may have this screening every year starting at age 26 if you have a 30-pack-year history of smoking and currently smoke or have quit within the past 15 years. Fecal occult blood test (FOBT) of the stool. You may have this test every year starting at age 38. Flexible sigmoidoscopy or colonoscopy. You may have a sigmoidoscopy every 5 years or a colonoscopy every 10 years starting at age 56. Hepatitis C blood test. Hepatitis B blood test. Sexually transmitted disease (STD) testing. Diabetes screening. This is done by checking your blood sugar (glucose) after you have not eaten for a while (fasting). You may have this done every 1-3 years. Bone density scan. This is done to screen for osteoporosis. You may have this done starting at age 58. Mammogram. This may be done every 1-2 years. Talk to your health care provider about how often you should have regular mammograms. Talk with your health care provider about your test results, treatment options, and if necessary, the need for more tests. Vaccines  Your health care provider may recommend certain vaccines, such as: Influenza vaccine. This is recommended every year. Tetanus, diphtheria, and acellular pertussis (Tdap, Td) vaccine. You may need a Td booster every 10 years. Zoster vaccine. You may need this after age 77. Pneumococcal 13-valent conjugate (PCV13) vaccine. One dose is recommended after age 78. Pneumococcal polysaccharide (PPSV23) vaccine. One dose is recommended after age 7. Talk to your  health care provider about which screenings and vaccines you need and how often you need them. This information is not  intended to replace advice given to you by your health care provider. Make sure you discuss any questions you have with your health care provider. Document Released: 12/12/2015 Document Revised: 08/04/2016 Document Reviewed: 09/16/2015 Elsevier Interactive Patient Education  2017 [employer] Prevention in the Home Falls can cause injuries. They can happen to people of all ages. There are many things you can do to make your home safe and to help prevent falls. What can I do on the outside of my home? Regularly fix the edges of walkways and driveways and fix any cracks. Remove anything that might make you trip as you walk through a door, such as a raised step or threshold. Trim any bushes or trees on the path to your home. Use bright outdoor lighting. Clear any walking paths of anything that might make someone trip, such as rocks or tools. Regularly check to see if handrails are loose or broken. Make sure that both sides of any steps have handrails. Any raised decks and porches should have guardrails on the edges. Have any leaves, snow, or ice cleared regularly. Use sand or salt on walking paths during winter. Clean up any spills in your garage right away. This includes oil or grease spills. What can I do in the bathroom? Use night lights. Install grab bars by the toilet and in the tub and shower. Do not use towel bars as grab bars. Use non-skid mats or decals in the tub or shower. If you need to sit down in the shower, use a plastic, non-slip stool. Keep the floor dry. Clean up any water that spills on the floor as soon as it happens. Remove soap buildup in the tub or shower regularly. Attach bath mats securely with double-sided non-slip rug tape. Do not have throw rugs and other things on the floor that can make you trip. What can I do in the bedroom? Use night lights. Make sure that you have a light by your bed that is easy to reach. Do not use any sheets or blankets that are  too big for your bed. They should not hang down onto the floor. Have a firm chair that has side arms. You can use this for support while you get dressed. Do not have throw rugs and other things on the floor that can make you trip. What can I do in the kitchen? Clean up any spills right away. Avoid walking on wet floors. Keep items that you use a lot in easy-to-reach places. If you need to reach something above you, use a strong step stool that has a grab bar. Keep electrical cords out of the way. Do not use floor polish or wax that makes floors slippery. If you must use wax, use non-skid floor wax. Do not have throw rugs and other things on the floor that can make you trip. What can I do with my stairs? Do not leave any items on the stairs. Make sure that there are handrails on both sides of the stairs and use them. Fix handrails that are broken or loose. Make sure that handrails are as long as the stairways. Check any carpeting to make sure that it is firmly attached to the stairs. Fix any carpet that is loose or worn. Avoid having throw rugs at the top or bottom of the stairs. If you do have throw rugs, attach them  to the floor with carpet tape. Make sure that you have a light switch at the top of the stairs and the bottom of the stairs. If you do not have them, ask someone to add them for you. What else can I do to help prevent falls? Wear shoes that: Do not have high heels. Have rubber bottoms. Are comfortable and fit you well. Are closed at the toe. Do not wear sandals. If you use a stepladder: Make sure that it is fully opened. Do not climb a closed stepladder. Make sure that both sides of the stepladder are locked into place. Ask someone to hold it for you, if possible. Clearly mark and make sure that you can see: Any grab bars or handrails. First and last steps. Where the edge of each step is. Use tools that help you move around (mobility aids) if they are needed. These  include: Canes. Walkers. Scooters. Crutches. Turn on the lights when you go into a dark area. Replace any light bulbs as soon as they burn out. Set up your furniture so you have a clear path. Avoid moving your furniture around. If any of your floors are uneven, fix them. If there are any pets around you, be aware of where they are. Review your medicines with your doctor. Some medicines can make you feel dizzy. This can increase your chance of falling. Ask your doctor what other things that you can do to help prevent falls. This information is not intended to replace advice given to you by your health care provider. Make sure you discuss any questions you have with your health care provider. Document Released: 09/11/2009 Document Revised: 04/22/2016 Document Reviewed: 12/20/2014 Elsevier Interactive Patient Education  2017 [employer].

## 2021-08-12 NOTE — Progress Notes (Signed)
This service is provided via telemedicine  No vital signs collected/recorded due to the encounter was a telemedicine visit.   Location of patient (ex: home, work):  Home  Patient consents to a telephone visit: Yes, see telephone visit dated 08/12/21  Location of the provider (ex: office, home):  [facility], Office   Name of any referring provider:  N/A  Names of all persons participating in the telemedicine service and their role in the encounter:  S.Chrae B/CMA, Marlowe Sax, NP, [name] (daughter), and Patient   Time spent on call:  17 min with medical assistant     Subjective:   Tracy Faulkner is a 87 y.o. female who presents for Medicare Annual (Subsequent) preventive examination.  Review of Systems     Cardiac Risk Factors include: advanced age (>91mn, >>9women);hypertension     Objective:    Today's Vitals   08/12/21 0851  PainSc: 3    There is no height or weight on file to calculate BMI.  Advanced Directives 08/12/2021 06/15/2021 04/23/2021 03/06/2021 11/13/2016 10/07/2016 10/01/2016  Does Patient Have a Medical Advance Directive? No No No No No No No  Would patient like information on creating a medical advance directive? Yes (MAU/Ambulatory/Procedural Areas - Information given) No - Patient declined No - Patient declined No - Patient declined - - Yes - Educational materials given    Current Medications (verified) Outpatient Encounter Medications as of 08/12/2021  Medication Sig   acetaminophen (TYLENOL) 650 MG CR tablet Take 650 mg by mouth daily as needed for pain.   Butalbital-Acetaminophen 25-325 MG TABS Take 1 tablet by mouth as needed.   CALCIUM PO Take 1 capsule by mouth daily.   cycloSPORINE (RESTASIS) 0.05 % ophthalmic emulsion Place 1 drop into both eyes as needed.   losartan (COZAAR) 50 MG tablet Take 1 tablet (50 mg total) by mouth daily.   Multiple Vitamin (MULTIVITAMIN) tablet Take 1 tablet by mouth daily.   Omega-3  Fatty Acids (FISH OIL PO) Take 1 capsule by mouth daily.   omeprazole (PRILOSEC) 40 MG capsule TAKE 1 CAPSULE BY MOUTH ONCE DAILY BEFORE BREAKFAST   OVER THE COUNTER MEDICATION Take 1 capsule by mouth daily. Gluco Gel Joint & Cartilage Support   No facility-administered encounter medications on file as of 08/12/2021.    Allergies (verified) Ace inhibitors, Lortab [hydrocodone-acetaminophen], Remeron [mirtazapine], and Penicillins   History: Past Medical History:  Diagnosis Date   Anemia    Arthritis    Ataxia    Back problem    Bowel trouble    Breast cancer ([facility]    Carpal tunnel syndrome    Constipation    Disturbance, sleep    Dyspnea    increased exertion    Frequent headaches    GI bleed    History of mammogram 2000   History of urinary tract infection    Hypertension    Obesity    Pain in both knees    [place]    Urinary incontinence    Past Surgical History:  Procedure Laterality Date   BREAST SURGERY     for cancer-right breast   COLON SURGERY     secondary to bowel blockages   EYE SURGERY     cataract surgery bilateral    gastric stapling     KNEE ARTHROPLASTY Left 10/07/2016   Procedure: LEFT TOTAL KNEE ARTHROPLASTY WITH COMPUTER NAVIGATION;  Surgeon: BRod Can MD;  Location: [facility];  Service: Orthopedics;  Laterality: Left;   Family History  Problem Relation Age of Onset   Arthritis Mother    Cancer Mother    Diabetes Mellitus II Mother    Hypertension Mother    Kidney disease Mother    Social History   Socioeconomic History   Marital status: Widowed    Spouse name: Not on file   Number of children: Not on file   Years of education: 8   Highest education level: Not on file  Occupational History   Occupation: unemployed  Tobacco Use   Smoking status: Never   Smokeless tobacco: Never  Vaping Use   Vaping Use: Never used  Substance and Sexual Activity   Alcohol use: No    Alcohol/week: 0.0 standard drinks   Drug use: No    Sexual activity: Never  Other Topics Concern   Not on file  Social History Narrative   Tobacco use, amount per day now: N/A   Past tobacco use, amount per day:   How many years did you use tobacco:   Alcohol use (drinks per week): N/A   Diet: Regular, Limited salt.   Do you drink/eat things with caffeine: Yes.   Marital status:  Widowed                                What year were you married?   Do you live in a house, apartment, assisted living, condo, trailer, etc.? House.   Is it one or more stories? Yes.   How many persons live in your home? 4   Do you have pets in your home?( please list) No   Highest Level of education completed? 8th grade.   Current or past profession: Domestic, Environmental manager, Sports coach employed.   Do you exercise? Some                                 Type and how often?   Do you have a living will? No   Do you have a DNR form? No                                  If not, do you want to discuss one?   Do you have signed POA/HPOA forms? No                       If so, please bring to you appointment      Do you have any difficulty bathing or dressing yourself? No, minimal assistance.   Do you have any difficulty preparing food or eating? No, limited preparation.    Do you have any difficulty managing your medications? Yes, managed by family   Do you have any difficulty managing your finances? Yes, managed by family.   Do you have any difficulty affording your medications? No.   Social Determinants of Health   Financial Resource Strain: Not on file  Food Insecurity: Not on file  Transportation Needs: Not on file  Physical Activity: Not on file  Stress: Not on file  Social Connections: Not on file    Tobacco Counseling Counseling given: Not Answered   Clinical Intake:  Pre-visit preparation completed: No  Pain : 0-10 Pain Score: 3  Pain Type: Chronic pain Pain Location: Knee (back) Pain Orientation: Lower Pain  Radiating Towards: no Pain Descriptors /  Indicators: Aching Pain Onset: More than a month ago Pain Frequency: Intermittent Pain Relieving Factors: tylenol Effect of Pain on Daily Activities: yes  Pain Relieving Factors: tylenol  BMI - recorded: 25.57 Nutritional Status: BMI 25 -29 Overweight Nutritional Risks: None Diabetes: No  How often do you need to have someone help you when you read instructions, pamphlets, or other written materials from your doctor or pharmacy?: 3 - Sometimes What is the last grade level you completed in school?: 8 grade  Diabetic?No   Interpreter Needed?: No  Information entered by :: Deaisha Welborn,FNP-C   Activities of Daily Living In your present state of health, do you have any difficulty performing the following activities: 08/12/2021  Hearing? Y  Vision? [facility]  Difficulty concentrating or making decisions? Y  Comment memory  Walking or climbing stairs? Y  Comment uses a walker/cane  Dressing or bathing? N  Doing errands, shopping? Y  Comment family assist  Preparing Food and eating ? N  Using the Toilet? Y  Comment constipation  In the past six months, have you accidently leaked urine? Y  Comment wears pull ups  Do you have problems with loss of bowel control? N  Managing your Medications? Y  Comment daughter  Managing your Finances? N  Housekeeping or managing your Housekeeping? Y  Comment daughter  Some recent data might be hidden    Patient Care Team: Ved Martos, Nelda Bucks, NP as PCP - General (Family Medicine) Belva Crome, MD as Consulting Physician (Cardiology)  Indicate any recent Medical Services you may have received from other than [facility] providers in the past year (date may be approximate).     Assessment:   This is a routine wellness examination for [facility]  Hearing/Vision screen Hearing Screening - Comments:: Decreased hearing, does not wear aids. Feels like something is inside of ears.  Vision Screening - Comments:: Last eye  exam less than 12 months ago. January 2022  Dietary issues and exercise activities discussed: Current Exercise Habits: Home exercise routine, Type of exercise: walking;stretching, Time (Minutes): 15, Frequency (Times/Week): 3, Weekly Exercise (Minutes/Week): 45, Intensity: Mild, Exercise limited by: Other - see comments (unsteady gait,knee /back pain)   Goals Addressed             This Visit's Progress    Patient Stated       Would like do brain activities to improve her memory        Depression Screen PHQ 2/9 Scores 08/12/2021 11/15/2016  PHQ - 2 Score 0 0    Fall Risk Fall Risk  08/12/2021 06/15/2021 04/23/2021 03/06/2021 11/15/2016  Falls in the past year? 1 1 - 1 No  Number falls in past yr: 1 0 0 0 -  Injury with Fall? 0 1 0 0 -  Comment - - - - -  Risk for fall due to : History of fall(s) History of fall(s) - - -  Follow up Falls evaluation completed Falls evaluation completed - - -    FALL RISK PREVENTION PERTAINING TO THE HOME:  Any stairs in or around the home? No  If so, are there any without handrails? No  Home free of loose throw rugs in walkways, pet beds, electrical cords, etc? No  Adequate lighting in your home to reduce risk of falls? Yes   ASSISTIVE DEVICES UTILIZED TO PREVENT FALLS:  Life alert? Yes  Use of a cane, walker or w/c? No  Grab bars in the bathroom? No  Shower chair or bench in shower? No  Elevated toilet seat or a handicapped toilet? Yes   TIMED UP AND GO:  Was the test performed? No .  Length of time to ambulate 10 feet: N/A sec.   Gait slow and steady with assistive device  Cognitive Function:     6CIT Screen 08/12/2021  What Year? 4 points  What month? 3 points  What time? 0 points  Count back from 20 0 points  Months in reverse 0 points  Repeat phrase 8 points  Total Score 15    Immunizations Immunization History  Administered Date(s) Administered   Influenza, High Dose Seasonal PF 08/13/2019   Janssen (J&J) SARS-COV-2  Vaccination 02/03/2011   Pneumococcal Conjugate-13 08/18/2015   Pneumococcal Polysaccharide-23 12/13/2017   Tdap 10/11/2017   Zoster Recombinat (Shingrix) 10/11/2017, 12/13/2017    TDAP status: Up to date  Flu Vaccine status: Due, Education has been provided regarding the importance of this vaccine. Advised may receive this vaccine at local pharmacy or Health Dept. Aware to provide a copy of the vaccination record if obtained from local pharmacy or Health Dept. Verbalized acceptance and understanding.  Pneumococcal vaccine status: Up to date  Covid-19 vaccine status: Information provided on how to obtain vaccines.   Qualifies for Shingles Vaccine? Yes   Zostavax completed Yes   Shingrix Completed?: Yes  Screening Tests Health Maintenance  Topic Date Due   DEXA SCAN  Never done   COVID-19 Vaccine (2 - Janssen risk series) 03/03/2011   INFLUENZA VACCINE  06/29/2021   TETANUS/TDAP  10/12/2027   PNA vac Low Risk Adult  Completed   Zoster Vaccines- Shingrix  Completed   HPV VACCINES  Aged Out    Health Maintenance  Health Maintenance Due  Topic Date Due   DEXA SCAN  Never done   COVID-19 Vaccine (2 - Janssen risk series) 03/03/2011   INFLUENZA VACCINE  06/29/2021    Colorectal cancer screening: No longer required.   Mammogram status: No longer required due to advance age .  Bone Density status: Ordered  . Pt provided with contact info and advised to call to schedule appt.  Lung Cancer Screening: (Low Dose CT Chest recommended if Age 51-80 years, 30 pack-year currently smoking OR have quit w/in 15years.) does not qualify.   Lung Cancer Screening Referral: No  Additional Screening:  Hepatitis C Screening: does not qualify; Completed No   Vision Screening: Recommended annual ophthalmology exams for early detection of glaucoma and other disorders of the eye. Is the patient up to date with their annual eye exam?  Yes  Who is the provider or what is the name of the office  in which the patient attends annual eye exams? Sees Ophthal[facility] in [facility] If pt is not established with a provider, would they like to be referred to a provider to establish care? No .   Dental Screening: Recommended annual dental exams for proper oral hygiene  Community Resource Referral / Chronic Care Management: CRR required this visit?  No   CCM required this visit?  No      Plan:     I have personally reviewed and noted the following in the patient's chart:   Medical and social history Use of alcohol, tobacco or illicit drugs  Current medications and supplements including opioid prescriptions.  Functional ability and status Nutritional status Physical activity Advanced directives List of other physicians Hospitalizations, surgeries, and ER visits in previous 62  months Vitals Screenings to include cognitive, depression, and falls Referrals and appointments  In addition, I have reviewed and discussed with patient certain preventive protocols, quality metrics, and best practice recommendations. A written personalized care plan for preventive services as well as general preventive health recommendations were provided to patient.     Sandrea Hughs, NP   08/12/2021   Nurse Notes: Advised to get COVID-19 vaccine at the pharmacy.Influenza vaccine during visit at office.

## 2021-09-02 ENCOUNTER — Telehealth: Payer: Self-pay | Admitting: *Deleted

## 2021-09-02 NOTE — Telephone Encounter (Signed)
[name], daughter, dropped off FMLA paperwork to be completed for [facility] to care for family member.  Requesting leave for 08/24/2021-08/23/2022 Paperwork due: 09/09/2021 Leave Request #: [id number]  Requesting Paperwork to be faxed to [facility] Fax: [phone] once completed and then daughter will pick up original paperwork at appointment on 10/10  Placed paperwork in Dinah's folder to review, fill out and sign.

## 2021-09-04 ENCOUNTER — Other Ambulatory Visit: Payer: Medicare PPO

## 2021-09-04 ENCOUNTER — Other Ambulatory Visit: Payer: Self-pay

## 2021-09-04 DIAGNOSIS — D509 Iron deficiency anemia, unspecified: Secondary | ICD-10-CM

## 2021-09-04 DIAGNOSIS — I10 Essential (primary) hypertension: Secondary | ICD-10-CM

## 2021-09-05 LAB — CBC WITH DIFFERENTIAL/PLATELET
Absolute Monocytes: 332 cells/uL (ref 200–950)
Basophils Absolute: 41 cells/uL (ref 0–200)
Basophils Relative: 1 %
Eosinophils Absolute: 139 cells/uL (ref 15–500)
Eosinophils Relative: 3.4 %
HCT: 33.2 % — ABNORMAL LOW (ref 35.0–45.0)
Hemoglobin: 10.2 g/dL — ABNORMAL LOW (ref 11.7–15.5)
Lymphs Abs: 1287 cells/uL (ref 850–3900)
MCH: 25.1 pg — ABNORMAL LOW (ref 27.0–33.0)
MCHC: 30.7 g/dL — ABNORMAL LOW (ref 32.0–36.0)
MCV: 81.8 fL (ref 80.0–100.0)
MPV: 11.2 fL (ref 7.5–12.5)
Monocytes Relative: 8.1 %
Neutro Abs: 2300 cells/uL (ref 1500–7800)
Neutrophils Relative %: 56.1 %
Platelets: 182 10*3/uL (ref 140–400)
RBC: 4.06 10*6/uL (ref 3.80–5.10)
RDW: 14.4 % (ref 11.0–15.0)
Total Lymphocyte: 31.4 %
WBC: 4.1 10*3/uL (ref 3.8–10.8)

## 2021-09-05 LAB — COMPLETE METABOLIC PANEL WITH GFR
AG Ratio: 1.2 (calc) (ref 1.0–2.5)
ALT: 16 U/L (ref 6–29)
AST: 26 U/L (ref 10–35)
Albumin: 3.7 g/dL (ref 3.6–5.1)
Alkaline phosphatase (APISO): 64 U/L (ref 37–153)
BUN/Creatinine Ratio: 15 (calc) (ref 6–22)
BUN: 15 mg/dL (ref 7–25)
CO2: 26 mmol/L (ref 20–32)
Calcium: 9.5 mg/dL (ref 8.6–10.4)
Chloride: 109 mmol/L (ref 98–110)
Creat: 1.02 mg/dL — ABNORMAL HIGH (ref 0.60–0.95)
Globulin: 3 g/dL (calc) (ref 1.9–3.7)
Glucose, Bld: 92 mg/dL (ref 65–99)
Potassium: 3.7 mmol/L (ref 3.5–5.3)
Sodium: 141 mmol/L (ref 135–146)
Total Bilirubin: 0.8 mg/dL (ref 0.2–1.2)
Total Protein: 6.7 g/dL (ref 6.1–8.1)
eGFR: 53 mL/min/{1.73_m2} — ABNORMAL LOW (ref >=60)

## 2021-09-05 LAB — TSH: TSH: 1.58 mIU/L (ref 0.40–4.50)

## 2021-09-07 ENCOUNTER — Other Ambulatory Visit: Payer: Self-pay

## 2021-09-07 ENCOUNTER — Encounter: Payer: Self-pay | Admitting: Family

## 2021-09-07 ENCOUNTER — Ambulatory Visit: Payer: Self-pay | Admitting: Family

## 2021-09-07 VITALS — BP 120/80 | HR 72 | Temp 97.1°F | Resp 16 | Ht 66.5 in | Wt 171.6 lb

## 2021-09-07 DIAGNOSIS — F411 Generalized anxiety disorder: Secondary | ICD-10-CM

## 2021-09-07 DIAGNOSIS — K219 Gastro-esophageal reflux disease without esophagitis: Secondary | ICD-10-CM

## 2021-09-07 DIAGNOSIS — D509 Iron deficiency anemia, unspecified: Secondary | ICD-10-CM

## 2021-09-07 DIAGNOSIS — R600 Localized edema: Secondary | ICD-10-CM

## 2021-09-07 DIAGNOSIS — I10 Essential (primary) hypertension: Secondary | ICD-10-CM

## 2021-09-07 DIAGNOSIS — Z23 Encounter for immunization: Secondary | ICD-10-CM

## 2021-09-07 DIAGNOSIS — K5901 Slow transit constipation: Secondary | ICD-10-CM

## 2021-09-07 DIAGNOSIS — M159 Polyosteoarthritis, unspecified: Secondary | ICD-10-CM

## 2021-09-07 DIAGNOSIS — Z029 Encounter for administrative examinations, unspecified: Secondary | ICD-10-CM

## 2021-09-07 MED ORDER — POLYETHYLENE GLYCOL 3350 17 GM/SCOOP PO POWD
17.0000 g | Freq: Every day | ORAL | 1 refills | Status: DC
Start: 2021-09-07 — End: 2022-07-28

## 2021-09-07 NOTE — Patient Instructions (Signed)
-   please check weight at home and notify provider for any abrupt  weight gain   -Knee high compression stockings wear in the morning and remove at bedtime for leg swelling.

## 2021-09-07 NOTE — Progress Notes (Signed)
Provider: Marlowe Sax FNP-C   Torin Modica, Nelda Bucks, NP  Patient Care Team: Alayziah Tangeman, Nelda Bucks, NP as PCP - General (Family Medicine) Belva Crome, MD as Consulting Physician (Cardiology)  Extended Emergency Contact Information Primary Emergency Contact: [place] Phone: [phone] Mobile Phone: [phone] Relation: Daughter  Code Status:  Full Code  Goals of care: Advanced Directive information Advanced Directives 09/07/2021  Does Patient Have a Medical Advance Directive? No  Would patient like information on creating a medical advance directive? No - Patient declined     Chief Complaint  Patient presents with   Medical Management of Chronic Issues    6 month follow up   Health Maintenance    Discuss the need for Dexa Scan.    Immunizations    Discuss the need for 2nd Covid Vaccine and Influenza vaccine.     HPI:  Pt is a 87 y.o. female seen today for 6 months follow up for medical management of chronic diseases. She is here with her daughter.states fell out of the bed last Friday.Had another fall within the past month trying to pick up weeds outside her yard. She recently completed Physical Therapy recommended walker but prefers to use her cane. B/p at home has been good. Does own sponge bath but needs assistance washing her back.dresses her self but needs assistance with grooming and wearing shoes.  Has had leg swelling.No shortness of breath or wheezing.    Past Medical History:  Diagnosis Date   Anemia    Arthritis    Ataxia    Back problem    Bowel trouble    Breast cancer ([place])    Carpal tunnel syndrome    Constipation    Disturbance, sleep    Dyspnea    increased exertion    Frequent headaches    GI bleed    History of mammogram 2000   History of urinary tract infection    Hypertension    Obesity    Pain in both knees    Sepsis ([place])    Urinary incontinence    Past Surgical History:  Procedure Laterality  Date   BREAST SURGERY     for cancer-right breast   COLON SURGERY     secondary to bowel blockages   EYE SURGERY     cataract surgery bilateral    gastric stapling     KNEE ARTHROPLASTY Left 10/07/2016   Procedure: LEFT TOTAL KNEE ARTHROPLASTY WITH COMPUTER NAVIGATION;  Surgeon: Rod Can, MD;  Location: [facility];  Service: Orthopedics;  Laterality: Left;    Allergies  Allergen Reactions   Ace Inhibitors Cough   Lortab [Hydrocodone-Acetaminophen]     Bad dreams   Remeron [Mirtazapine] Other (See Comments)    Hallucinations (can take 1/2 tablet--7.5 mg), not 15 mg   Penicillins Rash and Other (See Comments)    Throat swelling, Has patient had a PCN reaction causing immediate rash, facial/tongue/throat swelling, SOB or lightheadedness with hypotension: Yes Has patient had a PCN reaction causing severe rash involving mucus membranes or skin necrosis: No Has patient had a PCN reaction that required hospitalization No Has patient had a PCN reaction occurring within the last 10 years: No If all of the above answers are "NO", then may proceed with Cephalosporin use.     Allergies as of 09/07/2021       Reactions   Ace Inhibitors Cough   Lortab [hydrocodone-acetaminophen]    Bad dreams   Remeron [mirtazapine] Other (  See Comments)   Hallucinations (can take 1/2 tablet--7.5 mg), not 15 mg   Penicillins Rash, Other (See Comments)   Throat swelling, Has patient had a PCN reaction causing immediate rash, facial/tongue/throat swelling, SOB or lightheadedness with hypotension: Yes Has patient had a PCN reaction causing severe rash involving mucus membranes or skin necrosis: No Has patient had a PCN reaction that required hospitalization No Has patient had a PCN reaction occurring within the last 10 years: No If all of the above answers are "NO", then may proceed with Cephalosporin use.        Medication List        Accurate as of September 07, 2021  9:43 AM. If you have any  questions, ask your nurse or doctor.          acetaminophen 650 MG CR tablet Commonly known as: TYLENOL Take 650 mg by mouth daily as needed for pain.   Butalbital-Acetaminophen 25-325 MG Tabs Take 1 tablet by mouth as needed.   CALCIUM PO Take 1 capsule by mouth daily.   cycloSPORINE 0.05 % ophthalmic emulsion Commonly known as: RESTASIS Place 1 drop into both eyes as needed.   FISH OIL PO Take 1 capsule by mouth daily.   losartan 50 MG tablet Commonly known as: COZAAR Take 1 tablet (50 mg total) by mouth daily.   multivitamin tablet Take 1 tablet by mouth daily.   omeprazole 40 MG capsule Commonly known as: PRILOSEC TAKE 1 CAPSULE BY MOUTH ONCE DAILY BEFORE BREAKFAST   OVER THE COUNTER MEDICATION Take 1 capsule by mouth daily. Gluco Gel Joint & Cartilage Support        Review of Systems  Constitutional:  Negative for appetite change, chills, fatigue, fever and unexpected weight change.  HENT:  Negative for congestion, dental problem, ear discharge, ear pain, facial swelling, hearing loss, nosebleeds, postnasal drip, rhinorrhea, sinus pressure, sinus pain, sneezing, sore throat, tinnitus and trouble swallowing.   Eyes:  Positive for visual disturbance. Negative for pain, discharge, redness and itching.       Eye glasses.watery and dry   Respiratory:  Negative for cough, chest tightness, shortness of breath and wheezing.   Cardiovascular:  Positive for leg swelling. Negative for chest pain and palpitations.  Gastrointestinal:  Negative for abdominal distention, abdominal pain, blood in stool, constipation, diarrhea, nausea and vomiting.  Endocrine: Negative for cold intolerance, heat intolerance, polydipsia, polyphagia and polyuria.  Genitourinary:  Negative for difficulty urinating, dysuria, flank pain, frequency and urgency.  Musculoskeletal:  Positive for arthralgias and gait problem. Negative for back pain, joint swelling, myalgias, neck pain and neck  stiffness.  Skin:  Negative for color change, pallor, rash and wound.  Neurological:  Negative for dizziness, syncope, speech difficulty, weakness, light-headedness, numbness and headaches.  Hematological:  Does not bruise/bleed easily.  Psychiatric/Behavioral:  Negative for agitation, behavioral problems, confusion, hallucinations, self-injury, sleep disturbance and suicidal ideas. The patient is not nervous/anxious.    Immunization History  Administered Date(s) Administered   Fluad Quad(high Dose 65+) 09/07/2021   Influenza, High Dose Seasonal PF 08/13/2019   Janssen (J&J) SARS-COV-2 Vaccination 02/03/2011   Pneumococcal Conjugate-13 08/18/2015   Pneumococcal Polysaccharide-23 12/13/2017   Tdap 10/11/2017   Zoster Recombinat (Shingrix) 10/11/2017, 12/13/2017   Pertinent  Health Maintenance Due  Topic Date Due   DEXA SCAN  Never done   INFLUENZA VACCINE  Completed   Fall Risk  09/07/2021 08/12/2021 06/15/2021 04/23/2021 03/06/2021  Falls in the past year? 1 1 1  - 1  Number falls in past yr: 1 1 0 0 0  Injury with Fall? 0 0 1 0 0  Comment - - - - -  Risk for fall due to : History of fall(s) History of fall(s) History of fall(s) - -  Follow up Falls evaluation completed Falls evaluation completed Falls evaluation completed - -   Functional Status Survey:    Vitals:   09/07/21 0936  BP: 120/80  Pulse: 72  Resp: 16  Temp: (!) 97.1 F (36.2 C)  SpO2: 97%  Weight: 171 lb 9.6 oz (77.8 kg)  Height: 5' 6.5" (1.689 m)   Body mass index is 27.28 kg/m. Physical Exam Vitals reviewed.  Constitutional:      General: She is not in acute distress.    Appearance: Normal appearance. She is normal weight. She is not ill-appearing or diaphoretic.  HENT:     Head: Normocephalic.     Right Ear: Tympanic membrane, ear canal and external ear normal. There is no impacted cerumen.     Left Ear: Tympanic membrane, ear canal and external ear normal. There is no impacted cerumen.     Nose: Nose  normal. No congestion or rhinorrhea.     Mouth/Throat:     Mouth: Mucous membranes are moist.     Pharynx: Oropharynx is clear. No oropharyngeal exudate or posterior oropharyngeal erythema.  Eyes:     General: No scleral icterus.       Right eye: No discharge.        Left eye: No discharge.     Extraocular Movements: Extraocular movements intact.     Conjunctiva/sclera: Conjunctivae normal.     Pupils: Pupils are equal, round, and reactive to light.  Neck:     Vascular: No carotid bruit.  Cardiovascular:     Rate and Rhythm: Normal rate and regular rhythm.     Pulses: Normal pulses.     Heart sounds: Normal heart sounds. No murmur heard.   No friction rub. No gallop.  Pulmonary:     Effort: Pulmonary effort is normal. No respiratory distress.     Breath sounds: Normal breath sounds. No wheezing, rhonchi or rales.  Chest:     Chest wall: No tenderness.  Abdominal:     General: Bowel sounds are normal. There is no distension.     Palpations: Abdomen is soft. There is no mass.     Tenderness: There is no abdominal tenderness. There is no right CVA tenderness, left CVA tenderness, guarding or rebound.  Musculoskeletal:        General: No swelling or tenderness. Normal range of motion.     Cervical back: Normal range of motion. No rigidity or tenderness.     Right lower leg: No edema.     Left lower leg: No edema.     Comments: Unsteady gait   Lymphadenopathy:     Cervical: No cervical adenopathy.  Skin:    General: Skin is warm and dry.     Coloration: Skin is not pale.     Findings: No bruising, erythema, lesion or rash.  Neurological:     Mental Status: She is alert. Mental status is at baseline.     Cranial Nerves: No cranial nerve deficit.     Sensory: No sensory deficit.     Motor: No weakness.     Coordination: Coordination normal.     Gait: Gait abnormal.  Psychiatric:        Mood and Affect: Mood normal.  Speech: Speech normal.        Behavior: Behavior  normal.        Thought Content: Thought content normal.        Judgment: Judgment normal.    Labs reviewed: Recent Labs    01/29/21 1625 06/15/21 0000 09/04/21 0837  NA 141 142 141  K 4.4 3.8 3.7  CL 108* 108 109  CO2 21 25 26   GLUCOSE 119* 94 92  BUN 22 23 15   CREATININE 1.18* 1.38* 1.02*  CALCIUM 9.3 9.6 9.5   Recent Labs    01/29/21 1625 09/04/21 0837  AST 18 26  ALT 14 16  ALKPHOS 65  --   BILITOT 0.4 0.8  PROT 6.6 6.7  ALBUMIN 3.8  --    Recent Labs    01/29/21 1625 06/15/21 0000 09/04/21 0837  WBC 5.5 6.2 4.1  NEUTROABS  --  3,900 2,300  HGB 10.7* 11.0* 10.2*  HCT 33.7* 36.4 33.2*  MCV 81 81.3 81.8  PLT 194 212 182   Lab Results  Component Value Date   TSH 1.58 09/04/2021   No results found for: HGBA1C No results found for: CHOL, HDL, LDLCALC, LDLDIRECT, TRIG, CHOLHDL  Significant Diagnostic Results in last 30 days:  No results found.  Assessment/Plan 1. Need for influenza vaccination Afebrile  Flut shot administered by CMA no acute reaction reported.  - Flu Vaccine QUAD High Dose(Fluad)  2. Essential hypertension B/p well controlled. Continue on losartan   3. Edema of both lower extremities No shortness of breath  Advised to wear knee high compression stockings on in the morning and off at bedtime.Keep legs elevated when seated. - Advised to check weight at home and notify provider for any abrupt  weight gain  - Compression stockings  4. Gastroesophageal reflux disease without esophagitis Hgb has dropped compared to previous level - continue on Omeprazole  - Ambulatory referral to Gastroenterology  5. Generalized anxiety disorder Stable Continue to monitor   6. Primary osteoarthritis involving multiple joints Continue on current pain regimen  7. Iron deficiency anemia, unspecified iron deficiency anemia type Hgb has dropped compared to previous level - Ambulatory referral to Gastroenterology  8. Slow transit constipation -  encouraged to increase fiber in diet  - increase water intake to 6-8 glasses of water daily and exercise as tolerated  - start on miralax as below and hold for loose stool.Samples given.  - polyethylene glycol powder (GLYCOLAX/MIRALAX) 17 GM/SCOOP powder; Take 17 g by mouth daily. Hold for loose stool  Dispense: 3350 g; Refill: 1  Family/ staff Communication: Reviewed plan of care with patient and daughter verbalized understanding  Labs/tests ordered: None   Next Appointment : 6 months for medical management of chronic issues.Fasting Labs prior to visit.    Sandrea Hughs, NP

## 2021-09-08 NOTE — Telephone Encounter (Signed)
Paperwork Faxed yesterday and Original Copy given to daughter yesterday at Patient's appointment.   Copy sent to scanning.

## 2021-10-05 ENCOUNTER — Ambulatory Visit: Payer: Medicare PPO | Admitting: Podiatry

## 2021-10-20 ENCOUNTER — Encounter: Payer: Self-pay | Admitting: Gastroenterology

## 2021-11-02 ENCOUNTER — Encounter: Payer: Self-pay | Admitting: Podiatry

## 2021-11-02 ENCOUNTER — Other Ambulatory Visit: Payer: Self-pay

## 2021-11-02 ENCOUNTER — Ambulatory Visit: Payer: Medicare PPO | Admitting: Podiatry

## 2021-11-02 DIAGNOSIS — L84 Corns and callosities: Secondary | ICD-10-CM | POA: Diagnosis not present

## 2021-11-02 DIAGNOSIS — M2010 Hallux valgus (acquired), unspecified foot: Secondary | ICD-10-CM | POA: Insufficient documentation

## 2021-11-02 DIAGNOSIS — M79675 Pain in left toe(s): Secondary | ICD-10-CM | POA: Diagnosis not present

## 2021-11-02 DIAGNOSIS — Q828 Other specified congenital malformations of skin: Secondary | ICD-10-CM

## 2021-11-02 DIAGNOSIS — Q666 Other congenital valgus deformities of feet: Secondary | ICD-10-CM

## 2021-11-02 DIAGNOSIS — M79674 Pain in right toe(s): Secondary | ICD-10-CM

## 2021-11-02 DIAGNOSIS — B351 Tinea unguium: Secondary | ICD-10-CM

## 2021-11-02 NOTE — Progress Notes (Signed)
This patient returns to the office for evaluation and treatment of long thick painful nails .  This patient is unable to trim her own nails since the patient cannot reach her feet.  Patient says the nails are painful walking and wearing his shoes.  She also has pain in both forefeet due to callus. She presents to the office with her daughter. He returns for preventive foot care services.  General Appearance  Alert, conversant and in no acute stress.  Vascular  Dorsalis pedis and posterior tibial  pulses are  weakly palpable  bilaterally.  Capillary return is within normal limits  bilaterally. Cold feet bilaterally.  Absent digital hair.  Neurologic  Senn-Weinstein monofilament wire test within normal limits  bilaterally. Muscle power within normal limits bilaterally.  Nails Thick disfigured discolored nails with subungual debris  from hallux to fifth toes bilaterally. No evidence of bacterial infection or drainage bilaterally.  Orthopedic  No limitations of motion  feet .  No crepitus or effusions noted.  No bony pathology or digital deformities noted.  Severe HAV  B/L.  Pes planus  B/l.  Skin  Thin skin noted bilaterally. Porokeratosis sub 1 right foot.  Callus present at dorsomedial exostosis left foot. No signs of infections or ulcers noted.     Onychomycosis  Pain in toes right foot  Pain in toes left foot  Callus  B/l.  Debridement  of nails  1-5  B/L with a nail nipper.  Nails were then filed using a dremel tool with no incidents.   Debridement of callus with # 15 blade followed by dremel tool usage.   RTC 3 months    Gardiner Barefoot DPM

## 2021-11-11 ENCOUNTER — Ambulatory Visit (INDEPENDENT_AMBULATORY_CARE_PROVIDER_SITE_OTHER): Payer: Medicare PPO | Admitting: Gastroenterology

## 2021-11-11 ENCOUNTER — Encounter: Payer: Self-pay | Admitting: Gastroenterology

## 2021-11-11 ENCOUNTER — Other Ambulatory Visit (INDEPENDENT_AMBULATORY_CARE_PROVIDER_SITE_OTHER): Payer: Medicare PPO

## 2021-11-11 VITALS — BP 130/70 | HR 72 | Ht 61.5 in | Wt 162.0 lb

## 2021-11-11 DIAGNOSIS — K5900 Constipation, unspecified: Secondary | ICD-10-CM | POA: Diagnosis not present

## 2021-11-11 DIAGNOSIS — D649 Anemia, unspecified: Secondary | ICD-10-CM | POA: Diagnosis not present

## 2021-11-11 LAB — CBC WITH DIFFERENTIAL/PLATELET
Basophils Absolute: 0.1 10*3/uL (ref 0.0–0.1)
Basophils Relative: 1.2 % (ref 0.0–3.0)
Eosinophils Absolute: 0.1 10*3/uL (ref 0.0–0.7)
Eosinophils Relative: 1.6 % (ref 0.0–5.0)
HCT: 33.8 % — ABNORMAL LOW (ref 36.0–46.0)
Hemoglobin: 10.8 g/dL — ABNORMAL LOW (ref 12.0–15.0)
Lymphocytes Relative: 27.3 % (ref 12.0–46.0)
Lymphs Abs: 1.2 10*3/uL (ref 0.7–4.0)
MCHC: 31.8 g/dL (ref 30.0–36.0)
MCV: 78.3 fl (ref 78.0–100.0)
Monocytes Absolute: 0.3 10*3/uL (ref 0.1–1.0)
Monocytes Relative: 7.4 % (ref 3.0–12.0)
Neutro Abs: 2.8 10*3/uL (ref 1.4–7.7)
Neutrophils Relative %: 62.5 % (ref 43.0–77.0)
Platelets: 174 10*3/uL (ref 150.0–400.0)
RBC: 4.32 Mil/uL (ref 3.87–5.11)
RDW: 16.4 % — ABNORMAL HIGH (ref 11.5–15.5)
WBC: 4.5 10*3/uL (ref 4.0–10.5)

## 2021-11-11 LAB — IBC PANEL
Iron: 84 ug/dL (ref 42–145)
Saturation Ratios: 26.4 % (ref 20.0–50.0)
TIBC: 317.8 ug/dL (ref 250.0–450.0)
Transferrin: 227 mg/dL (ref 212.0–360.0)

## 2021-11-11 LAB — FERRITIN: Ferritin: 23.6 ng/mL (ref 10.0–291.0)

## 2021-11-11 NOTE — Progress Notes (Signed)
HPI : Tracy Faulkner is a pleasant 87 year old female with a remote history of gastric bypass surgery (1980s) who is referred by Marlowe Sax, NP for further evaluation of anemia.  There is mention of iron deficiency, but I could not find report of any previous iron panel to confirm this.  The patient's most recent hemoglobin in October was 10.2, down from her previous level of 11 in July.  Review of previous labs show that her hgb is typically in the 9-10 range.  The patient's daughter says that she has always been anemic.   She denies any chronic GI symptoms other than constipation.  She takes Miralax on occasion.  Sometimes has loose stools.  No abdominal pain.  No dysphagia. Her daughter is concerned about a suspected hernia.  The patient denies any pain or discomfort related to a hernia.  She has a history of a severe upper GI bleed from 2019.  I could not find the endoscopy report, but other notes a suspected gastric ulcer and a large amount of blood in the stomach. She received multiple units of blood and was in the hospital over a week.  She has never had a colonoscopy.  Patient lives with her daughter and grandchildren     Past Medical History:  Diagnosis Date   Anemia    Arthritis    Ataxia    Back problem    Bowel trouble    Breast cancer ([place])    Carpal tunnel syndrome    Constipation    Disturbance, sleep    Dyspnea    increased exertion    Frequent headaches    GI bleed    History of mammogram 2000   History of urinary tract infection    Hypertension    Obesity    Pain in both knees    Sepsis ([place])    Urinary incontinence      Past Surgical History:  Procedure Laterality Date   BREAST SURGERY     for cancer-right breast   COLON SURGERY     secondary to bowel blockages   EYE SURGERY     cataract surgery bilateral    gastric stapling     KNEE ARTHROPLASTY Left 10/07/2016   Procedure: LEFT TOTAL KNEE ARTHROPLASTY WITH COMPUTER NAVIGATION;  Surgeon: Rod Can, MD;  Location: [facility];  Service: Orthopedics;  Laterality: Left;   Family History  Problem Relation Age of Onset   Arthritis Mother    Diabetes Mellitus II Mother    Hypertension Mother    Breast cancer Mother    Heart disease Father    Social History   Tobacco Use   Smoking status: Never   Smokeless tobacco: Never  Vaping Use   Vaping Use: Never used  Substance Use Topics   Alcohol use: No    Alcohol/week: 0.0 standard drinks   Drug use: No   Current Outpatient Medications  Medication Sig Dispense Refill   acetaminophen (TYLENOL) 650 MG CR tablet Take 650 mg by mouth daily as needed for pain.     CALCIUM PO Take 1 capsule by mouth daily.     cycloSPORINE (RESTASIS) 0.05 % ophthalmic emulsion Place 1 drop into both eyes as needed. 0.4 mL 3   losartan (COZAAR) 50 MG tablet Take 1 tablet (50 mg total) by mouth daily. 90 tablet 1   Multiple Vitamin (MULTIVITAMIN) tablet Take 1 tablet by mouth daily.     Omega-3 Fatty Acids (FISH OIL PO) Take 1  capsule by mouth daily.     omeprazole (PRILOSEC) 40 MG capsule TAKE 1 CAPSULE BY MOUTH ONCE DAILY BEFORE BREAKFAST 90 capsule 1   OVER THE COUNTER MEDICATION Take 1 capsule by mouth daily. Gluco Gel Joint & Cartilage Support     polyethylene glycol powder (GLYCOLAX/MIRALAX) 17 GM/SCOOP powder Take 17 g by mouth daily. Hold for loose stool 3350 g 1   No current facility-administered medications for this visit.   Allergies  Allergen Reactions   Ace Inhibitors Cough   Lortab [Hydrocodone-Acetaminophen]     Bad dreams   Remeron [Mirtazapine] Other (See Comments)    Hallucinations (can take 1/2 tablet--7.5 mg), not 15 mg   Penicillins Rash and Other (See Comments)    Throat swelling, Has patient had a PCN reaction causing immediate rash, facial/tongue/throat swelling, SOB or lightheadedness with hypotension: Yes Has patient had a PCN reaction causing severe rash involving mucus membranes or skin necrosis: No Has patient had a  PCN reaction that required hospitalization No Has patient had a PCN reaction occurring within the last 10 years: No If all of the above answers are "NO", then may proceed with Cephalosporin use.      Review of Systems: All systems reviewed and negative except where noted in HPI.    No results found.  Physical Exam: BP 130/70    Pulse 72    Ht 5' 1.5" (1.562 m)    Wt 162 lb (73.5 kg)    BMI 30.11 kg/m  Constitutional: Pleasant,frail, tired appearing African American female in no acute distress.  Accompanied by her daughter who provides most of the history.  The patient is minimally engaged, but does answer questions appropriately HEENT: Normocephalic and atraumatic. Conjunctivae are pale. No scleral icterus. Neck supple.  Cardiovascular: Normal rate, regular rhythm.  Pulmonary/chest: Effort normal and breath sounds normal. No wheezing, rales or rhonchi. Abdominal: Soft, nondistended, nontender. Bowel sounds active throughout. There are no masses palpable. No hepatomegaly.  Large abdominal scar with large mobile fullness located to the left of the scar Extremities: no edema Neurological: Alert and oriented to person place and time. Skin: Skin is warm and dry. No rashes noted. Psychiatric: Normal mood and affect. Behavior is normal.  Patient seems largely disinterested in the clinical visit  CBC    Component Value Date/Time   WBC 4.1 09/04/2021 0837   RBC 4.06 09/04/2021 0837   HGB 10.2 (L) 09/04/2021 0837   HGB 10.7 (L) 01/29/2021 1625   HGB 11.5 (L) 10/07/2008 1618   HCT 33.2 (L) 09/04/2021 0837   HCT 33.7 (L) 01/29/2021 1625   HCT 34.8 10/07/2008 1618   PLT 182 09/04/2021 0837   PLT 194 01/29/2021 1625   MCV 81.8 09/04/2021 0837   MCV 81 01/29/2021 1625   MCV 77.1 (L) 10/07/2008 1618   MCH 25.1 (L) 09/04/2021 0837   MCHC 30.7 (L) 09/04/2021 0837   RDW 14.4 09/04/2021 0837   RDW 14.8 01/29/2021 1625   RDW 16.4 (H) 10/07/2008 1618   LYMPHSABS 1,287 09/04/2021 0837    LYMPHSABS 1.5 10/07/2008 1618   MONOABS 0.4 10/07/2008 1618   EOSABS 139 09/04/2021 0837   EOSABS 0.2 10/07/2008 1618   BASOSABS 41 09/04/2021 0837   BASOSABS 0.0 10/07/2008 1618    CMP     Component Value Date/Time   NA 141 09/04/2021 0837   NA 141 01/29/2021 1625   K 3.7 09/04/2021 0837   CL 109 09/04/2021 0837   CO2 26 09/04/2021 0837  GLUCOSE 92 09/04/2021 0837   BUN 15 09/04/2021 0837   BUN 22 01/29/2021 1625   CREATININE 1.02 (H) 09/04/2021 0837   CALCIUM 9.5 09/04/2021 0837   PROT 6.7 09/04/2021 0837   PROT 6.6 01/29/2021 1625   ALBUMIN 3.8 01/29/2021 1625   AST 26 09/04/2021 0837   ALT 16 09/04/2021 0837   ALKPHOS 65 01/29/2021 1625   BILITOT 0.8 09/04/2021 0837   BILITOT 0.4 01/29/2021 1625   GFRNONAA 25 (L) 11/13/2016 1408   GFRAA 29 (L) 11/13/2016 1408     ASSESSMENT AND PLAN: 87 year old female with a remote history of gastric bypass who 'has always been anemic' per the daughter, referred to [place] for a 1 point drop in hemoglobin in October compared to June.  Review of our previous indicates that the patient's hemoglobin has historically been around 9-10.  This is presumably from iron deficiency related to her bypass anatomy (PPI therapy may also be contributing).  Will recheck CBC today and get iron panel.  Although an upper and lower endoscopy would be warranted, given the patient's age and frail state, as well as the stability of her chronic anemia, I think an endoscopic evaluation would pose more risk than benefit. Similarly, on exam, the patient appears to have an incisional hernia. The hernia is not tender and the patient denies abdominal pain.  I recommended against surgical evaluation as the risks of surgery outweigh the benefits in an asymptomatic hernia. Regarding her constipation, I recommended she take Metamucil daily which may provide more regular bowel movements without causing loose stools.  She can continue to take Miralax as needed.  Anemia, presumed  iron deficiency, likely secondary to gastric bypass, stable -  Repeat CBC - Iron panel - Recommend against endoscopic evaluation given age/frailty - Recommend oral iron if iron panel confirms IDA; consider IV iron if refractory to PO  Constipation - Metamucil daily - PRN Miralalx  Suspected incisional hernia - Not a good surgery candidate - Recommend against surgery referral given lack of symptoms  Chi Garlow E. Candis Schatz, MD [facility]   CC:  Ngetich, Nelda Bucks, NP

## 2021-11-11 NOTE — Patient Instructions (Signed)
If you are age 85 or older, your body mass index should be between 23-30. Your Body mass index is 30.11 kg/m. If this is out of the aforementioned range listed, please consider follow up with your Primary Care Provider.  If you are age 74 or younger, your body mass index should be between 19-25. Your Body mass index is 30.11 kg/m. If this is out of the aformentioned range listed, please consider follow up with your Primary Care Provider.   Start Metamucil daily.  Your provider has requested that you go to the basement level for lab work before leaving today. Press "B" on the elevator. The lab is located at the first door on the left as you exit the elevator.  The  GI providers would like to encourage you to use [facility] to communicate with providers for non-urgent requests or questions.  Due to long hold times on the telephone, sending your provider a message by [facility] may be a faster and more efficient way to get a response.  Please allow 48 business hours for a response.  Please remember that this is for non-urgent requests.   Due to recent changes in healthcare laws, you may see the results of your imaging and laboratory studies on MyChart before your provider has had a chance to review them.  We understand that in some cases there may be results that are confusing or concerning to you. Not all laboratory results come back in the same time frame and the provider may be waiting for multiple results in order to interpret others.  Please give [place] 48 hours in order for your provider to thoroughly review all the results before contacting the office for clarification of your results.   It was a pleasure to see you today!  Thank you for trusting me with your gastrointestinal care!    Scott E.Candis Schatz, MD

## 2021-11-13 ENCOUNTER — Encounter: Payer: Self-pay | Admitting: Gastroenterology

## 2021-11-13 NOTE — Progress Notes (Signed)
Maya, please inform Tracy Faulkner (or her daughter) that her blood counts looked good.  There was no evidence of iron deficiency and I don't think she needs to take iron supplements.  Her hgb was 10.8 which is her baseline.  Recommend she follow up with her PCM.

## 2021-12-02 DIAGNOSIS — H40013 Open angle with borderline findings, low risk, bilateral: Secondary | ICD-10-CM | POA: Diagnosis not present

## 2021-12-02 DIAGNOSIS — H524 Presbyopia: Secondary | ICD-10-CM | POA: Diagnosis not present

## 2021-12-02 DIAGNOSIS — H04123 Dry eye syndrome of bilateral lacrimal glands: Secondary | ICD-10-CM | POA: Diagnosis not present

## 2022-01-05 ENCOUNTER — Other Ambulatory Visit: Payer: Self-pay | Admitting: Family

## 2022-02-03 ENCOUNTER — Ambulatory Visit: Payer: Medicare PPO | Admitting: Podiatry

## 2022-02-03 ENCOUNTER — Other Ambulatory Visit: Payer: Self-pay

## 2022-02-03 ENCOUNTER — Encounter: Payer: Self-pay | Admitting: Podiatry

## 2022-02-03 DIAGNOSIS — M79672 Pain in left foot: Secondary | ICD-10-CM

## 2022-02-03 DIAGNOSIS — M79671 Pain in right foot: Secondary | ICD-10-CM

## 2022-02-03 DIAGNOSIS — L84 Corns and callosities: Secondary | ICD-10-CM | POA: Diagnosis not present

## 2022-02-03 DIAGNOSIS — M79674 Pain in right toe(s): Secondary | ICD-10-CM | POA: Diagnosis not present

## 2022-02-03 DIAGNOSIS — B351 Tinea unguium: Secondary | ICD-10-CM | POA: Diagnosis not present

## 2022-02-03 DIAGNOSIS — Q828 Other specified congenital malformations of skin: Secondary | ICD-10-CM

## 2022-02-03 DIAGNOSIS — M79675 Pain in left toe(s): Secondary | ICD-10-CM | POA: Diagnosis not present

## 2022-02-03 NOTE — Patient Instructions (Signed)
Recommend Skechers Loafers with stretchable uppers and memory foam insoles. They can be purchased at [employer], [employer] or [employer]. Also on [email or web address].  

## 2022-02-10 NOTE — Progress Notes (Signed)
?  Subjective:  ?Patient ID: Tracy Faulkner, female    DOB: [birth year],  MRN: [id number] ? ?Verdell Carmine presents to clinic today for callus(es) b/l feet and painful thick toenails that are difficult to trim. Painful toenails interfere with ambulation. Aggravating factors include wearing enclosed shoe gear. Pain is relieved with periodic professional debridement. Painful calluses are aggravated when weightbearing with and without shoegear. Pain is relieved with periodic professional debridement. ? ?New problem(s): None.  ? ?Patient is accompanied by her daughter on today's visit. ? ?PCP is Ngetich, Nelda Bucks, NP , and last visit was September 07, 2021. ? ?Allergies  ?Allergen Reactions  ? Ace Inhibitors Cough  ? Lortab [Hydrocodone-Acetaminophen]   ?  Bad dreams  ? Remeron [Mirtazapine] Other (See Comments)  ?  Hallucinations (can take 1/2 tablet--7.5 mg), not 15 mg  ? Penicillins Rash and Other (See Comments)  ?  Throat swelling, Has patient had a PCN reaction causing immediate rash, facial/tongue/throat swelling, SOB or lightheadedness with hypotension: Yes ?Has patient had a PCN reaction causing severe rash involving mucus membranes or skin necrosis: No ?Has patient had a PCN reaction that required hospitalization No ?Has patient had a PCN reaction occurring within the last 10 years: No ?If all of the above answers are "NO", then may proceed with Cephalosporin use. ?  ? ? ?Review of Systems: Negative except as noted in the HPI. ? ?Objective: No changes noted in today's physical examination. ?Constitutional Tracy Faulkner is a pleasant 88 y.o. African American female, in NAD. AAO x 3.   ?Vascular Capillary refill time to digits immediate b/l. Palpable DP pulse(s) b/l lower extremities. Palpable PT pulse(s) b/l lower extremities Pedal hair sparse. Lower extremity skin temperature gradient within normal limits. No cyanosis or clubbing noted.  ?Neurologic Normal speech. Oriented to person, place, and time. Protective  sensation intact 5/5 intact bilaterally with 10g monofilament b/l.  ?Dermatologic Pedal skin with normal turgor, texture and tone b/l lower extremities. No open wounds b/l lower extremities. No interdigital macerations b/l lower extremities. Toenails 1-5 b/l elongated, discolored, dystrophic, thickened, crumbly with subungual debris and tenderness to dorsal palpation. Hyperkeratotic lesion(s)  1st metatarsal head left foot.  No erythema, no edema, no drainage, no fluctuance. Porokeratotic lesion(s) submet head 1 right foot. No erythema, no edema, no drainage, no fluctuance.  ?Orthopedic: Normal muscle strength 5/5 to all lower extremity muscle groups bilaterally. Hallux valgus with bunion deformity noted b/l lower extremities. Hammertoe(s) noted to the 2-5 bilaterally. 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  ? ?Radiographs: None ? ?Assessment/Plan: ?1. Pain due to onychomycosis of toenails of both feet   ?2. Porokeratosis   ?3. Callus   ?4. Pain in both feet   ?-Mycotic toenails 1-5 bilaterally were debrided in length and girth with sterile nail nippers and dremel without incident. ?-Callus(es) 1st metatarsal head left foot pared utilizing sterile scalpel blade without complication or incident. Total number debrided =1. ?-Painful porokeratotic lesion(s) submet head 1 right foot pared and enucleated with sterile scalpel blade without incident. Total number of lesions debrided=1. ?-Recommended Skechers shoes with stretchable uppers and memory foam insoles. ?-Patient/POA to call should there be question/concern in the interim.  ? ?Return in about 9 weeks (around 04/07/2022). ? ?Marzetta Board, DPM  ?

## 2022-03-08 ENCOUNTER — Other Ambulatory Visit: Payer: Medicare PPO

## 2022-03-08 ENCOUNTER — Other Ambulatory Visit: Payer: Self-pay | Admitting: Family

## 2022-03-08 DIAGNOSIS — I10 Essential (primary) hypertension: Secondary | ICD-10-CM | POA: Diagnosis not present

## 2022-03-08 DIAGNOSIS — D509 Iron deficiency anemia, unspecified: Secondary | ICD-10-CM

## 2022-03-08 DIAGNOSIS — R600 Localized edema: Secondary | ICD-10-CM | POA: Diagnosis not present

## 2022-03-08 DIAGNOSIS — K219 Gastro-esophageal reflux disease without esophagitis: Secondary | ICD-10-CM | POA: Diagnosis not present

## 2022-03-09 LAB — COMPLETE METABOLIC PANEL WITH GFR
AG Ratio: 1.1 (calc) (ref 1.0–2.5)
ALT: 19 U/L (ref 6–29)
AST: 26 U/L (ref 10–35)
Albumin: 3.6 g/dL (ref 3.6–5.1)
Alkaline phosphatase (APISO): 61 U/L (ref 37–153)
BUN/Creatinine Ratio: 24 (calc) — ABNORMAL HIGH (ref 6–22)
BUN: 25 mg/dL (ref 7–25)
CO2: 25 mmol/L (ref 20–32)
Calcium: 9.5 mg/dL (ref 8.6–10.4)
Chloride: 112 mmol/L — ABNORMAL HIGH (ref 98–110)
Creat: 1.05 mg/dL — ABNORMAL HIGH (ref 0.60–0.95)
Globulin: 3.4 g/dL (calc) (ref 1.9–3.7)
Glucose, Bld: 88 mg/dL (ref 65–99)
Potassium: 4.3 mmol/L (ref 3.5–5.3)
Sodium: 143 mmol/L (ref 135–146)
Total Bilirubin: 0.7 mg/dL (ref 0.2–1.2)
Total Protein: 7 g/dL (ref 6.1–8.1)
eGFR: 51 mL/min/{1.73_m2} — ABNORMAL LOW (ref >=60)

## 2022-03-09 LAB — CBC WITH DIFFERENTIAL/PLATELET
Absolute Monocytes: 311 cells/uL (ref 200–950)
Basophils Absolute: 41 cells/uL (ref 0–200)
Basophils Relative: 0.9 %
Eosinophils Absolute: 50 cells/uL (ref 15–500)
Eosinophils Relative: 1.1 %
HCT: 33.3 % — ABNORMAL LOW (ref 35.0–45.0)
Hemoglobin: 10.4 g/dL — ABNORMAL LOW (ref 11.7–15.5)
Lymphs Abs: 1440 cells/uL (ref 850–3900)
MCH: 25.4 pg — ABNORMAL LOW (ref 27.0–33.0)
MCHC: 31.2 g/dL — ABNORMAL LOW (ref 32.0–36.0)
MCV: 81.4 fL (ref 80.0–100.0)
MPV: 10.8 fL (ref 7.5–12.5)
Monocytes Relative: 6.9 %
Neutro Abs: 2660 cells/uL (ref 1500–7800)
Neutrophils Relative %: 59.1 %
Platelets: 144 10*3/uL (ref 140–400)
RBC: 4.09 10*6/uL (ref 3.80–5.10)
RDW: 14.7 % (ref 11.0–15.0)
Total Lymphocyte: 32 %
WBC: 4.5 10*3/uL (ref 3.8–10.8)

## 2022-03-09 LAB — LIPID PANEL
Cholesterol: 195 mg/dL
HDL: 59 mg/dL
LDL Cholesterol (Calc): 122 mg/dL — ABNORMAL HIGH
Non-HDL Cholesterol (Calc): 136 mg/dL — ABNORMAL HIGH
Total CHOL/HDL Ratio: 3.3 (calc)
Triglycerides: 57 mg/dL

## 2022-03-09 LAB — TSH: TSH: 1 m[IU]/L (ref 0.40–4.50)

## 2022-03-12 ENCOUNTER — Encounter: Payer: Self-pay | Admitting: Family

## 2022-03-12 ENCOUNTER — Ambulatory Visit: Payer: Medicare PPO | Admitting: Family

## 2022-03-12 VITALS — BP 110/80 | HR 69 | Temp 97.5°F | Resp 16 | Ht 61.5 in | Wt 161.4 lb

## 2022-03-12 DIAGNOSIS — E785 Hyperlipidemia, unspecified: Secondary | ICD-10-CM | POA: Diagnosis not present

## 2022-03-12 DIAGNOSIS — R2681 Unsteadiness on feet: Secondary | ICD-10-CM | POA: Diagnosis not present

## 2022-03-12 DIAGNOSIS — R600 Localized edema: Secondary | ICD-10-CM

## 2022-03-12 DIAGNOSIS — F320 Major depressive disorder, single episode, mild: Secondary | ICD-10-CM | POA: Diagnosis not present

## 2022-03-12 DIAGNOSIS — M170 Bilateral primary osteoarthritis of knee: Secondary | ICD-10-CM | POA: Diagnosis not present

## 2022-03-12 DIAGNOSIS — I10 Essential (primary) hypertension: Secondary | ICD-10-CM

## 2022-03-12 DIAGNOSIS — Z853 Personal history of malignant neoplasm of breast: Secondary | ICD-10-CM | POA: Diagnosis not present

## 2022-03-12 DIAGNOSIS — D509 Iron deficiency anemia, unspecified: Secondary | ICD-10-CM

## 2022-03-12 DIAGNOSIS — Z780 Asymptomatic menopausal state: Secondary | ICD-10-CM

## 2022-03-12 NOTE — Progress Notes (Signed)
? ?Provider: Marlowe Sax FNP-C  ? ?Tracy Faulkner, Nelda Bucks, NP ? ?Patient Care Team: ?Aqsa Sensabaugh, Nelda Bucks, NP as PCP - General (Family Medicine) ?Belva Crome, MD as Consulting Physician (Cardiology) ? ?Extended Emergency Contact Information ?Primary Emergency Contact: Ilda Mori ? [place] ?Home Phone: [phone] ?Mobile Phone: [phone] ?Relation: Daughter ? ?Code Status:  Full Code  ?Goals of care: Advanced Directive information ? ?  03/12/2022  ? 10:27 AM  ?Advanced Directives  ?Does Patient Have a Medical Advance Directive? No  ?Would patient like information on creating a medical advance directive? No - Patient declined  ? ? ? ?Chief Complaint  ?Patient presents with  ? Medical Management of Chronic Issues  ?  6 month follow up.   ? Health Maintenance  ?  Discuss the need for Dexa scan.  ? Immunizations  ?  Discuss the need for second Janssen vaccine.  ? ? ?HPI:  ?Pt is a 88 y.o. female seen today for 6 months follow-up for medical management of chronic diseases. She is here with the daughter who provides additional HPI information.  Daughter states patient continues to do most of her activity of living with close supervision.  She is able to dress herself but daughter tries to pick up her clothes so that she does not miss much.  She does have a sponge bath but needs help with set up and also cleaning the back.  She is incontinent and requires assistance. ?Daughter also reports frequent falls since she was last seen here.  No injuries reported. ?Appetite has been good though has lost 1 pound. ? ?Request electric lift chair to assist in getting in and out of the chair and also keep legs elevated due to lower extremity swelling.  No shortness of breath reported. ?Recent lab work reviewed and discussed during visit. ? ?Past Medical History:  ?Diagnosis Date  ? Anemia   ? Arthritis   ? Ataxia   ? Back problem   ? Bowel trouble   ? Breast cancer ([place])   ? Carpal tunnel syndrome   ? Constipation    ? Disturbance, sleep   ? Dyspnea   ? increased exertion   ? Frequent headaches   ? GI bleed   ? History of mammogram 2000  ? History of urinary tract infection   ? Hypertension   ? Obesity   ? Pain in both knees   ? Sepsis ([employer])   ? Urinary incontinence   ? ?Past Surgical History:  ?Procedure Laterality Date  ? BREAST SURGERY    ? for cancer-right breast  ? COLON SURGERY    ? secondary to bowel blockages  ? EYE SURGERY    ? cataract surgery bilateral   ? gastric stapling    ? KNEE ARTHROPLASTY Left 10/07/2016  ? Procedure: LEFT TOTAL KNEE ARTHROPLASTY WITH COMPUTER NAVIGATION;  Surgeon: Rod Can, MD;  Location: [facility];  Service: Orthopedics;  Laterality: Left;  ? ? ?Allergies  ?Allergen Reactions  ? Ace Inhibitors Cough  ? Lortab [Hydrocodone-Acetaminophen]   ?  Bad dreams  ? Remeron [Mirtazapine] Other (See Comments)  ?  Hallucinations (can take 1/2 tablet--7.5 mg), not 15 mg  ? Penicillins Rash and Other (See Comments)  ?  Throat swelling, Has patient had a PCN reaction causing immediate rash, facial/tongue/throat swelling, SOB or lightheadedness with hypotension: Yes ?Has patient had a PCN reaction causing severe rash involving mucus membranes or skin necrosis: No ?Has patient had a PCN reaction that required hospitalization  No ?Has patient had a PCN reaction occurring within the last 10 years: No ?If all of the above answers are "NO", then may proceed with Cephalosporin use. ?  ? ? ?Allergies as of 03/12/2022   ? ?   Reactions  ? Ace Inhibitors Cough  ? Lortab [hydrocodone-acetaminophen]   ? Bad dreams  ? Remeron [mirtazapine] Other (See Comments)  ? Hallucinations (can take 1/2 tablet--7.5 mg), not 15 mg  ? Penicillins Rash, Other (See Comments)  ? Throat swelling, Has patient had a PCN reaction causing immediate rash, facial/tongue/throat swelling, SOB or lightheadedness with hypotension: Yes ?Has patient had a PCN reaction causing severe rash involving mucus membranes or skin necrosis: No ?Has patient  had a PCN reaction that required hospitalization No ?Has patient had a PCN reaction occurring within the last 10 years: No ?If all of the above answers are "NO", then may proceed with Cephalosporin use.  ? ?  ? ?  ?Medication List  ?  ? ?  ? Accurate as of March 12, 2022 10:47 AM. If you have any questions, ask your nurse or doctor.  ?  ?  ? ?  ? ?acetaminophen 650 MG CR tablet ?Commonly known as: TYLENOL ?Take 650 mg by mouth daily as needed for pain. ?  ?CALCIUM PO ?Take 1 capsule by mouth daily. ?  ?cycloSPORINE 0.05 % ophthalmic emulsion ?Commonly known as: RESTASIS ?Place 1 drop into both eyes as needed. ?  ?FISH OIL PO ?Take 1 capsule by mouth daily. ?  ?losartan 50 MG tablet ?Commonly known as: COZAAR ?Take 1 tablet by mouth once daily ?  ?multivitamin tablet ?Take 1 tablet by mouth daily. ?  ?omeprazole 40 MG capsule ?Commonly known as: PRILOSEC ?TAKE 1 CAPSULE BY MOUTH ONCE DAILY BEFORE BREAKFAST ?  ?OVER THE COUNTER MEDICATION ?Take 1 capsule by mouth daily. Gluco Gel ?Joint & Cartilage Support ?  ?polyethylene glycol powder 17 GM/SCOOP powder ?Commonly known as: GLYCOLAX/MIRALAX ?Take 17 g by mouth daily. Hold for loose stool ?  ? ?  ? ? ?Review of Systems  ?Constitutional:  Negative for appetite change, chills, fatigue, fever and unexpected weight change.  ?HENT:  Negative for congestion, dental problem, ear discharge, ear pain, facial swelling, hearing loss, nosebleeds, postnasal drip, rhinorrhea, sinus pressure, sinus pain, sneezing, sore throat, tinnitus and trouble swallowing.   ?Eyes:  Negative for pain, discharge, redness, itching and visual disturbance.  ?Respiratory:  Negative for cough, chest tightness, shortness of breath and wheezing.   ?Cardiovascular:  Negative for chest pain, palpitations and leg swelling.  ?Gastrointestinal:  Negative for abdominal distention, abdominal pain, blood in stool, constipation, diarrhea, nausea and vomiting.  ?Endocrine: Negative for cold intolerance, heat  intolerance, polydipsia, polyphagia and polyuria.  ?Genitourinary:  Negative for difficulty urinating, dysuria, flank pain, frequency and urgency.  ?Musculoskeletal:  Positive for arthralgias and gait problem. Negative for back pain, joint swelling, myalgias, neck pain and neck stiffness.  ?     High risk for falls   ?Skin:  Negative for color change, pallor, rash and wound.  ?Neurological:  Negative for dizziness, syncope, speech difficulty, weakness, light-headedness, numbness and headaches.  ?Hematological:  Does not bruise/bleed easily.  ?Psychiatric/Behavioral:  Negative for agitation, behavioral problems, confusion, hallucinations, self-injury, sleep disturbance and suicidal ideas. The patient is not nervous/anxious.   ? ?Immunization History  ?Administered Date(s) Administered  ? Fluad Quad(high Dose 65+) 09/07/2021  ? Influenza, High Dose Seasonal PF 08/13/2019  ? Influenza,inj,quad, With Preservative 09/15/2017  ? Janssen (J&J)  SARS-COV-2 Vaccination 02/03/2020  ? Pneumococcal Conjugate-13 08/18/2015  ? Pneumococcal Polysaccharide-23 12/13/2017  ? Tdap 10/11/2017  ? Zoster Recombinat (Shingrix) 10/11/2017, 12/13/2017  ? Zoster, Live 10/12/2017  ? ?Pertinent  Health Maintenance Due  ?Topic Date Due  ? DEXA SCAN  Never done  ? INFLUENZA VACCINE  06/29/2022  ? ? ?  04/23/2021  ?  3:07 PM 06/15/2021  ?  2:35 PM 08/12/2021  ?  8:38 AM 09/07/2021  ?  9:23 AM 03/12/2022  ? 10:26 AM  ?Fall Risk  ?Falls in the past year?  '1 1 1 1  '$ ?Was there an injury with Fall? 0 1 0 0 0  ?Fall Risk Category Calculator  '2 2 2 1  '$ ?Fall Risk Category  Moderate Moderate Moderate Low  ?Patient Fall Risk Level Low fall risk Moderate fall risk Moderate fall risk Moderate fall risk Low fall risk  ?Patient at Risk for Falls Due to  History of fall(s) History of fall(s) History of fall(s) History of fall(s)  ?Fall risk Follow up  Falls evaluation completed Falls evaluation completed Falls evaluation completed Falls evaluation  completed;Education provided;Falls prevention discussed  ? ?Functional Status Survey: ?  ? ?Vitals:  ? 03/12/22 1023  ?BP: 110/80  ?Pulse: 69  ?Resp: 16  ?Temp: (!) 97.5 ?F (36.4 ?C)  ?SpO2: 97%  ?Weight: 161 lb 6.4 oz (73.2 k

## 2022-04-07 ENCOUNTER — Encounter: Payer: Self-pay | Admitting: Podiatry

## 2022-04-07 ENCOUNTER — Ambulatory Visit: Payer: Medicare PPO | Admitting: Podiatry

## 2022-04-07 DIAGNOSIS — L84 Corns and callosities: Secondary | ICD-10-CM

## 2022-04-07 DIAGNOSIS — Q828 Other specified congenital malformations of skin: Secondary | ICD-10-CM

## 2022-04-07 DIAGNOSIS — B351 Tinea unguium: Secondary | ICD-10-CM

## 2022-04-07 DIAGNOSIS — M79675 Pain in left toe(s): Secondary | ICD-10-CM

## 2022-04-07 DIAGNOSIS — M79674 Pain in right toe(s): Secondary | ICD-10-CM | POA: Diagnosis not present

## 2022-04-07 DIAGNOSIS — M79672 Pain in left foot: Secondary | ICD-10-CM | POA: Diagnosis not present

## 2022-04-07 DIAGNOSIS — M79671 Pain in right foot: Secondary | ICD-10-CM | POA: Diagnosis not present

## 2022-04-17 NOTE — Progress Notes (Signed)
  Subjective:  Patient ID: Tracy Faulkner, female    DOB: 1932/09/21,  MRN: [id number]  Tracy Faulkner presents to clinic today for callus(es) b/l lower extremities, porokeratotic lesion(s) right lower extremity, and painful mycotic nails. Painful toenails interfere with ambulation. Aggravating factors include wearing enclosed shoe gear. Pain is relieved with periodic professional debridement. Painful callus(es) and porokeratotic lesion(s) are aggravated when weightbearing with and without shoegear. Pain is relieved with periodic professional debridement.  Patient is accompanied by her daughter on today's visit.  New problem(s): None.   PCP is Ngetich, Nelda Bucks, NP , and last visit was March 12, 2022.  Allergies  Allergen Reactions   Ace Inhibitors Cough   Lortab [Hydrocodone-Acetaminophen]     Bad dreams   Remeron [Mirtazapine] Other (See Comments)    Hallucinations (can take 1/2 tablet--7.5 mg), not 15 mg   Penicillins Rash and Other (See Comments)    Throat swelling, Has patient had a PCN reaction causing immediate rash, facial/tongue/throat swelling, SOB or lightheadedness with hypotension: Yes Has patient had a PCN reaction causing severe rash involving mucus membranes or skin necrosis: No Has patient had a PCN reaction that required hospitalization No Has patient had a PCN reaction occurring within the last 10 years: No If all of the above answers are "NO", then may proceed with Cephalosporin use.     Review of Systems: Negative except as noted in the HPI.  Objective: No changes noted in today's physical examination.  Constitutional Tracy Faulkner is a pleasant 88 y.o. African American female, in NAD. AAO x 3.   Vascular Capillary refill time to digits immediate b/l. Palpable DP pulse(s) b/l lower extremities. Palpable PT pulse(s) b/l lower extremities Pedal hair sparse. Lower extremity skin temperature gradient within normal limits. No cyanosis or clubbing noted.  Neurologic  Normal speech. Oriented to person, place, and time. Protective sensation intact 5/5 intact bilaterally with 10g monofilament b/l.  Dermatologic Pedal skin with normal turgor, texture and tone b/l lower extremities. No open wounds b/l lower extremities. No interdigital macerations b/l lower extremities. Toenails 1-5 b/l elongated, discolored, dystrophic, thickened, crumbly with subungual debris and tenderness to dorsal palpation. Hyperkeratotic lesion(s)  1st metatarsal head left foot, left hallux and posteromedial heel RLE.  No erythema, no edema, no drainage, no fluctuance. Porokeratotic lesion(s) submet head 1 right foot. No erythema, no edema, no drainage, no fluctuance.  Orthopedic: Normal muscle strength 5/5 to all lower extremity muscle groups bilaterally. Hallux valgus with bunion deformity noted b/l lower extremities. Hammertoe(s) noted to the 2-5 bilaterally. 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   Radiographs: None  Assessment/Plan: 1. Pain due to onychomycosis of toenails of both feet   2. Porokeratosis   3. Callus   4. Pain in both feet   -Patient was evaluated and treated. All patient's and/or POA's questions/concerns answered on today's visit. -Medicare ABN signed. Patient consents for services of paring of corn(s)/callus(es)/porokeratos(es) today. Copy in patient chart. -Toenails 1-5 b/l were debrided in length and girth with sterile nail nippers and dremel without iatrogenic bleeding.  -Callus(es) posteromedial aspect of right heel, left great toe, and submet head 1 left foot pared utilizing sterile scalpel blade without complication or incident. Total number debrided =3. -Porokeratotic lesion(s) submet head 1 right foot pared and enucleated with sterile scalpel blade without incident. Total number of lesions debrided=1. -Patient/POA to call should there be question/concern in the interim.   Return in about 3 months (around 07/08/2022).  Marzetta Board, DPM

## 2022-06-15 ENCOUNTER — Telehealth: Payer: Self-pay | Admitting: Internal Medicine

## 2022-06-15 NOTE — Telephone Encounter (Signed)
Pts daughter called to report that the pt has been having some increased Sob with exertion over the past 4-5 days. She can still lay flat and she is okay at rest but she is having peripheral edema up to both of her ankles..she denies chest pain, dizziness, palpitations... she has not had much added NA in her diet.   She is resting well today.. I made her an appt for tomorrow with an APP. Her daughter will call if anything changes or worsens... she will keep her feet elevated when siting.

## 2022-06-15 NOTE — Telephone Encounter (Signed)
Pt c/o Shortness Of Breath: STAT if SOB developed within the last 24 hours or pt is noticeably SOB on the phone  1. Are you currently SOB (can you hear that pt is SOB on the phone)?  No   2. How long have you been experiencing SOB?  Since Sunday, 7/16  3. Are you SOB when sitting or when up moving around?    4. Are you currently experiencing any other symptoms? Feels like something is rolling over in her chest    Pt c/o swelling: STAT is pt has developed SOB within 24 hours  How much weight have you gained and in what time span?  Unsure, patient's daughter states they have not been monitoring the patient's weights  If swelling, where is the swelling located?  Feet (mainly left)   Are you currently taking a fluid pill?  No   Are you currently SOB?  No   Do you have a log of your daily weights (if so, list)?   Have you gained 3 pounds in a day or 5 pounds in a week?   Have you traveled recently?

## 2022-06-15 NOTE — Progress Notes (Signed)
Cardiology Office Note:    Date:  06/17/2022   ID:  Tracy Faulkner, DOB 1932-10-27, MRN [id number]  PCP:  Sandrea Hughs, NP   [facility] Providers Cardiologist:  Sinclair Grooms, MD     Referring MD: Sandrea Hughs, NP   Chief Complaint: chest pain  History of Present Illness:    Tracy Faulkner is a 88 y.o. female with a hx of hypertension, palpitations, obesity, and frequent PACs.   She established care with Dr. Tamala Julian 12/31/2015 for evaluation of near syncope, recurring chest pain, and dyspnea.  She reported a prior episode where she nearly fainted while playing bingo, felt sick very weak, could not hold her head up.  She did not faint.  There was no chest pain or palpitation. Symptoms eventually resolved. She went to the ED but no diagnosis was made.  She had a second episode of dyspnea and fatigue that occurred during a church service.  Within 24 hours she went to the ED and was held for several hours while testing was performed but no diagnosis was made.  Additionally she was having recurring discomfort in the subxiphoid and subcostal region that radiates through to the back.  Can last up to 3 days.  It is not exertion related, no other associated symptoms. PVCs were identified on a 12 lead EKG for which she was asymptomatic.   Cardiac monitor 08/2019 revealed frequent PACs and brief runs of PSVT, no atrial fibrillation and no significant arrhythmia.   She was last seen in our office on 01/29/2021 by Dr. Harrington Challenger as a work in visit.  She reported shortness of breath and heart fluttering. She also experienced some chest discomfort, discomfort not associated with activity. Recent activity limitations and falling.  She was scheduled for echocardiogram which revealed normal LV EF 60 to 65%, mild LVH, G1 DD, no significant valve abnormality. Dr. Tamala Julian reported that her SOB may be 2/2 mild LVH and diastolic dysfunction. Cardiac monitor was ordered but never completed.  Her daughter called  our office on 06/15/2022 with reports of increased shortness of breath with exertion over the past 4 to 5 days.  She is able to lie flat and is okay at rest but having peripheral edema up to both of her ankles.  She denied increased sodium in diet and no chest pain.   Today, she is here with her daughter and is traveling in a wheelchair.  Much of the history is provided by the daughter.  The patient answers some questions but does not get complete history or specific symptomology.  Daughter reports that patient is having bilateral lower extremity edema and chest pain.  When asked about the chest pain, the patient points to her stomach and chest and reports "a feeling of pain traveling up like a bubble." Also reports that it bothers her to bend forward. She denies orthopnea, PND, palpitations. Can usually get herself dressed per daughter but required more assistance today.   Past Medical History:  Diagnosis Date   Anemia    Arthritis    Ataxia    Back problem    Bowel trouble    Breast cancer ([place])    Carpal tunnel syndrome    Constipation    Disturbance, sleep    Dyspnea    increased exertion    Frequent headaches    GI bleed    History of mammogram 2000   History of urinary tract infection    Hypertension    Obesity  Pain in both knees    Sepsis ([place])    Urinary incontinence     Past Surgical History:  Procedure Laterality Date   BREAST SURGERY     for cancer-right breast   COLON SURGERY     secondary to bowel blockages   EYE SURGERY     cataract surgery bilateral    gastric stapling     KNEE ARTHROPLASTY Left 10/07/2016   Procedure: LEFT TOTAL KNEE ARTHROPLASTY WITH COMPUTER NAVIGATION;  Surgeon: Rod Can, MD;  Location: [facility];  Service: Orthopedics;  Laterality: Left;    Current Medications: Current Meds  Medication Sig   acetaminophen (TYLENOL) 650 MG CR tablet Take 650 mg by mouth daily as needed for pain.   CALCIUM PO Take 1 capsule by mouth daily.    cycloSPORINE (RESTASIS) 0.05 % ophthalmic emulsion Place 1 drop into both eyes as needed.   furosemide (LASIX) 20 MG tablet Take 1 tablet (20 mg total) by mouth as directed. For swelling.   losartan (COZAAR) 50 MG tablet Take 1 tablet by mouth once daily   Multiple Vitamin (MULTIVITAMIN) tablet Take 1 tablet by mouth daily.   Omega-3 Fatty Acids (FISH OIL PO) Take 1 capsule by mouth daily.   omeprazole (PRILOSEC) 40 MG capsule TAKE 1 CAPSULE BY MOUTH ONCE DAILY BEFORE BREAKFAST   OVER THE COUNTER MEDICATION Take 1 capsule by mouth daily. Gluco Gel Joint & Cartilage Support   polyethylene glycol powder (GLYCOLAX/MIRALAX) 17 GM/SCOOP powder Take 17 g by mouth daily. Hold for loose stool   potassium chloride (KLOR-CON M) 10 MEQ tablet Take 1 tablet (10 mEq total) by mouth as directed. Take with lasix.     Allergies:   Ace inhibitors, Lortab [hydrocodone-acetaminophen], Remeron [mirtazapine], and Penicillins   Social History   Socioeconomic History   Marital status: Widowed    Spouse name: Not on file   Number of children: 3   Years of education: 8   Highest education level: Not on file  Occupational History   Occupation: unemployed  Tobacco Use   Smoking status: Never   Smokeless tobacco: Never  Vaping Use   Vaping Use: Never used  Substance and Sexual Activity   Alcohol use: No    Alcohol/week: 0.0 standard drinks of alcohol   Drug use: No   Sexual activity: Not Currently  Other Topics Concern   Not on file  Social History Narrative   Tobacco use, amount per day now: N/A   Past tobacco use, amount per day:   How many years did you use tobacco:   Alcohol use (drinks per week): N/A   Diet: Regular, Limited salt.   Do you drink/eat things with caffeine: Yes.   Marital status:  Widowed                                What year were you married?   Do you live in a house, apartment, assisted living, condo, trailer, etc.? House.   Is it one or more stories? Yes.   How many persons  live in your home? 4   Do you have pets in your home?( please list) No   Highest Level of education completed? 8th grade.   Current or past profession: Domestic, Environmental manager, Sports coach employed.   Do you exercise? Some  Type and how often?   Do you have a living will? No   Do you have a DNR form? No                                  If not, do you want to discuss one?   Do you have signed POA/HPOA forms? No                       If so, please bring to you appointment      Do you have any difficulty bathing or dressing yourself? No, minimal assistance.   Do you have any difficulty preparing food or eating? No, limited preparation.    Do you have any difficulty managing your medications? Yes, managed by family   Do you have any difficulty managing your finances? Yes, managed by family.   Do you have any difficulty affording your medications? No.   Social Determinants of Health   Financial Resource Strain: Not on file  Food Insecurity: Not on file  Transportation Needs: Not on file  Physical Activity: Not on file  Stress: Not on file  Social Connections: Not on file     Family History: The patient's family history includes Arthritis in her mother; Breast cancer in her mother; Diabetes Mellitus II in her mother; Heart disease in her father; Hypertension in her mother.  ROS:   Please see the history of present illness.    + leg edema + chest/abdominal pain All other systems reviewed and are negative.  Labs/Other Studies Reviewed:    The following studies were reviewed today:  Cardiac monitor 08/2019  Basic rhythm is NSR 17 episodes of SVT, longest 15 beats and fastest 134 bpm Rare PVC's Triggered event PAC Isolated PAC's 9.5% No atrial fibrillation   Echo 02/27/2021   1. Left ventricular ejection fraction, by estimation, is 60 to 65%. The  left ventricle has normal function. The left ventricle has no regional  wall motion abnormalities. There is mild  left ventricular hypertrophy.  Left ventricular diastolic parameters  are consistent with Grade I diastolic dysfunction (impaired relaxation).  Elevated left atrial pressure.   2. Right ventricular systolic function is normal. The right ventricular  size is normal.   3. Left atrial size was mildly dilated.   4. The mitral valve is normal in structure. Trivial mitral valve  regurgitation. No evidence of mitral stenosis.   5. The aortic valve is tricuspid. Aortic valve regurgitation is not  visualized. Mild to moderate aortic valve sclerosis/calcification is  present, without any evidence of aortic stenosis.   6. The inferior vena cava is normal in size with greater than 50%  respiratory variability, suggesting right atrial pressure of 3 mmHg.     Recent Labs: 03/08/2022: ALT 19; Hemoglobin 10.4; Platelets 144; TSH 1.00 06/16/2022: BUN 15; Creatinine, Ser 1.03; Potassium 4.1; Sodium 142  Recent Lipid Panel    Component Value Date/Time   CHOL 195 03/08/2022 1146   TRIG 57 03/08/2022 1146   HDL 59 03/08/2022 1146   CHOLHDL 3.3 03/08/2022 1146   LDLCALC 122 (H) 03/08/2022 1146     Risk Assessment/Calculations:      Physical Exam:    VS:  BP 140/80   Pulse 63   Ht 5' 6.5" (1.689 m)   Wt 155 lb (70.3 kg)   SpO2 99%   BMI 24.64 kg/m     Wt  Readings from Last 3 Encounters:  06/16/22 155 lb (70.3 kg)  03/12/22 161 lb 6.4 oz (73.2 kg)  11/11/21 162 lb (73.5 kg)     GEN:  Well nourished, chronically ill appearing no acute distress HEENT: Normal NECK: No JVD; No carotid bruits CARDIAC: RRR, no murmurs, rubs, gallops RESPIRATORY:  Clear to auscultation without rales, wheezing or rhonchi  ABDOMEN: Soft, non-tender, non-distended MUSCULOSKELETAL:  2+ pitting edema bilateral LE. No deformity. 2+ pedal pulses, equal bilaterally SKIN: Warm and dry NEUROLOGIC:  Alert. Answers some questions appropriately otherwise despondent PSYCHIATRIC: Flat affect  EKG:  EKG is not ordered  today.   Diagnoses:    1. Essential hypertension   2. Palpitations   3. Leg swelling   4. Chest pain of uncertain etiology   5. Chronic heart failure with preserved ejection fraction (HFpEF) (HCC)    Assessment and Plan:     Leg swelling: 2+ pitting edema bilateral lower extremities extending up to mid calf.  Daughter states they lost the prescription for TED hose and lift chair. Prescriptions for both of those provided today with the understanding that further orders for lift chair would need to be provided by PCP. We will start Lasix 20 mg and Kdur 10 mEq once daily x 3 days then daily as needed.  Advised that if Lasix is taken on a consistent basis, would recommend recheck of basic metabolic panel in 2 weeks.  We will check bmet today to ensure stable kidney function and electrolytes. Leg elevation, compression, low sodium diet recommended.    Chest pain: She describes what feels like a bubble traveling in her chest as well as discomfort bending forward. Is not very active.  Will trial diuretic therapy as noted above to see if symptoms improve. Echo 02/2021 did not reveal significant valve disease or wall motion abnormalities. Would favor medical management due to not a likely candidate for invasive procedures.   Chronic HFpEF: LVEF 60 to 65%, mild LVH, G1 DD on echo 02/2021. She is having significant leg swelling. Also reports bendopnea. We will trial diuretic therapy and have asked her daughter to notify us if there is no improvement.  Hypertension: BP mildly elevated today. With addition of Lasix, would favor conservative management at this time due to high risk for falls.    Disposition: 3 months with Dr. Tamala Julian or APP    Medication Adjustments/Labs and Tests Ordered: Current medicines are reviewed at length with the patient today.  Concerns regarding medicines are outlined above.  Orders Placed This Encounter  Procedures   Basic Metabolic Panel (BMET)   EKG 12-Lead   Meds ordered  this encounter  Medications   furosemide (LASIX) 20 MG tablet    Sig: Take 1 tablet (20 mg total) by mouth as directed. For swelling.    Dispense:  30 tablet    Refill:  6   potassium chloride (KLOR-CON M) 10 MEQ tablet    Sig: Take 1 tablet (10 mEq total) by mouth as directed. Take with lasix.    Dispense:  30 tablet    Refill:  6    Patient Instructions  Medication Instructions:   START Lasix one tablet by mouth (20 mg) daily X 3 days than just for swelling.   START K-Dur one tablet by mouth ( 10 mEq) daily X 3 days than just for swelling.   *If you need a refill on your cardiac medications before your next appointment, please call your pharmacy*   Lab Work:  TODAY!!!!  BMET  If you have labs (blood work) drawn today and your tests are completely normal, you will receive your results only by: [place] (if you have MyChart) OR A paper copy in the mail If you have any lab test that is abnormal or we need to change your treatment, we will call you to review the results.   Testing/Procedures:   None ordered.   Follow-Up: At [facility], you and your health needs are our priority.  As part of our continuing mission to provide you with exceptional heart care, we have created designated Provider Care Teams.  These Care Teams include your primary Cardiologist (physician) and Advanced Practice Providers (APPs -  Physician Assistants and Nurse Practitioners) who all work together to provide you with the care you need, when you need it.  We recommend signing up for the patient portal called "MyChart".  Sign up information is provided on this After Visit Summary.  MyChart is used to connect with patients for Virtual Visits (Telemedicine).  Patients are able to view lab/test results, encounter notes, upcoming appointments, etc.  Non-urgent messages can be sent to your provider as well.   To learn more about what you can do with MyChart, go to [email or web address].    Your  next appointment:   3 month(s)  The format for your next appointment:   In Person  Provider:   Sinclair Grooms, MD     Other Instructions  Patient was given script today for compression hose and Lift chair.   Important Information About Sugar         Signed, Emmaline Life, NP  06/17/2022 8:47 AM    [place]

## 2022-06-16 ENCOUNTER — Ambulatory Visit: Payer: Medicare PPO | Admitting: Nurse Practitioner

## 2022-06-16 ENCOUNTER — Encounter: Payer: Self-pay | Admitting: Nurse Practitioner

## 2022-06-16 VITALS — BP 140/80 | HR 63 | Ht 66.5 in | Wt 155.0 lb

## 2022-06-16 DIAGNOSIS — R0602 Shortness of breath: Secondary | ICD-10-CM | POA: Diagnosis not present

## 2022-06-16 DIAGNOSIS — R609 Edema, unspecified: Secondary | ICD-10-CM | POA: Diagnosis not present

## 2022-06-16 DIAGNOSIS — R079 Chest pain, unspecified: Secondary | ICD-10-CM

## 2022-06-16 DIAGNOSIS — R002 Palpitations: Secondary | ICD-10-CM

## 2022-06-16 DIAGNOSIS — I10 Essential (primary) hypertension: Secondary | ICD-10-CM | POA: Diagnosis not present

## 2022-06-16 DIAGNOSIS — I5032 Chronic diastolic (congestive) heart failure: Secondary | ICD-10-CM | POA: Diagnosis not present

## 2022-06-16 DIAGNOSIS — M7989 Other specified soft tissue disorders: Secondary | ICD-10-CM

## 2022-06-16 LAB — BASIC METABOLIC PANEL
BUN/Creatinine Ratio: 15 (ref 12–28)
BUN: 15 mg/dL (ref 8–27)
CO2: 25 mmol/L (ref 20–29)
Calcium: 9.5 mg/dL (ref 8.7–10.3)
Chloride: 107 mmol/L — ABNORMAL HIGH (ref 96–106)
Creatinine, Ser: 1.03 mg/dL — ABNORMAL HIGH (ref 0.57–1.00)
Glucose: 84 mg/dL (ref 70–99)
Potassium: 4.1 mmol/L (ref 3.5–5.2)
Sodium: 142 mmol/L (ref 134–144)
eGFR: 52 mL/min/{1.73_m2} — ABNORMAL LOW (ref >59)

## 2022-06-16 MED ORDER — POTASSIUM CHLORIDE CRYS ER 10 MEQ PO TBCR: 10.0000 meq | EXTENDED_RELEASE_TABLET | ORAL | 6 refills | Status: DC

## 2022-06-16 MED ORDER — FUROSEMIDE 20 MG PO TABS: 20.0000 mg | ORAL_TABLET | ORAL | 6 refills | Status: DC

## 2022-06-16 NOTE — Patient Instructions (Signed)
Medication Instructions:   START Lasix one tablet by mouth (20 mg) daily X 3 days than just for swelling.   START K-Dur one tablet by mouth ( 10 mEq) daily X 3 days than just for swelling.   *If you need a refill on your cardiac medications before your next appointment, please call your pharmacy*   Lab Work:  TODAY!!!! BMET  If you have labs (blood work) drawn today and your tests are completely normal, you will receive your results only by: [place] (if you have MyChart) OR A paper copy in the mail If you have any lab test that is abnormal or we need to change your treatment, we will call you to review the results.   Testing/Procedures:   None ordered.   Follow-Up: At [facility], you and your health needs are our priority.  As part of our continuing mission to provide you with exceptional heart care, we have created designated Provider Care Teams.  These Care Teams include your primary Cardiologist (physician) and Advanced Practice Providers (APPs -  Physician Assistants and Nurse Practitioners) who all work together to provide you with the care you need, when you need it.  We recommend signing up for the patient portal called "MyChart".  Sign up information is provided on this After Visit Summary.  MyChart is used to connect with patients for Virtual Visits (Telemedicine).  Patients are able to view lab/test results, encounter notes, upcoming appointments, etc.  Non-urgent messages can be sent to your provider as well.   To learn more about what you can do with MyChart, go to [email or web address].    Your next appointment:   3 month(s)  The format for your next appointment:   In Person  Provider:   Sinclair Grooms, MD     Other Instructions  Patient was given script today for compression hose and Lift chair.   Important Information About Sugar

## 2022-06-17 ENCOUNTER — Encounter: Payer: Self-pay | Admitting: Nurse Practitioner

## 2022-07-13 ENCOUNTER — Ambulatory Visit: Payer: Medicare PPO | Admitting: Podiatry

## 2022-07-13 ENCOUNTER — Encounter: Payer: Self-pay | Admitting: Podiatry

## 2022-07-13 DIAGNOSIS — M79672 Pain in left foot: Secondary | ICD-10-CM

## 2022-07-13 DIAGNOSIS — M79675 Pain in left toe(s): Secondary | ICD-10-CM

## 2022-07-13 DIAGNOSIS — M79674 Pain in right toe(s): Secondary | ICD-10-CM | POA: Diagnosis not present

## 2022-07-13 DIAGNOSIS — Q828 Other specified congenital malformations of skin: Secondary | ICD-10-CM | POA: Diagnosis not present

## 2022-07-13 DIAGNOSIS — B351 Tinea unguium: Secondary | ICD-10-CM | POA: Diagnosis not present

## 2022-07-13 DIAGNOSIS — M79671 Pain in right foot: Secondary | ICD-10-CM | POA: Diagnosis not present

## 2022-07-13 DIAGNOSIS — L84 Corns and callosities: Secondary | ICD-10-CM

## 2022-07-18 NOTE — Progress Notes (Signed)
  Subjective:  Patient ID: Tracy Faulkner, female    DOB: 09-07-1932,  MRN: [id number]  Tracy Faulkner presents to clinic today for callus(es) b/l lower extremities, porokeratotic lesion(s) right lower extremity, and painful mycotic nails. Painful toenails interfere with ambulation. Aggravating factors include wearing enclosed shoe gear. Pain is relieved with periodic professional debridement. Painful callus(es) and porokeratotic lesion(s) are aggravated when weightbearing with and without shoegear. Pain is relieved with periodic professional debridement.  Patient is accompanied by her daughter on today's visit.  New problem(s): None.   PCP is Ngetich, Nelda Bucks, NP , and last visit was  March 12, 2022  Allergies  Allergen Reactions   Ace Inhibitors Cough   Lortab [Hydrocodone-Acetaminophen]     Bad dreams   Remeron [Mirtazapine] Other (See Comments)    Hallucinations (can take 1/2 tablet--7.5 mg), not 15 mg   Penicillins Rash and Other (See Comments)    Throat swelling, Has patient had a PCN reaction causing immediate rash, facial/tongue/throat swelling, SOB or lightheadedness with hypotension: Yes Has patient had a PCN reaction causing severe rash involving mucus membranes or skin necrosis: No Has patient had a PCN reaction that required hospitalization No Has patient had a PCN reaction occurring within the last 10 years: No If all of the above answers are "NO", then may proceed with Cephalosporin use.    Review of Systems: Negative except as noted in the HPI.  Objective: No changes noted in today's physical examination. Constitutional Tracy Faulkner is a pleasant 88 y.o. African American female, in NAD. AAO x 3.   Vascular Capillary refill time to digits immediate b/l. Palpable DP pulse(s) b/l lower extremities. Palpable PT pulse(s) b/l lower extremities Pedal hair sparse. Lower extremity skin temperature gradient within normal limits. No cyanosis or clubbing noted.  Neurologic Normal  speech. Oriented to person, place, and time. Protective sensation intact 5/5 intact bilaterally with 10g monofilament b/l.  Dermatologic Pedal skin with normal turgor, texture and tone b/l lower extremities. No open wounds b/l lower extremities. No interdigital macerations b/l lower extremities. Toenails 1-5 b/l elongated, discolored, dystrophic, thickened, crumbly with subungual debris and tenderness to dorsal palpation. Hyperkeratotic lesion(s)  1st metatarsal head left foot and posteromedial heel RLE.  No erythema, no edema, no drainage, no fluctuance. Porokeratotic lesion(s) submet head 1 right foot. No erythema, no edema, no drainage, no fluctuance.  Orthopedic: Normal muscle strength 5/5 to all lower extremity muscle groups bilaterally. Hallux valgus with bunion deformity noted b/l lower extremities. Hammertoe(s) noted to the 2-5 bilaterally. 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   Radiographs: None  Assessment/Plan: 1. Pain due to onychomycosis of toenails of both feet   2. Porokeratosis   3. Callus   4. Pain in both feet   -Patient's family member present. All questions/concerns addressed on today's visit. -Examined patient. -Medicare ABN signed for services of paring of corn(s)/callus(es)/porokeratos(es). Copy in patient chart. -Mycotic toenails 1-5 bilaterally were debrided in length and girth with sterile nail nippers and dremel without incident. -Callus(es) posteromedial aspect of right heel and submet head 1 left foot pared utilizing sterile scalpel blade without complication or incident. Total number debrided =2. -Porokeratotic lesion(s) submet head 1 right foot pared and enucleated with sterile scalpel blade without incident. Total number of lesions debrided=1. -Patient/POA to call should there be question/concern in the interim.   Return in about 3 months (around 10/13/2022).  Marzetta Board, DPM

## 2022-07-21 ENCOUNTER — Ambulatory Visit: Payer: Medicare PPO | Admitting: Family

## 2022-07-21 ENCOUNTER — Encounter: Payer: Self-pay | Admitting: Family

## 2022-07-21 ENCOUNTER — Ambulatory Visit
Admission: RE | Admit: 2022-07-21 | Discharge: 2022-07-21 | Disposition: A | Discharge status: Home / Self Care | Payer: Medicare PPO | Source: Ambulatory Visit | Attending: Family | Admitting: Family

## 2022-07-21 VITALS — BP 140/70 | HR 62 | Temp 97.5°F | Resp 18 | Ht 64.5 in | Wt 155.0 lb

## 2022-07-21 DIAGNOSIS — G8929 Other chronic pain: Secondary | ICD-10-CM | POA: Diagnosis not present

## 2022-07-21 DIAGNOSIS — M25561 Pain in right knee: Secondary | ICD-10-CM

## 2022-07-21 DIAGNOSIS — R600 Localized edema: Secondary | ICD-10-CM | POA: Diagnosis not present

## 2022-07-21 DIAGNOSIS — M25461 Effusion, right knee: Secondary | ICD-10-CM | POA: Diagnosis not present

## 2022-07-21 NOTE — Patient Instructions (Signed)
-   Please get right knee  X-ray at [facility] then will call you with results.

## 2022-07-21 NOTE — Progress Notes (Signed)
Provider: Marlowe Sax FNP-C  Sebastiana Wuest, Nelda Bucks, NP  Patient Care Team: Jaylee Freeze, Nelda Bucks, NP as PCP - General (Family Medicine) Belva Crome, MD as PCP - Cardiology (Cardiology) Belva Crome, MD as Consulting Physician (Cardiology)  Extended Emergency Contact Information Primary Emergency Contact: [place] Phone: [phone] Mobile Phone: [phone] Relation: Daughter  Code Status:  Full Code  Goals of care: Advanced Directive information    03/12/2022   10:27 AM  Advanced Directives  Does Patient Have a Medical Advance Directive? No  Would patient like information on creating a medical advance directive? No - Patient declined     Chief Complaint  Patient presents with   Acute Visit    Patient is here for abdominal pain but still able to eat well. Patient is having generalized weakness and also knee pain, hard to move knees keep giving way.    HPI:  Pt is a 88 y.o. female seen today for an acute visit for evaluation of knee pain worsening.states knee giving out when walking.  Has taken over-the-counter analgesic.  Denies any weakness or numbness or tingling on the leg.  No injuries to knee    Past Medical History:  Diagnosis Date   Anemia    Arthritis    Ataxia    Back problem    Bowel trouble    Breast cancer ([place])    Carpal tunnel syndrome    Constipation    Disturbance, sleep    Dyspnea    increased exertion    Frequent headaches    GI bleed    History of mammogram 2000   History of urinary tract infection    Hypertension    Obesity    Pain in both knees    Sepsis ([place])    Urinary incontinence    Past Surgical History:  Procedure Laterality Date   BREAST SURGERY     for cancer-right breast   COLON SURGERY     secondary to bowel blockages   EYE SURGERY     cataract surgery bilateral    gastric stapling     KNEE ARTHROPLASTY Left 10/07/2016   Procedure: LEFT TOTAL KNEE ARTHROPLASTY WITH COMPUTER  NAVIGATION;  Surgeon: Rod Can, MD;  Location: [facility];  Service: Orthopedics;  Laterality: Left;    Allergies  Allergen Reactions   Ace Inhibitors Cough   Lortab [Hydrocodone-Acetaminophen]     Bad dreams   Remeron [Mirtazapine] Other (See Comments)    Hallucinations (can take 1/2 tablet--7.5 mg), not 15 mg   Penicillins Rash and Other (See Comments)    Throat swelling, Has patient had a PCN reaction causing immediate rash, facial/tongue/throat swelling, SOB or lightheadedness with hypotension: Yes Has patient had a PCN reaction causing severe rash involving mucus membranes or skin necrosis: No Has patient had a PCN reaction that required hospitalization No Has patient had a PCN reaction occurring within the last 10 years: No If all of the above answers are "NO", then may proceed with Cephalosporin use.     Outpatient Encounter Medications as of 07/21/2022  Medication Sig   acetaminophen (TYLENOL) 650 MG CR tablet Take 650 mg by mouth daily as needed for pain.   CALCIUM PO Take 1 capsule by mouth daily.   cycloSPORINE (RESTASIS) 0.05 % ophthalmic emulsion Place 1 drop into both eyes as needed.   furosemide (LASIX) 20 MG tablet Take 1 tablet (20 mg total) by mouth as directed. For swelling.   losartan (  COZAAR) 50 MG tablet Take 1 tablet by mouth once daily   Multiple Vitamin (MULTIVITAMIN) tablet Take 1 tablet by mouth daily.   Omega-3 Fatty Acids (FISH OIL PO) Take 1 capsule by mouth daily.   omeprazole (PRILOSEC) 40 MG capsule TAKE 1 CAPSULE BY MOUTH ONCE DAILY BEFORE BREAKFAST   OVER THE COUNTER MEDICATION Take 1 capsule by mouth daily. Gluco Gel Joint & Cartilage Support   polyethylene glycol powder (GLYCOLAX/MIRALAX) 17 GM/SCOOP powder Take 17 g by mouth daily. Hold for loose stool   potassium chloride (KLOR-CON M) 10 MEQ tablet Take 1 tablet (10 mEq total) by mouth as directed. Take with lasix.   No facility-administered encounter medications on file as of 07/21/2022.     Review of Systems  Constitutional:  Negative for chills, fatigue and fever.  Musculoskeletal:  Positive for arthralgias, gait problem and joint swelling. Negative for myalgias and neck pain.       Right Knee pain   Skin:  Negative for color change, pallor and rash.  Neurological:  Negative for weakness and numbness.  Psychiatric/Behavioral:  Negative for agitation and confusion.     Immunization History  Administered Date(s) Administered   Fluad Quad(high Dose 65+) 09/07/2021   Influenza, High Dose Seasonal PF 08/13/2019   Influenza,inj,quad, With Preservative 09/15/2017   Janssen (J&J) SARS-COV-2 Vaccination 02/03/2020   Pneumococcal Conjugate-13 08/18/2015   Pneumococcal Polysaccharide-23 12/13/2017   Tdap 10/11/2017   Zoster Recombinat (Shingrix) 10/11/2017, 12/13/2017   Zoster, Live 10/12/2017   Pertinent  Health Maintenance Due  Topic Date Due   DEXA SCAN  Never done   INFLUENZA VACCINE  06/29/2022      04/23/2021    3:07 PM 06/15/2021    2:35 PM 08/12/2021    8:38 AM 09/07/2021    9:23 AM 03/12/2022   10:26 AM  Fall Risk  Falls in the past year?  '1 1 1 1  '$ Was there an injury with Fall? 0 1 0 0 0  Fall Risk Category Calculator  '2 2 2 1  '$ Fall Risk Category  Moderate Moderate Moderate Low  Patient Fall Risk Level Low fall risk Moderate fall risk Moderate fall risk Moderate fall risk Low fall risk  Patient at Risk for Falls Due to  History of fall(s) History of fall(s) History of fall(s) History of fall(s)  Fall risk Follow up  Falls evaluation completed Falls evaluation completed Falls evaluation completed Falls evaluation completed;Education provided;Falls prevention discussed   Functional Status Survey:    Vitals:   07/21/22 0857  BP: (!) 140/70  Pulse: 62  Resp: 18  Temp: (!) 97.5 F (36.4 C)  TempSrc: Temporal  SpO2: 99%  Weight: 155 lb (70.3 kg)  Height: 5' 4.5" (1.638 m)   Body mass index is 26.19 kg/m. Physical Exam Constitutional:       General: She is not in acute distress.    Appearance: She is overweight. She is not ill-appearing.  Cardiovascular:     Rate and Rhythm: Normal rate and regular rhythm.     Pulses: Normal pulses.     Heart sounds: Normal heart sounds. No murmur heard.    No friction rub. No gallop.  Pulmonary:     Effort: Pulmonary effort is normal. No respiratory distress.     Breath sounds: Normal breath sounds. No wheezing, rhonchi or rales.  Chest:     Chest wall: No tenderness.  Musculoskeletal:     Right knee: Deformity present. No swelling, effusion, erythema or ecchymosis. Decreased  range of motion.     Left knee: No swelling, effusion, erythema or ecchymosis. Normal range of motion. No tenderness.     Right lower leg: Edema present.     Left lower leg: Edema present.  Skin:    General: Skin is warm and dry.     Coloration: Skin is not pale.     Findings: No bruising, erythema, lesion or rash.  Neurological:     Mental Status: She is alert. Mental status is at baseline.     Cranial Nerves: No cranial nerve deficit.     Sensory: No sensory deficit.     Motor: No weakness.     Coordination: Coordination normal.     Gait: Gait abnormal.  Psychiatric:        Mood and Affect: Mood normal.        Behavior: Behavior normal.     Labs reviewed: Recent Labs    09/04/21 0837 03/08/22 1146 06/16/22 1548  NA 141 143 142  K 3.7 4.3 4.1  CL 109 112* 107*  CO2 '26 25 25  '$ GLUCOSE 92 88 84  BUN '15 25 15  '$ CREATININE 1.02* 1.05* 1.03*  CALCIUM 9.5 9.5 9.5   Recent Labs    09/04/21 0837 03/08/22 1146  AST 26 26  ALT 16 19  BILITOT 0.8 0.7  PROT 6.7 7.0   Recent Labs    09/04/21 0837 11/11/21 1026 03/08/22 1146  WBC 4.1 4.5 4.5  NEUTROABS 2,300 2.8 2,660  HGB 10.2* 10.8* 10.4*  HCT 33.2* 33.8* 33.3*  MCV 81.8 78.3 81.4  PLT 182 174.0 144   Lab Results  Component Value Date   TSH 1.00 03/08/2022   No results found for: "HGBA1C" Lab Results  Component Value Date   CHOL 195  03/08/2022   HDL 59 03/08/2022   LDLCALC 122 (H) 03/08/2022   TRIG 57 03/08/2022   CHOLHDL 3.3 03/08/2022    Significant Diagnostic Results in last 30 days:  No results found.  Assessment/Plan 1. Chronic pain of right knee Worsening knee pain  - continuing on current analgesic  - DG Knee Complete 4 Views Right; Future - Ambulatory referral to Orthopedic Surgery  2. Edema of both lower extremities No signs of fluid overload  Advised to wear knee high compression stockings on and off at bedtime  - Keep legs elevated when seated at home   Family/ staff Communication: Reviewed plan of care with patient and daughter verbalized understanding   Labs/tests ordered:  - DG Knee Complete 4 Views Right; Future  Next Appointment: Return if symptoms worsen or fail to improve.   Sandrea Hughs, NP

## 2022-07-28 ENCOUNTER — Encounter: Payer: Medicare PPO | Admitting: Family

## 2022-07-28 ENCOUNTER — Encounter: Payer: Self-pay | Admitting: Family

## 2022-07-28 ENCOUNTER — Other Ambulatory Visit: Payer: Self-pay

## 2022-07-28 ENCOUNTER — Ambulatory Visit
Admission: RE | Admit: 2022-07-28 | Discharge: 2022-07-28 | Disposition: A | Discharge status: Home / Self Care | Payer: Medicare PPO | Source: Ambulatory Visit | Attending: Family | Admitting: Family

## 2022-07-28 ENCOUNTER — Other Ambulatory Visit: Payer: Self-pay | Admitting: Family

## 2022-07-28 ENCOUNTER — Ambulatory Visit: Payer: Medicare PPO | Admitting: Family

## 2022-07-28 VITALS — BP 118/80 | HR 62 | Temp 98.1°F | Resp 16 | Ht 64.5 in | Wt 155.0 lb

## 2022-07-28 DIAGNOSIS — R1031 Right lower quadrant pain: Secondary | ICD-10-CM | POA: Diagnosis not present

## 2022-07-28 DIAGNOSIS — W19XXXA Unspecified fall, initial encounter: Secondary | ICD-10-CM

## 2022-07-28 DIAGNOSIS — Y92009 Unspecified place in unspecified non-institutional (private) residence as the place of occurrence of the external cause: Secondary | ICD-10-CM

## 2022-07-28 DIAGNOSIS — G8929 Other chronic pain: Secondary | ICD-10-CM | POA: Diagnosis not present

## 2022-07-28 DIAGNOSIS — M25561 Pain in right knee: Secondary | ICD-10-CM | POA: Diagnosis not present

## 2022-07-28 DIAGNOSIS — R350 Frequency of micturition: Secondary | ICD-10-CM

## 2022-07-28 DIAGNOSIS — M79651 Pain in right thigh: Secondary | ICD-10-CM

## 2022-07-28 DIAGNOSIS — I10 Essential (primary) hypertension: Secondary | ICD-10-CM | POA: Diagnosis not present

## 2022-07-28 DIAGNOSIS — R102 Pelvic and perineal pain: Secondary | ICD-10-CM | POA: Diagnosis not present

## 2022-07-28 NOTE — Progress Notes (Signed)
Provider: Marlowe Sax FNP-C  Mischa Pollard, Nelda Bucks, NP  Patient Care Team: Trianna Lupien, Nelda Bucks, NP as PCP - General (Family Medicine) Belva Crome, MD as PCP - Cardiology (Cardiology) Belva Crome, MD as Consulting Physician (Cardiology)  Extended Emergency Contact Information Primary Emergency Contact: [place] Phone: [phone] Mobile Phone: [phone] Relation: Daughter  Code Status:  Full Code  Goals of care: Advanced Directive information    07/28/2022    2:26 PM  Advanced Directives  Does Patient Have a Medical Advance Directive? No  Would patient like information on creating a medical advance directive? No - Patient declined     Chief Complaint  Patient presents with   Acute Visit    Patient complains of having a fall last night 07/27/2022. Resulting in a lot of right leg pain.   HIGH FALL RISK     FALL RISK IS HIGH    HPI:  Pt is a 88 y.o. female seen today for an acute visit for evaluation of fall x 2 episode in her bed room.states hit her right side of the head on the floor.Also complains of right groin,thigh and knee pain.Having difficulties walking. No loss of consciousness.she was assisted out of the floor by granddaughter and second fall episode by her daughter. HPI limited due to patient's cognition.  She denies any fever,chills,cough,fatigue,body aches,runny nose,chest tightness,chest pain,palpitation or shortness of breath. No increased confusion reported.  Appetite is good.   Past Medical History:  Diagnosis Date   Anemia    Arthritis    Ataxia    Back problem    Bowel trouble    Breast cancer ([place])    Carpal tunnel syndrome    Constipation    Disturbance, sleep    Dyspnea    increased exertion    Frequent headaches    GI bleed    History of mammogram 2000   History of urinary tract infection    Hypertension    Obesity    Pain in both knees    Sepsis ([place])    Urinary incontinence    Past Surgical  History:  Procedure Laterality Date   BREAST SURGERY     for cancer-right breast   COLON SURGERY     secondary to bowel blockages   EYE SURGERY     cataract surgery bilateral    gastric stapling     KNEE ARTHROPLASTY Left 10/07/2016   Procedure: LEFT TOTAL KNEE ARTHROPLASTY WITH COMPUTER NAVIGATION;  Surgeon: Rod Can, MD;  Location: [facility];  Service: Orthopedics;  Laterality: Left;    Allergies  Allergen Reactions   Ace Inhibitors Cough   Lortab [Hydrocodone-Acetaminophen]     Bad dreams   Remeron [Mirtazapine] Other (See Comments)    Hallucinations (can take 1/2 tablet--7.5 mg), not 15 mg   Penicillins Rash and Other (See Comments)    Throat swelling, Has patient had a PCN reaction causing immediate rash, facial/tongue/throat swelling, SOB or lightheadedness with hypotension: Yes Has patient had a PCN reaction causing severe rash involving mucus membranes or skin necrosis: No Has patient had a PCN reaction that required hospitalization No Has patient had a PCN reaction occurring within the last 10 years: No If all of the above answers are "NO", then may proceed with Cephalosporin use.     Outpatient Encounter Medications as of 07/28/2022  Medication Sig   acetaminophen (TYLENOL) 650 MG CR tablet Take 650 mg by mouth daily as needed for pain.  CALCIUM PO Take 1 capsule by mouth daily.   cycloSPORINE (RESTASIS) 0.05 % ophthalmic emulsion Place 1 drop into both eyes as needed.   losartan (COZAAR) 50 MG tablet Take 1 tablet by mouth once daily   Multiple Vitamin (MULTIVITAMIN) tablet Take 1 tablet by mouth daily.   Omega-3 Fatty Acids (FISH OIL PO) Take 1 capsule by mouth daily.   omeprazole (PRILOSEC) 40 MG capsule TAKE 1 CAPSULE BY MOUTH ONCE DAILY BEFORE BREAKFAST   OVER THE COUNTER MEDICATION Take 1 capsule by mouth daily. Gluco Gel Joint & Cartilage Support   polyethylene glycol (MIRALAX) 17 g packet Take 17 g by mouth as needed.   [DISCONTINUED] furosemide (LASIX) 20  MG tablet Take 1 tablet (20 mg total) by mouth as directed. For swelling.   [DISCONTINUED] polyethylene glycol powder (GLYCOLAX/MIRALAX) 17 GM/SCOOP powder Take 17 g by mouth daily. Hold for loose stool   [DISCONTINUED] potassium chloride (KLOR-CON M) 10 MEQ tablet Take 1 tablet (10 mEq total) by mouth as directed. Take with lasix.   No facility-administered encounter medications on file as of 07/28/2022.    Review of Systems  Constitutional:  Negative for appetite change, chills, fatigue, fever and unexpected weight change.  HENT:  Negative for congestion, hearing loss, nosebleeds, postnasal drip, rhinorrhea, sinus pressure, sinus pain, sneezing, sore throat, tinnitus and trouble swallowing.   Eyes:  Negative for pain, discharge, redness, itching and visual disturbance.  Respiratory:  Negative for cough, chest tightness, shortness of breath and wheezing.   Cardiovascular:  Negative for chest pain, palpitations and leg swelling.  Gastrointestinal:  Negative for abdominal distention, abdominal pain, blood in stool, diarrhea, nausea and vomiting.  Genitourinary:  Negative for difficulty urinating, dysuria, flank pain, frequency and urgency.       Incontinent wears adult size pull ups   Musculoskeletal:  Positive for arthralgias and gait problem. Negative for back pain, joint swelling, myalgias, neck pain and neck stiffness.       Right groin,thigh and knee pain   Skin:  Negative for color change, pallor, rash and wound.       Right temporal head without any bruise or swelling noted   Neurological:  Negative for dizziness, syncope, speech difficulty, weakness, light-headedness and headaches.  Hematological:  Does not bruise/bleed easily.  Psychiatric/Behavioral:  Negative for agitation, behavioral problems, confusion, hallucinations and sleep disturbance. The patient is not nervous/anxious.     Immunization History  Administered Date(s) Administered   Fluad Quad(high Dose 65+) 09/07/2021    Influenza, High Dose Seasonal PF 08/13/2019   Influenza,inj,quad, With Preservative 09/15/2017   Janssen (J&J) SARS-COV-2 Vaccination 02/03/2020   Pneumococcal Conjugate-13 08/18/2015   Pneumococcal Polysaccharide-23 12/13/2017   Tdap 10/11/2017   Zoster Recombinat (Shingrix) 10/11/2017, 12/13/2017   Zoster, Live 10/12/2017   Pertinent  Health Maintenance Due  Topic Date Due   DEXA SCAN  Never done   INFLUENZA VACCINE  06/29/2022      06/15/2021    2:35 PM 08/12/2021    8:38 AM 09/07/2021    9:23 AM 03/12/2022   10:26 AM 07/28/2022    2:25 PM  Fall Risk  Falls in the past year? 1 1 1 1 1   Was there an injury with Fall? 1 0 0 0 1  Fall Risk Category Calculator 2 2 2 1 3   Fall Risk Category Moderate Moderate Moderate Low High  Patient Fall Risk Level Moderate fall risk Moderate fall risk Moderate fall risk Low fall risk High fall risk  Patient at  Risk for Falls Due to History of fall(s) History of fall(s) History of fall(s) History of fall(s) History of fall(s);Impaired balance/gait;Impaired mobility  Fall risk Follow up Falls evaluation completed Falls evaluation completed Falls evaluation completed Falls evaluation completed;Education provided;Falls prevention discussed Falls evaluation completed;Education provided;Falls prevention discussed   Functional Status Survey:    Vitals:   07/28/22 1402  BP: 118/80  Pulse: 62  Resp: 16  Temp: 98.1 F (36.7 C)  SpO2: 92%  Weight: 155 lb (70.3 kg)  Height: 5' 4.5" (1.638 m)   Body mass index is 26.19 kg/m. Physical Exam Vitals reviewed.  Constitutional:      General: She is not in acute distress.    Appearance: Normal appearance. She is overweight. She is not ill-appearing or diaphoretic.  HENT:     Head: Normocephalic.     Nose: No congestion or rhinorrhea.     Mouth/Throat:     Mouth: Mucous membranes are moist.     Pharynx: Oropharynx is clear. No oropharyngeal exudate or posterior oropharyngeal erythema.  Eyes:      General: No scleral icterus.       Right eye: No discharge.        Left eye: No discharge.     Extraocular Movements: Extraocular movements intact.     Conjunctiva/sclera: Conjunctivae normal.     Pupils: Pupils are equal, round, and reactive to light.  Neck:     Vascular: No carotid bruit.  Cardiovascular:     Rate and Rhythm: Normal rate and regular rhythm.     Pulses: Normal pulses.     Heart sounds: Normal heart sounds. No murmur heard.    No friction rub. No gallop.  Pulmonary:     Effort: Pulmonary effort is normal. No respiratory distress.     Breath sounds: Normal breath sounds. No wheezing, rhonchi or rales.  Chest:     Chest wall: No tenderness.  Abdominal:     General: Bowel sounds are normal. There is no distension.     Palpations: Abdomen is soft. There is no mass.     Tenderness: There is no abdominal tenderness. There is no right CVA tenderness, left CVA tenderness, guarding or rebound.  Musculoskeletal:        General: No swelling or tenderness.     Cervical back: Normal range of motion. No rigidity or tenderness.     Right hip: No deformity, tenderness or crepitus. Normal range of motion.     Left hip: Normal.     Right knee: Swelling present. No effusion, erythema, ecchymosis or crepitus. Decreased range of motion. No tenderness.     Left knee: Normal.     Right lower leg: Edema present.     Left lower leg: Edema present.     Comments: Knee high compression stockings in place   Lymphadenopathy:     Cervical: No cervical adenopathy.  Skin:    General: Skin is warm and dry.     Coloration: Skin is not pale.     Findings: No bruising, erythema, lesion or rash.  Neurological:     Mental Status: She is alert. Mental status is at baseline.     Cranial Nerves: No cranial nerve deficit.     Sensory: No sensory deficit.     Motor: No weakness.     Coordination: Coordination normal.     Gait: Gait abnormal.  Psychiatric:        Mood and Affect: Mood normal.  Speech: Speech normal.        Behavior: Behavior normal.     Labs reviewed: Recent Labs    09/04/21 0837 03/08/22 1146 06/16/22 1548  NA 141 143 142  K 3.7 4.3 4.1  CL 109 112* 107*  CO2 26 25 25   GLUCOSE 92 88 84  BUN 15 25 15   CREATININE 1.02* 1.05* 1.03*  CALCIUM 9.5 9.5 9.5   Recent Labs    09/04/21 0837 03/08/22 1146  AST 26 26  ALT 16 19  BILITOT 0.8 0.7  PROT 6.7 7.0   Recent Labs    09/04/21 0837 11/11/21 1026 03/08/22 1146  WBC 4.1 4.5 4.5  NEUTROABS 2,300 2.8 2,660  HGB 10.2* 10.8* 10.4*  HCT 33.2* 33.8* 33.3*  MCV 81.8 78.3 81.4  PLT 182 174.0 144   Lab Results  Component Value Date   TSH 1.00 03/08/2022   No results found for: "HGBA1C" Lab Results  Component Value Date   CHOL 195 03/08/2022   HDL 59 03/08/2022   LDLCALC 122 (H) 03/08/2022   TRIG 57 03/08/2022   CHOLHDL 3.3 03/08/2022    Significant Diagnostic Results in last 30 days:  DG Knee Complete 4 Views Right  Result Date: 07/22/2022 CLINICAL DATA:  Knee pain EXAM: RIGHT KNEE - COMPLETE 4+ VIEW COMPARISON:  None Available. FINDINGS: No fracture or malalignment. Moderate severe tricompartment arthritis of the knee. Positive for knee effusion. 3.5 cm calcification in the suprapatellar region likely due to a large calcified loose body. IMPRESSION: 1. 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 Probable large suprapatellar calcified loose body Electronically Signed   By: Donavan Foil M.D.   On: 07/22/2022 21:16    Assessment/Plan  1. Fall in home, initial encounter Has had x 2 fall episode at home per daughter  Linus Mako with walker at home and a cane at times. Advised to use walker for ambulation. Will obtain imaging as below and lab work to rule out other acute and metabolic etiologies  - DG Knee Complete 4 Views Right; Future - CBC with Differential/Platelet - BMP with eGFR([facility])  2. Right groin pain No tenderness on palpation.obtain imaging  - DG HIP  UNILAT W OR W/O PELVIS 2-3 VIEWS RIGHT; Future  3. Chronic pain of right knee Chronic but worse  - continue on Extra strength Tylenol  - DG Knee Complete 4 Views Right; Future  4. Acute pain of right thigh Pain onset post fall episode  - DG FEMUR 1V RIGHT; Future  5. Urine Frequency  Afebrile  Unable to provide urine specimen.  Urine cup supply given to collect urine specimen at home then return to office for urine culture.  Family/ staff Communication: Reviewed plan of care with patient and daughter verbalized understanding   Labs/tests ordered:  - DG Knee Complete 4 Views Right; Future - CBC with Differential/Platelet - BMP with eGFR([facility]) - DG HIP UNILAT W OR W/O PELVIS 2-3 VIEWS RIGHT; Future - DG FEMUR 1V RIGHT; Future  Next Appointment: Return if symptoms worsen or fail to improve.   Sandrea Hughs, NP

## 2022-07-29 ENCOUNTER — Other Ambulatory Visit: Payer: Self-pay

## 2022-07-29 ENCOUNTER — Other Ambulatory Visit: Payer: Medicare PPO

## 2022-07-29 DIAGNOSIS — R350 Frequency of micturition: Secondary | ICD-10-CM | POA: Diagnosis not present

## 2022-07-29 LAB — POCT URINALYSIS DIPSTICK
Bilirubin, UA: NEGATIVE
Glucose, UA: NEGATIVE
Ketones, UA: POSITIVE
Nitrite, UA: POSITIVE
Protein, UA: NEGATIVE
Spec Grav, UA: 1.025 (ref 1.010–1.025)
Urobilinogen, UA: 2 E.U./dL — AB
pH, UA: 5 (ref 5.0–8.0)

## 2022-07-29 LAB — CBC WITH DIFFERENTIAL/PLATELET
Absolute Monocytes: 403 cells/uL (ref 200–950)
Basophils Absolute: 32 cells/uL (ref 0–200)
Basophils Relative: 0.6 %
Eosinophils Absolute: 69 cells/uL (ref 15–500)
Eosinophils Relative: 1.3 %
HCT: 34.3 % — ABNORMAL LOW (ref 35.0–45.0)
Hemoglobin: 10.8 g/dL — ABNORMAL LOW (ref 11.7–15.5)
Lymphs Abs: 1272 cells/uL (ref 850–3900)
MCH: 25.5 pg — ABNORMAL LOW (ref 27.0–33.0)
MCHC: 31.5 g/dL — ABNORMAL LOW (ref 32.0–36.0)
MCV: 81.1 fL (ref 80.0–100.0)
MPV: 10.1 fL (ref 7.5–12.5)
Monocytes Relative: 7.6 %
Neutro Abs: 3525 cells/uL (ref 1500–7800)
Neutrophils Relative %: 66.5 %
Platelets: 174 10*3/uL (ref 140–400)
RBC: 4.23 10*6/uL (ref 3.80–5.10)
RDW: 14.5 % (ref 11.0–15.0)
Total Lymphocyte: 24 %
WBC: 5.3 10*3/uL (ref 3.8–10.8)

## 2022-07-29 LAB — BASIC METABOLIC PANEL WITH GFR
BUN/Creatinine Ratio: 15 (calc) (ref 6–22)
BUN: 15 mg/dL (ref 7–25)
CO2: 23 mmol/L (ref 20–32)
Calcium: 9.8 mg/dL (ref 8.6–10.4)
Chloride: 109 mmol/L (ref 98–110)
Creat: 1.02 mg/dL — ABNORMAL HIGH (ref 0.60–0.95)
Glucose, Bld: 87 mg/dL (ref 65–99)
Potassium: 4 mmol/L (ref 3.5–5.3)
Sodium: 141 mmol/L (ref 135–146)
eGFR: 53 mL/min/{1.73_m2} — ABNORMAL LOW (ref >=60)

## 2022-07-29 LAB — TEST AUTHORIZATION: TEST CODE:: 6399

## 2022-07-30 ENCOUNTER — Emergency Department (HOSPITAL_COMMUNITY): Payer: Medicare PPO

## 2022-07-30 ENCOUNTER — Other Ambulatory Visit: Payer: Self-pay

## 2022-07-30 ENCOUNTER — Observation Stay (HOSPITAL_COMMUNITY)
Admission: EM | Admit: 2022-07-30 | Discharge: 2022-08-03 | Disposition: A | Discharge status: Skilled Nursing Facility | Payer: Medicare PPO | Attending: Internal Medicine | Admitting: Internal Medicine

## 2022-07-30 ENCOUNTER — Encounter (HOSPITAL_COMMUNITY): Payer: Self-pay | Admitting: Internal Medicine

## 2022-07-30 DIAGNOSIS — I129 Hypertensive chronic kidney disease with stage 1 through stage 4 chronic kidney disease, or unspecified chronic kidney disease: Secondary | ICD-10-CM | POA: Insufficient documentation

## 2022-07-30 DIAGNOSIS — A419 Sepsis, unspecified organism: Secondary | ICD-10-CM | POA: Diagnosis not present

## 2022-07-30 DIAGNOSIS — S32501A Unspecified fracture of right pubis, initial encounter for closed fracture: Secondary | ICD-10-CM | POA: Diagnosis not present

## 2022-07-30 DIAGNOSIS — Y92009 Unspecified place in unspecified non-institutional (private) residence as the place of occurrence of the external cause: Secondary | ICD-10-CM | POA: Insufficient documentation

## 2022-07-30 DIAGNOSIS — W010XXA Fall on same level from slipping, tripping and stumbling without subsequent striking against object, initial encounter: Secondary | ICD-10-CM | POA: Insufficient documentation

## 2022-07-30 DIAGNOSIS — K439 Ventral hernia without obstruction or gangrene: Secondary | ICD-10-CM | POA: Diagnosis not present

## 2022-07-30 DIAGNOSIS — R509 Fever, unspecified: Secondary | ICD-10-CM | POA: Diagnosis not present

## 2022-07-30 DIAGNOSIS — I10 Essential (primary) hypertension: Secondary | ICD-10-CM | POA: Diagnosis present

## 2022-07-30 DIAGNOSIS — Z96652 Presence of left artificial knee joint: Secondary | ICD-10-CM | POA: Diagnosis not present

## 2022-07-30 DIAGNOSIS — Z853 Personal history of malignant neoplasm of breast: Secondary | ICD-10-CM | POA: Diagnosis not present

## 2022-07-30 DIAGNOSIS — S0990XA Unspecified injury of head, initial encounter: Secondary | ICD-10-CM | POA: Diagnosis not present

## 2022-07-30 DIAGNOSIS — N281 Cyst of kidney, acquired: Secondary | ICD-10-CM | POA: Diagnosis not present

## 2022-07-30 DIAGNOSIS — S32599A Other specified fracture of unspecified pubis, initial encounter for closed fracture: Secondary | ICD-10-CM | POA: Diagnosis not present

## 2022-07-30 DIAGNOSIS — R519 Headache, unspecified: Secondary | ICD-10-CM | POA: Insufficient documentation

## 2022-07-30 DIAGNOSIS — M25551 Pain in right hip: Secondary | ICD-10-CM | POA: Diagnosis not present

## 2022-07-30 DIAGNOSIS — S32591A Other specified fracture of right pubis, initial encounter for closed fracture: Secondary | ICD-10-CM | POA: Diagnosis not present

## 2022-07-30 DIAGNOSIS — G3184 Mild cognitive impairment, so stated: Secondary | ICD-10-CM | POA: Diagnosis present

## 2022-07-30 DIAGNOSIS — Z79899 Other long term (current) drug therapy: Secondary | ICD-10-CM | POA: Diagnosis not present

## 2022-07-30 DIAGNOSIS — R600 Localized edema: Secondary | ICD-10-CM | POA: Diagnosis not present

## 2022-07-30 DIAGNOSIS — N1831 Chronic kidney disease, stage 3a: Secondary | ICD-10-CM | POA: Diagnosis present

## 2022-07-30 DIAGNOSIS — Z66 Do not resuscitate: Secondary | ICD-10-CM | POA: Diagnosis present

## 2022-07-30 DIAGNOSIS — M1611 Unilateral primary osteoarthritis, right hip: Secondary | ICD-10-CM | POA: Diagnosis not present

## 2022-07-30 DIAGNOSIS — W19XXXA Unspecified fall, initial encounter: Secondary | ICD-10-CM

## 2022-07-30 DIAGNOSIS — M79604 Pain in right leg: Secondary | ICD-10-CM | POA: Diagnosis not present

## 2022-07-30 DIAGNOSIS — M1711 Unilateral primary osteoarthritis, right knee: Secondary | ICD-10-CM | POA: Diagnosis not present

## 2022-07-30 LAB — CBC WITH DIFFERENTIAL/PLATELET
Abs Immature Granulocytes: 0.01 10*3/uL (ref 0.00–0.07)
Basophils Absolute: 0 10*3/uL (ref 0.0–0.1)
Basophils Relative: 1 %
Eosinophils Absolute: 0.1 10*3/uL (ref 0.0–0.5)
Eosinophils Relative: 2 %
HCT: 34.1 % — ABNORMAL LOW (ref 36.0–46.0)
Hemoglobin: 10.6 g/dL — ABNORMAL LOW (ref 12.0–15.0)
Immature Granulocytes: 0 %
Lymphocytes Relative: 20 %
Lymphs Abs: 1.2 10*3/uL (ref 0.7–4.0)
MCH: 25.7 pg — ABNORMAL LOW (ref 26.0–34.0)
MCHC: 31.1 g/dL (ref 30.0–36.0)
MCV: 82.6 fL (ref 80.0–100.0)
Monocytes Absolute: 0.6 10*3/uL (ref 0.1–1.0)
Monocytes Relative: 10 %
Neutro Abs: 3.9 10*3/uL (ref 1.7–7.7)
Neutrophils Relative %: 67 %
Platelets: 177 10*3/uL (ref 150–400)
RBC: 4.13 MIL/uL (ref 3.87–5.11)
RDW: 15.8 % — ABNORMAL HIGH (ref 11.5–15.5)
WBC: 5.8 10*3/uL (ref 4.0–10.5)
nRBC: 0 % (ref 0.0–0.2)

## 2022-07-30 LAB — COMPREHENSIVE METABOLIC PANEL
ALT: 18 U/L (ref 0–44)
AST: 28 U/L (ref 15–41)
Albumin: 3.5 g/dL (ref 3.5–5.0)
Alkaline Phosphatase: 60 U/L (ref 38–126)
Anion gap: 10 (ref 5–15)
BUN: 14 mg/dL (ref 8–23)
CO2: 23 mmol/L (ref 22–32)
Calcium: 9.8 mg/dL (ref 8.9–10.3)
Chloride: 110 mmol/L (ref 98–111)
Creatinine, Ser: 1.01 mg/dL — ABNORMAL HIGH (ref 0.44–1.00)
GFR, Estimated: 54 mL/min — ABNORMAL LOW (ref >60)
Glucose, Bld: 100 mg/dL — ABNORMAL HIGH (ref 70–99)
Potassium: 4 mmol/L (ref 3.5–5.1)
Sodium: 143 mmol/L (ref 135–145)
Total Bilirubin: 0.9 mg/dL (ref 0.3–1.2)
Total Protein: 7 g/dL (ref 6.5–8.1)

## 2022-07-30 LAB — URINALYSIS, ROUTINE W REFLEX MICROSCOPIC
Bilirubin Urine: NEGATIVE
Glucose, UA: NEGATIVE mg/dL
Hgb urine dipstick: NEGATIVE
Ketones, ur: NEGATIVE mg/dL
Nitrite: NEGATIVE
Protein, ur: NEGATIVE mg/dL
Specific Gravity, Urine: 1.011 (ref 1.005–1.030)
pH: 5 (ref 5.0–8.0)

## 2022-07-30 LAB — CK: Total CK: 200 U/L (ref 38–234)

## 2022-07-30 MED ORDER — ONDANSETRON HCL 4 MG PO TABS: 4.0000 mg | ORAL_TABLET | Freq: Four times a day (QID) | ORAL | Status: DC | PRN

## 2022-07-30 MED ORDER — ONDANSETRON HCL 4 MG/2ML IJ SOLN: 4.0000 mg | Freq: Four times a day (QID) | INTRAMUSCULAR | Status: DC | PRN

## 2022-07-30 MED ORDER — IOHEXOL 300 MG/ML  SOLN
75.0000 mL | Freq: Once | INTRAMUSCULAR | Status: AC | PRN
Start: 2022-07-30 — End: 2022-07-30
  Administered 2022-07-30: 75 mL via INTRAVENOUS

## 2022-07-30 MED ORDER — LOSARTAN POTASSIUM 50 MG PO TABS
50.0000 mg | ORAL_TABLET | Freq: Every day | ORAL | Status: DC
  Administered 2022-07-30 – 2022-08-03 (×5): 50 mg via ORAL
  Filled 2022-07-30 (×5): qty 1

## 2022-07-30 MED ORDER — SODIUM CHLORIDE 0.9 % IV BOLUS
1000.0000 mL | Freq: Once | INTRAVENOUS | Status: AC
  Administered 2022-07-30: 1000 mL via INTRAVENOUS

## 2022-07-30 MED ORDER — ENOXAPARIN SODIUM 40 MG/0.4ML IJ SOSY
40.0000 mg | PREFILLED_SYRINGE | INTRAMUSCULAR | Status: DC
  Administered 2022-07-30 – 2022-08-02 (×5): 40 mg via SUBCUTANEOUS
  Filled 2022-07-30 (×4): qty 0.4

## 2022-07-30 MED ORDER — MORPHINE SULFATE (PF) 2 MG/ML IV SOLN: 2.0000 mg | INTRAVENOUS | Status: DC | PRN

## 2022-07-30 MED ORDER — DOCUSATE SODIUM 100 MG PO CAPS
100.0000 mg | ORAL_CAPSULE | Freq: Two times a day (BID) | ORAL | Status: DC
  Administered 2022-07-30: 100 mg via ORAL
  Filled 2022-07-30: qty 1

## 2022-07-30 MED ORDER — HYDRALAZINE HCL 20 MG/ML IJ SOLN: 5.0000 mg | INTRAMUSCULAR | Status: DC | PRN

## 2022-07-30 MED ORDER — POLYETHYLENE GLYCOL 3350 17 G PO PACK: 17.0000 g | PACK | Freq: Every day | ORAL | Status: DC | PRN

## 2022-07-30 MED ORDER — BISACODYL 5 MG PO TBEC: 5.0000 mg | DELAYED_RELEASE_TABLET | Freq: Every day | ORAL | Status: DC | PRN

## 2022-07-30 MED ORDER — ONDANSETRON HCL 4 MG/2ML IJ SOLN
4.0000 mg | Freq: Once | INTRAMUSCULAR | Status: AC
  Administered 2022-07-30: 4 mg via INTRAVENOUS
  Filled 2022-07-30: qty 2

## 2022-07-30 MED ORDER — ACETAMINOPHEN 650 MG RE SUPP: 650.0000 mg | Freq: Four times a day (QID) | RECTAL | Status: DC | PRN

## 2022-07-30 MED ORDER — OXYCODONE HCL 5 MG PO TABS
5.0000 mg | ORAL_TABLET | ORAL | Status: DC | PRN
  Administered 2022-08-01: 5 mg via ORAL
  Filled 2022-07-30: qty 1

## 2022-07-30 MED ORDER — PANTOPRAZOLE SODIUM 40 MG PO TBEC
40.0000 mg | DELAYED_RELEASE_TABLET | Freq: Every day | ORAL | Status: DC
  Administered 2022-07-30 – 2022-08-03 (×5): 40 mg via ORAL
  Filled 2022-07-30 (×5): qty 1

## 2022-07-30 MED ORDER — ACETAMINOPHEN 325 MG PO TABS
650.0000 mg | ORAL_TABLET | Freq: Four times a day (QID) | ORAL | Status: DC | PRN
  Administered 2022-07-30 – 2022-08-02 (×4): 650 mg via ORAL
  Filled 2022-07-30 (×5): qty 2

## 2022-07-30 MED ORDER — MORPHINE SULFATE (PF) 2 MG/ML IV SOLN
2.0000 mg | Freq: Once | INTRAVENOUS | Status: AC
  Administered 2022-07-30: 2 mg via INTRAVENOUS
  Filled 2022-07-30: qty 1

## 2022-07-30 NOTE — ED Triage Notes (Addendum)
EMS reports patient coming from fall due to fall at home. Found on floor by family. Patient hit head on wall, and c/o right leg pain , No deformities noted. No blood thinners. B/P 170/106, CBG 105

## 2022-07-30 NOTE — ED Notes (Signed)
ED TO INPATIENT HANDOFF REPORT  ED Nurse Name and Phone #: [phone]  S Name/Age/Gender Tracy Faulkner 88 y.o. female Room/Bed: 033C/033C  Code Status   Code Status: Prior  Home/SNF/Other Home Patient oriented to: self, place, time, and situation Is this baseline? Yes   Triage Complete: Triage complete  Chief Complaint Closed fracture of multiple pubic rami, initial encounter [facility]) [place]  Triage Note EMS reports patient coming from fall due to fall at home. Found on floor by family. Patient hit head on wall, and c/o right leg pain , No deformities noted. No blood thinners. B/P 170/106, CBG 105   Allergies Allergies  Allergen Reactions   Ace Inhibitors Cough   Lortab [Hydrocodone-Acetaminophen]     Bad dreams   Remeron [Mirtazapine] Other (See Comments)    Hallucinations (can take 1/2 tablet--7.5 mg), not 15 mg   Penicillins Rash and Other (See Comments)    Throat swelling, Has patient had a PCN reaction causing immediate rash, facial/tongue/throat swelling, SOB or lightheadedness with hypotension: Yes Has patient had a PCN reaction causing severe rash involving mucus membranes or skin necrosis: No Has patient had a PCN reaction that required hospitalization No Has patient had a PCN reaction occurring within the last 10 years: No If all of the above answers are "NO", then may proceed with Cephalosporin use.     Level of Care/Admitting Diagnosis ED Disposition     ED Disposition  Admit   Condition  --   [place]: [place] [[id number]]  Level of Care: Med-Surg [16]  May place patient in observation at [facility] or [facility] if equivalent level of care is available:: Yes  Covid Evaluation: Asymptomatic - no recent exposure (last 10 days) testing not required  Diagnosis: Closed fracture of multiple pubic rami, initial encounter [facility]) [[id number]]  Admitting Physician: Karmen Bongo [[id number]]  Attending Physician: Karmen Bongo  [[id number]]          B Medical/Surgery History Past Medical History:  Diagnosis Date   Anemia    Arthritis    Ataxia    Back problem    Bowel trouble    Breast cancer ([place])    Carpal tunnel syndrome    Constipation    Disturbance, sleep    Dyspnea    increased exertion    Frequent headaches    GI bleed    History of mammogram 2000   History of urinary tract infection    Hypertension    Obesity    Pain in both knees    Sepsis ([place])    Urinary incontinence    Past Surgical History:  Procedure Laterality Date   BREAST SURGERY     for cancer-right breast   COLON SURGERY     secondary to bowel blockages   EYE SURGERY     cataract surgery bilateral    gastric stapling     KNEE ARTHROPLASTY Left 10/07/2016   Procedure: LEFT TOTAL KNEE ARTHROPLASTY WITH COMPUTER NAVIGATION;  Surgeon: Rod Can, MD;  Location: [facility];  Service: Orthopedics;  Laterality: Left;     A IV Location/Drains/Wounds Patient Lines/Drains/Airways Status     Active Line/Drains/Airways     Name Placement date Placement time Site Days   Peripheral IV 07/30/22 22 G Left Antecubital 07/30/22  0628  Antecubital  less than 1   External Urinary Catheter 07/30/22  0653  --  less than 1            Intake/Output  Last 24 hours No intake or output data in the 24 hours ending 07/30/22 1434  Labs/Imaging Results for orders placed or performed during the hospital encounter of 07/30/22 (from the past 48 hour(s))  CBC with Differential     Status: Abnormal   Collection Time: 07/30/22  6:26 AM  Result Value Ref Range   WBC 5.8 4.0 - 10.5 K/uL   RBC 4.13 3.87 - 5.11 MIL/uL   Hemoglobin 10.6 (L) 12.0 - 15.0 g/dL   HCT 34.1 (L) 36.0 - 46.0 %   MCV 82.6 80.0 - 100.0 fL   MCH 25.7 (L) 26.0 - 34.0 pg   MCHC 31.1 30.0 - 36.0 g/dL   RDW 15.8 (H) 11.5 - 15.5 %   Platelets 177 150 - 400 K/uL   nRBC 0.0 0.0 - 0.2 %   Neutrophils Relative % 67 %   Neutro Abs 3.9 1.7 - 7.7 K/uL   Lymphocytes Relative 20 %    Lymphs Abs 1.2 0.7 - 4.0 K/uL   Monocytes Relative 10 %   Monocytes Absolute 0.6 0.1 - 1.0 K/uL   Eosinophils Relative 2 %   Eosinophils Absolute 0.1 0.0 - 0.5 K/uL   Basophils Relative 1 %   Basophils Absolute 0.0 0.0 - 0.1 K/uL   Immature Granulocytes 0 %   Abs Immature Granulocytes 0.01 0.00 - 0.07 K/uL    Comment: Performed at [facility], [address], [place], [place] [address]  Comprehensive metabolic panel     Status: Abnormal   Collection Time: 07/30/22  6:26 AM  Result Value Ref Range   Sodium 143 135 - 145 mmol/L   Potassium 4.0 3.5 - 5.1 mmol/L   Chloride 110 98 - 111 mmol/L   CO2 23 22 - 32 mmol/L   Glucose, Bld 100 (H) 70 - 99 mg/dL    Comment: Glucose reference range applies only to samples taken after fasting for at least 8 hours.   BUN 14 8 - 23 mg/dL   Creatinine, Ser 1.01 (H) 0.44 - 1.00 mg/dL   Calcium 9.8 8.9 - 10.3 mg/dL   Total Protein 7.0 6.5 - 8.1 g/dL   Albumin 3.5 3.5 - 5.0 g/dL   AST 28 15 - 41 U/L   ALT 18 0 - 44 U/L   Alkaline Phosphatase 60 38 - 126 U/L   Total Bilirubin 0.9 0.3 - 1.2 mg/dL   GFR, Estimated 54 (L) >60 mL/min    Comment: (NOTE) Calculated using the CKD-EPI Creatinine Equation (2021)    Anion gap 10 5 - 15    Comment: Performed at [facility] [address] [place], [place] [address]  CK     Status: None   Collection Time: 07/30/22  6:26 AM  Result Value Ref Range   Total CK 200 38 - 234 U/L    Comment: Performed at [facility], [place] [address], [place], [place] [address]  Urinalysis, Routine w reflex microscopic     Status: Abnormal   Collection Time: 07/30/22  7:19 AM  Result Value Ref Range   Color, Urine YELLOW YELLOW   APPearance CLEAR CLEAR   Specific Gravity, Urine 1.011 1.005 - 1.030   pH 5.0 5.0 - 8.0   Glucose, UA NEGATIVE NEGATIVE mg/dL   Hgb urine dipstick NEGATIVE NEGATIVE   Bilirubin Urine NEGATIVE NEGATIVE   Ketones, ur NEGATIVE NEGATIVE mg/dL   Protein, ur NEGATIVE NEGATIVE mg/dL    Nitrite NEGATIVE NEGATIVE   Leukocytes,Ua  SMALL (A) NEGATIVE   RBC / HPF 0-5 0 - 5 RBC/hpf   WBC, UA 0-5 0 - 5 WBC/hpf   Bacteria, UA MANY (A) NONE SEEN   Squamous Epithelial / LPF 0-5 0 - 5    Comment: Performed at [facility], [place] [address], [place], [place] [address]   CT ABDOMEN PELVIS W CONTRAST  Result Date: 07/30/2022 CLINICAL DATA:  Complicated ventral hernia, fall at home, found on floor by family, struck head on wall, RIGHT leg pain EXAM: CT ABDOMEN AND PELVIS WITH CONTRAST TECHNIQUE: Multidetector CT imaging of the abdomen and pelvis was performed using the standard protocol following bolus administration of intravenous contrast. RADIATION DOSE REDUCTION: This exam was performed according to the departmental dose-optimization program which includes automated exposure control, adjustment of the mA and/or kV according to patient size and/or use of iterative reconstruction technique. CONTRAST:  50m OMNIPAQUE IOHEXOL 300 MG/ML SOLN IV. No oral contrast. COMPARISON:  None Available. FINDINGS: Lower chest: Minimal bibasilar atelectasis. Minimal enlargement of cardiac chambers. Hepatobiliary: Post cholecystectomy. Small hepatic cysts. Liver otherwise unremarkable. Pancreas: Normal appearance Spleen: Normal appearance Adrenals/Urinary Tract: Adrenal glands and RIGHT kidney normal appearance. Upper pole LEFT renal cyst 2.5 cm diameter; no follow-up imaging recommended. Kidneys, ureters, and bladder otherwise normal appearance Stomach/Bowel: Transverse colon and a small bowel loop extend into a supraumbilical ventral hernia without obstruction. Multiple small bowel loops extend into a umbilical hernia without obstruction. Sigmoid diverticulosis. Appendix not definitely visualized. Stomach and remaining bowel loops normal appearance. Perigastric surgical clips noted. Vascular/Lymphatic: Atherosclerotic calcifications aorta and iliac arteries without aneurysm. Vascular structures patent. Few  normal sized mesenteric and retroperitoneal nodes without adenopathy. Reproductive: Calcified leiomyoma within uterus 3.0 cm diameter. Uterus and adnexa otherwise unremarkable Other: No free air or free fluid. Musculoskeletal: Diffuse osseous demineralization. Multilevel degenerative disc and facet disease changes lumbar spine with degenerative grade 1 anterolisthesis L3-L4 IMPRESSION: Supraumbilical ventral hernia containing nonobstructed transverse colon and a small bowel loop. Multiple small bowel loops extend into an umbilical hernia without obstruction. Sigmoid diverticulosis without evidence of diverticulitis. Calcified uterine leiomyoma 3.0 cm diameter. No acute intra-abdominal or intrapelvic abnormalities. Aortic Atherosclerosis (ICD10-I70.0). Electronically Signed   By: MLavonia DanaM.D.   On: 07/30/2022 12:59   CT Hip Right Wo Contrast  Result Date: 07/30/2022 CLINICAL DATA:  Fall at home. Found down. Hip pain, fracture suspected. EXAM: CT OF THE RIGHT HIP WITHOUT CONTRAST TECHNIQUE: Multidetector CT imaging of the right hip was performed according to the standard protocol. Multiplanar CT image reconstructions were also generated. RADIATION DOSE REDUCTION: This exam was performed according to the departmental dose-optimization program which includes automated exposure control, adjustment of the mA and/or kV according to patient size and/or use of iterative reconstruction technique. COMPARISON:  Radiographs 07/30/2022 and 07/28/2022. FINDINGS: Bones/Joint/Cartilage The bones are demineralized. There is an acute nondisplaced fracture of the right superior pubic ramus. There is a mildly displaced acute fracture of the right inferior pubic ramus. No definite acute fracture of the proximal femur is identified. There is no dislocation or evidence of femoral head osteonecrosis. A small right hip joint effusion is present. There are mild degenerative changes at the right hip, sacroiliac joint and symphysis  pubis. Ligaments Suboptimally assessed by CT. Muscles and Tendons No evidence of intramuscular hematoma or significant atrophy. Soft tissues Generalized subcutaneous edema surrounding the right hip without focal fluid collection, foreign body or soft tissue emphysema. Calcified uterine fibroids are noted. A large ventral abdominal wall hernia containing multiple  loops of bowel is only partially imaged. There are diverticular changes within the sigmoid colon and diffuse aortoiliac atherosclerosis. IMPRESSION: 1. Acute fractures of the right superior and inferior pubic rami. 2. No evidence of acute proximal femur fracture or dislocation. Small nonspecific hip joint effusion. 3. Mild degenerative changes as described. 4. Large ventral abdominal wall hernia containing multiple loops of bowel, incompletely visualized. Electronically Signed   By: Richardean Sale M.D.   On: 07/30/2022 11:03   CT Head Wo Contrast  Result Date: 07/30/2022 CLINICAL DATA:  Status post fall.  Headache. EXAM: CT HEAD WITHOUT CONTRAST TECHNIQUE: Contiguous axial images were obtained from the base of the skull through the vertex without intravenous contrast. RADIATION DOSE REDUCTION: This exam was performed according to the departmental dose-optimization program which includes automated exposure control, adjustment of the mA and/or kV according to patient size and/or use of iterative reconstruction technique. COMPARISON:  None Available. FINDINGS: Brain: Ventricles and sulci are prominent compatible with atrophy. No evidence for acute cortical based infarct, intracranial hemorrhage, mass lesion or mass effect. Periventricular and subcortical white matter hypodensities compatible with chronic microvascular ischemic changes. Vascular: Unremarkable Skull: Intact. Sinuses/Orbits: Paranasal sinuses are well aerated. Mastoid air cells are unremarkable. Other: None. IMPRESSION: No acute intracranial process. Electronically Signed   By: Lovey Newcomer  M.D.   On: 07/30/2022 07:50   DG Knee Complete 4 Views Right  Result Date: 07/30/2022 CLINICAL DATA:  88 year old female status post fall with pain. EXAM: RIGHT KNEE - COMPLETE 4+ VIEW COMPARISON:  Right knee series 07/28/2022 and earlier. FINDINGS: Severe tricompartmental joint space loss and bulky degenerative spurring with coarsely calcified 3.5 cm suprapatellar loose body redemonstrated. No superimposed acute fracture or dislocation identified. Stable since 07/21/2022. IMPRESSION: 1. No acute fracture or dislocation identified about the right knee. 2. Very severe tricompartmental degenerative changes with stable chronic suprapatellar loose body. Electronically Signed   By: Genevie Ann M.D.   On: 07/30/2022 07:43   DG Hip Unilat W or Wo Pelvis 2-3 Views Right  Result Date: 07/30/2022 CLINICAL DATA:  88 year old female status post fall with right hip pain. EXAM: DG HIP (WITH OR WITHOUT PELVIS) 2-3V RIGHT COMPARISON:  Right femur series and pelvis 07/28/2022. FINDINGS: Femoral heads remain normally located. Pelvis appears stable and intact. Grossly stable proximal left femur. Dystrophic uterine fibroid calcification in the pelvis is stable. Negative visible bowel gas. Subtle irregularity of the right femoral neck compared to the left side. Intertrochanteric segment appears to remain intact. Proximal right femoral shaft appears intact. IMPRESSION: 1. Subtle irregularity of the right femoral neck stable since 07/28/2022. Difficult to exclude nondisplaced fracture. If the patient is unable to weightbear MRI is the preferred modality for further evaluation. 2.  No acute fracture or dislocation identified in the pelvis. Electronically Signed   By: Genevie Ann M.D.   On: 07/30/2022 07:41   DG Pelvis 1-2 Views  Result Date: 07/29/2022 CLINICAL DATA:  Pelvic/groin pain after a fall. EXAM: PELVIS - 1-2 VIEW COMPARISON:  None Available. FINDINGS: There is no evidence of pelvic fracture or diastasis. No pelvic bone lesions  are seen. Degenerative changes are seen in the spine. IMPRESSION: No acute osseous injury. Electronically Signed   By: Zerita Boers M.D.   On: 07/29/2022 14:54   DG FEMUR, MIN 2 VIEWS RIGHT  Result Date: 07/29/2022 CLINICAL DATA:  Right thigh pain after a fall. EXAM: RIGHT FEMUR 2 VIEWS COMPARISON:  None Available. FINDINGS: There is no evidence of fracture or other focal  bone lesions involving the femur. Soft tissues are unremarkable. IMPRESSION: No acute osseous injury of the femur. Electronically Signed   By: Zerita Boers M.D.   On: 07/29/2022 14:53   DG Knee 1-2 Views Right  Result Date: 07/29/2022 CLINICAL DATA:  Knee pain after a fall. EXAM: RIGHT KNEE - 1-2 VIEW COMPARISON:  Knee radiographs dated 07/21/2022. FINDINGS: There is depression of the lateral tibial plateau which appears chronic, however the bones are diffusely demineralized and an acute impaction fracture is difficult to exclude. No acute displaced fracture is identified. 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 A calcification in the suprapatellar region likely represents a calcified loose body measuring 3.9 cm in size. There is no knee joint effusion IMPRESSION: Chronic appearing depression of the lateral tibial plateau, however the bones are diffusely demineralized and an acute impaction fracture is difficult to exclude. Electronically Signed   By: Zerita Boers M.D.   On: 07/29/2022 14:53    Pending Labs Unresulted Labs (From admission, onward)    None       Vitals/Pain Today's Vitals   07/30/22 1130 07/30/22 1230 07/30/22 1319 07/30/22 1330  BP: (!) 157/94 (!) 165/99  (!) 156/94  Pulse: 72 72  72  Resp: '20 19  16  '$ Temp:      TempSrc:      SpO2: 100% 100%  98%  PainSc:   6      Isolation Precautions No active isolations  Medications Medications  sodium chloride 0.9 % bolus 1,000 mL (1,000 mLs Intravenous New Bag/Given 07/30/22 1029)  morphine (PF) 2 MG/ML injection 2 mg (2 mg Intravenous  Given 07/30/22 1320)  ondansetron (ZOFRAN) injection 4 mg (4 mg Intravenous Given 07/30/22 1320)  iohexol (OMNIPAQUE) 300 MG/ML solution 75 mL (75 mLs Intravenous Contrast Given 07/30/22 1250)    Mobility walks with device normally - non ambulatory at present High fall risk   Focused Assessments    R Recommendations: See Admitting Provider Note  Report given to:   Additional Notes:   4

## 2022-07-30 NOTE — ED Notes (Signed)
Per Ana, 5N RN, okay to transport pt to floor

## 2022-07-30 NOTE — ED Notes (Signed)
Pt in CT.

## 2022-07-30 NOTE — H&P (Signed)
History and Physical    Patient: Tracy Faulkner Tracy Faulkner DOB: 30-Jul-1932 DOA: 07/30/2022 DOS: the patient was seen and examined on 07/30/2022 PCP: Ngetich, Nelda Bucks, NP  Patient coming from: Home - lives with daughter, granddaughter, great-granddaughter; NOK: Daughter, [phone]   Chief Complaint: Fall  HPI: Tracy Faulkner is a 88 y.o. female with medical history significant of breast cancer and HTN presenting with a fall.  She has been falling at home, complaining of more joint pain in the last month or so.  She fell yesterday and was put to bed in the evening.  Her granddaughter found her in the floor about 0400.  She is not currently having pain.      ER Course:  Mechanical fall overnight.  R leg pain, was supposed to see Dr. Noemi Chapel for this issue today (pre-existing - but worse).  Unable to get up and walk.  CT shows R pubic rami fractures and ventral hernia (chronic and stable on CT).   Needs pain control, PT/OT, ortho outpatient f/u.     Review of Systems: As mentioned in the history of present illness. All other systems reviewed and are negative. Past Medical History:  Diagnosis Date   Anemia    Arthritis    Ataxia    Back problem    Bowel trouble    Breast cancer ([place])    Carpal tunnel syndrome    Constipation    Disturbance, sleep    Dyspnea    increased exertion    Frequent headaches    GI bleed    History of mammogram 2000   History of urinary tract infection    Hypertension    Obesity    Pain in both knees    Sepsis ([place])    Urinary incontinence    Past Surgical History:  Procedure Laterality Date   BREAST SURGERY     for cancer-right breast   COLON SURGERY     secondary to bowel blockages   EYE SURGERY     cataract surgery bilateral    gastric stapling     KNEE ARTHROPLASTY Left 10/07/2016   Procedure: LEFT TOTAL KNEE ARTHROPLASTY WITH COMPUTER NAVIGATION;  Surgeon: Rod Can, MD;  Location: [facility];  Service: Orthopedics;  Laterality: Left;    Social History:  reports that she has never smoked. She has never used smokeless tobacco. She reports that she does not drink alcohol and does not use drugs.  Allergies  Allergen Reactions   Ace Inhibitors Cough   Lortab [Hydrocodone-Acetaminophen] Other (See Comments)    Bad dreams   Remeron [Mirtazapine] Other (See Comments)    Hallucinations (can take 1/2 tablet--7.5 mg), not 15 mg   Penicillins Rash and Other (See Comments)    Throat swelling, Has patient had a PCN reaction causing immediate rash, facial/tongue/throat swelling, SOB or lightheadedness with hypotension: Yes Has patient had a PCN reaction causing severe rash involving mucus membranes or skin necrosis: No Has patient had a PCN reaction that required hospitalization No Has patient had a PCN reaction occurring within the last 10 years: No If all of the above answers are "NO", then may proceed with Cephalosporin use.     Family History  Problem Relation Age of Onset   Arthritis Mother    Diabetes Mellitus II Mother    Hypertension Mother    Breast cancer Mother    Heart disease Father     Prior to Admission medications   Medication Sig Start Date End Date Taking?  Authorizing Provider  acetaminophen (TYLENOL) 650 MG CR tablet Take 650 mg by mouth daily as needed for pain.    [provider]  CALCIUM PO Take 1 capsule by mouth daily.    [provider]  cycloSPORINE (RESTASIS) 0.05 % ophthalmic emulsion Place 1 drop into both eyes as needed. 07/28/21   Ngetich, Dinah C, NP  losartan (COZAAR) 50 MG tablet Take 1 tablet by mouth once daily 01/05/22   Ngetich, Dinah C, NP  Multiple Vitamin (MULTIVITAMIN) tablet Take 1 tablet by mouth daily.    [provider]  Omega-3 Fatty Acids (FISH OIL PO) Take 1 capsule by mouth daily.    [provider]  omeprazole (PRILOSEC) 40 MG capsule TAKE 1 CAPSULE BY MOUTH ONCE DAILY BEFORE BREAKFAST 07/28/21   Ngetich, Dinah C, NP  OVER THE COUNTER  MEDICATION Take 1 capsule by mouth daily. Gluco Gel Joint & Cartilage Support    [provider]  polyethylene glycol (MIRALAX) 17 g packet Take 17 g by mouth as needed.    [provider]    Physical Exam: Vitals:   07/30/22 1500 07/30/22 1530 07/30/22 1530 07/30/22 1655  BP: (!) 166/96 (!) 150/90  (!) 188/101  Pulse: 75 79  72  Resp: 16 19    Temp:   97.6 F (36.4 C) 97.6 F (36.4 C)  TempSrc:   Oral Oral  SpO2: 100% 100%  100%   General:  Appears calm and comfortable and is in NAD, resting comfortably with her eyes closed Eyes:  PERRL, EOMI, normal lids, iris ENT:  grossly normal hearing, lips & tongue, mmm Neck:  no LAD, masses or thyromegaly Cardiovascular:  RRR, no m/r/g. 1+ LE edema.  Respiratory:   CTA bilaterally with no wheezes/rales/rhonchi.  Normal respiratory effort. Abdomen:  soft, NT, ND Skin:  no rash or induration seen on limited exam Musculoskeletal:  no bony abnormality Psychiatric: blunted mood and affect, speech fluent and appropriate Neurologic:  CN 2-12 grossly intact   Radiological Exams on Admission: Independently reviewed - see discussion in A/P where applicable  CT ABDOMEN PELVIS W CONTRAST  Result Date: 07/30/2022 CLINICAL DATA:  Complicated ventral hernia, fall at home, found on floor by family, struck head on wall, RIGHT leg pain EXAM: CT ABDOMEN AND PELVIS WITH CONTRAST TECHNIQUE: Multidetector CT imaging of the abdomen and pelvis was performed using the standard protocol following bolus administration of intravenous contrast. RADIATION DOSE REDUCTION: This exam was performed according to the departmental dose-optimization program which includes automated exposure control, adjustment of the mA and/or kV according to patient size and/or use of iterative reconstruction technique. CONTRAST:  4m OMNIPAQUE IOHEXOL 300 MG/ML SOLN IV. No oral contrast. COMPARISON:  None Available. FINDINGS: Lower chest: Minimal bibasilar atelectasis.  Minimal enlargement of cardiac chambers. Hepatobiliary: Post cholecystectomy. Small hepatic cysts. Liver otherwise unremarkable. Pancreas: Normal appearance Spleen: Normal appearance Adrenals/Urinary Tract: Adrenal glands and RIGHT kidney normal appearance. Upper pole LEFT renal cyst 2.5 cm diameter; no follow-up imaging recommended. Kidneys, ureters, and bladder otherwise normal appearance Stomach/Bowel: Transverse colon and a small bowel loop extend into a supraumbilical ventral hernia without obstruction. Multiple small bowel loops extend into a umbilical hernia without obstruction. Sigmoid diverticulosis. Appendix not definitely visualized. Stomach and remaining bowel loops normal appearance. Perigastric surgical clips noted. Vascular/Lymphatic: Atherosclerotic calcifications aorta and iliac arteries without aneurysm. Vascular structures patent. Few normal sized mesenteric and retroperitoneal nodes without adenopathy. Reproductive: Calcified leiomyoma within uterus 3.0 cm diameter. Uterus and adnexa otherwise unremarkable Other:  No free air or free fluid. Musculoskeletal: Diffuse osseous demineralization. Multilevel degenerative disc and facet disease changes lumbar spine with degenerative grade 1 anterolisthesis L3-L4 IMPRESSION: Supraumbilical ventral hernia containing nonobstructed transverse colon and a small bowel loop. Multiple small bowel loops extend into an umbilical hernia without obstruction. Sigmoid diverticulosis without evidence of diverticulitis. Calcified uterine leiomyoma 3.0 cm diameter. No acute intra-abdominal or intrapelvic abnormalities. Aortic Atherosclerosis (ICD10-I70.0). Electronically Signed   By: Lavonia Dana M.D.   On: 07/30/2022 12:59   CT Hip Right Wo Contrast  Result Date: 07/30/2022 CLINICAL DATA:  Fall at home. Found down. Hip pain, fracture suspected. EXAM: CT OF THE RIGHT HIP WITHOUT CONTRAST TECHNIQUE: Multidetector CT imaging of the right hip was performed according to the  standard protocol. Multiplanar CT image reconstructions were also generated. RADIATION DOSE REDUCTION: This exam was performed according to the departmental dose-optimization program which includes automated exposure control, adjustment of the mA and/or kV according to patient size and/or use of iterative reconstruction technique. COMPARISON:  Radiographs 07/30/2022 and 07/28/2022. FINDINGS: Bones/Joint/Cartilage The bones are demineralized. There is an acute nondisplaced fracture of the right superior pubic ramus. There is a mildly displaced acute fracture of the right inferior pubic ramus. No definite acute fracture of the proximal femur is identified. There is no dislocation or evidence of femoral head osteonecrosis. A small right hip joint effusion is present. There are mild degenerative changes at the right hip, sacroiliac joint and symphysis pubis. Ligaments Suboptimally assessed by CT. Muscles and Tendons No evidence of intramuscular hematoma or significant atrophy. Soft tissues Generalized subcutaneous edema surrounding the right hip without focal fluid collection, foreign body or soft tissue emphysema. Calcified uterine fibroids are noted. A large ventral abdominal wall hernia containing multiple loops of bowel is only partially imaged. There are diverticular changes within the sigmoid colon and diffuse aortoiliac atherosclerosis. IMPRESSION: 1. Acute fractures of the right superior and inferior pubic rami. 2. No evidence of acute proximal femur fracture or dislocation. Small nonspecific hip joint effusion. 3. Mild degenerative changes as described. 4. Large ventral abdominal wall hernia containing multiple loops of bowel, incompletely visualized. Electronically Signed   By: Richardean Sale M.D.   On: 07/30/2022 11:03   CT Head Wo Contrast  Result Date: 07/30/2022 CLINICAL DATA:  Status post fall.  Headache. EXAM: CT HEAD WITHOUT CONTRAST TECHNIQUE: Contiguous axial images were obtained from the base of  the skull through the vertex without intravenous contrast. RADIATION DOSE REDUCTION: This exam was performed according to the departmental dose-optimization program which includes automated exposure control, adjustment of the mA and/or kV according to patient size and/or use of iterative reconstruction technique. COMPARISON:  None Available. FINDINGS: Brain: Ventricles and sulci are prominent compatible with atrophy. No evidence for acute cortical based infarct, intracranial hemorrhage, mass lesion or mass effect. Periventricular and subcortical white matter hypodensities compatible with chronic microvascular ischemic changes. Vascular: Unremarkable Skull: Intact. Sinuses/Orbits: Paranasal sinuses are well aerated. Mastoid air cells are unremarkable. Other: None. IMPRESSION: No acute intracranial process. Electronically Signed   By: Lovey Newcomer M.D.   On: 07/30/2022 07:50   DG Knee Complete 4 Views Right  Result Date: 07/30/2022 CLINICAL DATA:  88 year old female status post fall with pain. EXAM: RIGHT KNEE - COMPLETE 4+ VIEW COMPARISON:  Right knee series 07/28/2022 and earlier. FINDINGS: Severe tricompartmental joint space loss and bulky degenerative spurring with coarsely calcified 3.5 cm suprapatellar loose body redemonstrated. No superimposed acute fracture or dislocation identified. Stable since 07/21/2022. IMPRESSION: 1. No acute  fracture or dislocation identified about the right knee. 2. Very severe tricompartmental degenerative changes with stable chronic suprapatellar loose body. Electronically Signed   By: Genevie Ann M.D.   On: 07/30/2022 07:43   DG Hip Unilat W or Wo Pelvis 2-3 Views Right  Result Date: 07/30/2022 CLINICAL DATA:  88 year old female status post fall with right hip pain. EXAM: DG HIP (WITH OR WITHOUT PELVIS) 2-3V RIGHT COMPARISON:  Right femur series and pelvis 07/28/2022. FINDINGS: Femoral heads remain normally located. Pelvis appears stable and intact. Grossly stable proximal left  femur. Dystrophic uterine fibroid calcification in the pelvis is stable. Negative visible bowel gas. Subtle irregularity of the right femoral neck compared to the left side. Intertrochanteric segment appears to remain intact. Proximal right femoral shaft appears intact. IMPRESSION: 1. Subtle irregularity of the right femoral neck stable since 07/28/2022. Difficult to exclude nondisplaced fracture. If the patient is unable to weightbear MRI is the preferred modality for further evaluation. 2.  No acute fracture or dislocation identified in the pelvis. Electronically Signed   By: Genevie Ann M.D.   On: 07/30/2022 07:41    EKG: Independently reviewed.  NSR with rate 78; artifact with nonspecific ST changes with no evidence of acute ischemia   Labs on Admission: I have personally reviewed the available labs and imaging studies at the time of the admission.  Pertinent labs:    BUN 14/Creatinine 1.01/GFR 54 - stable CK 200 WBC 5.8 Hgb 10.6 UA: small LE, many bacteria   Assessment and Plan: Principal Problem:   Closed fracture of multiple pubic rami, initial encounter ([place]) Active Problems:   Hypertension   MCI (mild cognitive impairment)   Chronic kidney disease, stage 3a ([facility])   DNR (do not resuscitate)    Pelvic fractures -Patient with concern for recent dementia and recent recurrent falls but uncertain history of recent falls presenting after she was found in the floor this AM -The EDP attempted to get her up and she was minimally able to bear weight -She has also had progressive difficulty with ambulation, increasingly sedentary -Imaging shows preserved femurs but R superior and inferior rami fractures -Unfortunately, this is generally conservatively managed with pain control and PT/OT -She may require SNF rehab - patient and family are open to this option -Orthopedics was consulted by telephone and will plan for outpatient f/u  HTN -Continue losartan  MCI -Her daughter has noticed  this issue recently -Will order delirium precautions  Stage 3a CKD -Appears to be chronic -Avoid nephrotoxic agents where possible -Recheck BMP in AM  DNR -I have discussed code status with the patient and her daughter and  they are in agreement that the patient would not desire resuscitation and would prefer to die a natural death should that situation arise. -She will need a gold out of facility DNR form at the time of discharge    Advance Care Planning:   Code Status: DNR   Consults: PT/OT; Nutrition; TOC team  DVT Prophylaxis: Lovenox  Family Communication: Daughter was present throughout evaluation  Severity of Illness: The appropriate patient status for this patient is OBSERVATION. Observation status is judged to be reasonable and necessary in order to provide the required intensity of service to ensure the patient's safety. The patient's presenting symptoms, physical exam findings, and initial radiographic and laboratory data in the context of their medical condition is felt to place them at decreased risk for further clinical deterioration. Furthermore, it is anticipated that the patient will be medically stable for  discharge from the hospital within 2 midnights of admission.   Author: Karmen Bongo, MD 07/30/2022 5:40 PM  For on call review [email or web address].

## 2022-07-30 NOTE — ED Notes (Signed)
Attempted to ambulate pt; pt able to stand, but unable to take a step; PA Blue at bedside during attempted ambulation

## 2022-07-30 NOTE — ED Provider Notes (Signed)
[facility] EMERGENCY DEPARTMENT Provider Note   CSN: [id number] Arrival date & time: 07/30/22  5638     History  Chief Complaint  Patient presents with   Fall    EMS reports patient coming from fall due to fall at home. Found on floor by family. Patient hit head on wall, and c/o right leg pain, no deformities noted. No blood thinners. B/P 170/106, CBG 105    Tracy Faulkner is a 88 y.o. female with a PMHx of hypertension, who presents to the Emergency Department complaining of a mechanical fall onset PTA.  Fall was unwitnessed, she was found on the floor by family. Daughter notes that the patient was found at 4 AM.  Patient denies feeling dizzy or lightheaded prior to her fall.  Notes that she got up to go use the bathroom when she slipped and hit her head.  Has associated right leg pain. Patient started notes that patient has a follow-up appointment today with Dr. Barry Dienes for her right leg pain that has been ongoing.  Recently had x-ray imaging last week.  No anticoagulants at this time.    The history is provided by the patient and a relative. No language interpreter was used.       Home Medications Prior to Admission medications   Medication Sig Start Date End Date Taking? Authorizing Provider  acetaminophen (TYLENOL) 650 MG CR tablet Take 650 mg by mouth daily as needed for pain.    [provider]  CALCIUM PO Take 1 capsule by mouth daily.    [provider]  cycloSPORINE (RESTASIS) 0.05 % ophthalmic emulsion Place 1 drop into both eyes as needed. 07/28/21   Ngetich, Dinah C, NP  losartan (COZAAR) 50 MG tablet Take 1 tablet by mouth once daily 01/05/22   Ngetich, Dinah C, NP  Multiple Vitamin (MULTIVITAMIN) tablet Take 1 tablet by mouth daily.    [provider]  Omega-3 Fatty Acids (FISH OIL PO) Take 1 capsule by mouth daily.    [provider]  omeprazole (PRILOSEC) 40 MG capsule TAKE 1 CAPSULE BY MOUTH ONCE DAILY BEFORE  BREAKFAST 07/28/21   Ngetich, Dinah C, NP  OVER THE COUNTER MEDICATION Take 1 capsule by mouth daily. Gluco Gel Joint & Cartilage Support    [provider]  polyethylene glycol (MIRALAX) 17 g packet Take 17 g by mouth as needed.    [provider]      Allergies    Ace inhibitors, Lortab [hydrocodone-acetaminophen], Remeron [mirtazapine], and Penicillins    Review of Systems   Review of Systems  Musculoskeletal:  Positive for arthralgias. Negative for back pain, joint swelling and neck pain.  Skin:  Negative for color change and wound.  Neurological:  Negative for syncope.  All other systems reviewed and are negative.   Physical Exam Updated Vital Signs BP (!) 156/94   Pulse 72   Temp 97.6 F (36.4 C) (Oral)   Resp 16   SpO2 98%  Physical Exam Vitals and nursing note reviewed.  Constitutional:      General: She is not in acute distress.    Appearance: She is not diaphoretic.  HENT:     Head: Normocephalic and atraumatic.     Mouth/Throat:     Pharynx: No oropharyngeal exudate.  Eyes:     General: No scleral icterus.    Conjunctiva/sclera: Conjunctivae normal.  Cardiovascular:     Rate and Rhythm: Normal rate and regular rhythm.  Pulses: Normal pulses.     Heart sounds: Normal heart sounds.  Pulmonary:     Effort: Pulmonary effort is normal. No respiratory distress.     Breath sounds: Normal breath sounds. No wheezing.  Abdominal:     General: Bowel sounds are normal.     Palpations: Abdomen is soft. There is no mass.     Tenderness: There is no abdominal tenderness. There is no guarding or rebound.     Hernia: A hernia is present.  Musculoskeletal:        General: Normal range of motion.     Cervical back: Normal range of motion and neck supple.     Comments: No spinal tenderness to palpation.  No tenderness to palpation noted to musculature of back.  She has a sensation intact to bilateral upper and lower extremities.  Tenderness to palpation  noted to right anterior hip with normal flexion and extension.  Tenderness to palpation noted to right proximal tibial region without obvious deformity.  Normal flexion and extension of right hip with mild tenderness to palpation.  Skin:    General: Skin is warm and dry.  Neurological:     Mental Status: She is alert.     Comments: Negative pronator drift.  No focal neurologic deficits.  Psychiatric:        Behavior: Behavior normal.     ED Results / Procedures / Treatments   Labs (all labs ordered are listed, but only abnormal results are displayed) Labs Reviewed  CBC WITH DIFFERENTIAL/PLATELET - Abnormal; Notable for the following components:      Result Value   Hemoglobin 10.6 (*)    HCT 34.1 (*)    MCH 25.7 (*)    RDW 15.8 (*)    All other components within normal limits  COMPREHENSIVE METABOLIC PANEL - Abnormal; Notable for the following components:   Glucose, Bld 100 (*)    Creatinine, Ser 1.01 (*)    GFR, Estimated 54 (*)    All other components within normal limits  URINALYSIS, ROUTINE W REFLEX MICROSCOPIC - Abnormal; Notable for the following components:   Leukocytes,Ua SMALL (*)    Bacteria, UA MANY (*)    All other components within normal limits  CK    EKG EKG Interpretation  Date/Time:  Friday July 30 2022 06:21:10 EDT Ventricular Rate:  78 PR Interval:  182 QRS Duration: 105 QT Interval:  380 QTC Calculation: 433 R Axis:   59 Text Interpretation: Sinus rhythm Artifact in lead(s) I II III aVR aVL aVF V1 V2 Confirmed by Merrily Pew [phone]) on 07/30/2022 6:36:52 AM  Radiology CT ABDOMEN PELVIS W CONTRAST  Result Date: 07/30/2022 CLINICAL DATA:  Complicated ventral hernia, fall at home, found on floor by family, struck head on wall, RIGHT leg pain EXAM: CT ABDOMEN AND PELVIS WITH CONTRAST TECHNIQUE: Multidetector CT imaging of the abdomen and pelvis was performed using the standard protocol following bolus administration of intravenous contrast.  RADIATION DOSE REDUCTION: This exam was performed according to the departmental dose-optimization program which includes automated exposure control, adjustment of the mA and/or kV according to patient size and/or use of iterative reconstruction technique. CONTRAST:  29m OMNIPAQUE IOHEXOL 300 MG/ML SOLN IV. No oral contrast. COMPARISON:  None Available. FINDINGS: Lower chest: Minimal bibasilar atelectasis. Minimal enlargement of cardiac chambers. Hepatobiliary: Post cholecystectomy. Small hepatic cysts. Liver otherwise unremarkable. Pancreas: Normal appearance Spleen: Normal appearance Adrenals/Urinary Tract: Adrenal glands and RIGHT kidney normal appearance. Upper pole LEFT renal cyst 2.5 cm diameter;  no follow-up imaging recommended. Kidneys, ureters, and bladder otherwise normal appearance Stomach/Bowel: Transverse colon and a small bowel loop extend into a supraumbilical ventral hernia without obstruction. Multiple small bowel loops extend into a umbilical hernia without obstruction. Sigmoid diverticulosis. Appendix not definitely visualized. Stomach and remaining bowel loops normal appearance. Perigastric surgical clips noted. Vascular/Lymphatic: Atherosclerotic calcifications aorta and iliac arteries without aneurysm. Vascular structures patent. Few normal sized mesenteric and retroperitoneal nodes without adenopathy. Reproductive: Calcified leiomyoma within uterus 3.0 cm diameter. Uterus and adnexa otherwise unremarkable Other: No free air or free fluid. Musculoskeletal: Diffuse osseous demineralization. Multilevel degenerative disc and facet disease changes lumbar spine with degenerative grade 1 anterolisthesis L3-L4 IMPRESSION: Supraumbilical ventral hernia containing nonobstructed transverse colon and a small bowel loop. Multiple small bowel loops extend into an umbilical hernia without obstruction. Sigmoid diverticulosis without evidence of diverticulitis. Calcified uterine leiomyoma 3.0 cm diameter. No  acute intra-abdominal or intrapelvic abnormalities. Aortic Atherosclerosis (ICD10-I70.0). Electronically Signed   By: Lavonia Dana M.D.   On: 07/30/2022 12:59   CT Hip Right Wo Contrast  Result Date: 07/30/2022 CLINICAL DATA:  Fall at home. Found down. Hip pain, fracture suspected. EXAM: CT OF THE RIGHT HIP WITHOUT CONTRAST TECHNIQUE: Multidetector CT imaging of the right hip was performed according to the standard protocol. Multiplanar CT image reconstructions were also generated. RADIATION DOSE REDUCTION: This exam was performed according to the departmental dose-optimization program which includes automated exposure control, adjustment of the mA and/or kV according to patient size and/or use of iterative reconstruction technique. COMPARISON:  Radiographs 07/30/2022 and 07/28/2022. FINDINGS: Bones/Joint/Cartilage The bones are demineralized. There is an acute nondisplaced fracture of the right superior pubic ramus. There is a mildly displaced acute fracture of the right inferior pubic ramus. No definite acute fracture of the proximal femur is identified. There is no dislocation or evidence of femoral head osteonecrosis. A small right hip joint effusion is present. There are mild degenerative changes at the right hip, sacroiliac joint and symphysis pubis. Ligaments Suboptimally assessed by CT. Muscles and Tendons No evidence of intramuscular hematoma or significant atrophy. Soft tissues Generalized subcutaneous edema surrounding the right hip without focal fluid collection, foreign body or soft tissue emphysema. Calcified uterine fibroids are noted. A large ventral abdominal wall hernia containing multiple loops of bowel is only partially imaged. There are diverticular changes within the sigmoid colon and diffuse aortoiliac atherosclerosis. IMPRESSION: 1. Acute fractures of the right superior and inferior pubic rami. 2. No evidence of acute proximal femur fracture or dislocation. Small nonspecific hip joint  effusion. 3. Mild degenerative changes as described. 4. Large ventral abdominal wall hernia containing multiple loops of bowel, incompletely visualized. Electronically Signed   By: Richardean Sale M.D.   On: 07/30/2022 11:03   CT Head Wo Contrast  Result Date: 07/30/2022 CLINICAL DATA:  Status post fall.  Headache. EXAM: CT HEAD WITHOUT CONTRAST TECHNIQUE: Contiguous axial images were obtained from the base of the skull through the vertex without intravenous contrast. RADIATION DOSE REDUCTION: This exam was performed according to the departmental dose-optimization program which includes automated exposure control, adjustment of the mA and/or kV according to patient size and/or use of iterative reconstruction technique. COMPARISON:  None Available. FINDINGS: Brain: Ventricles and sulci are prominent compatible with atrophy. No evidence for acute cortical based infarct, intracranial hemorrhage, mass lesion or mass effect. Periventricular and subcortical white matter hypodensities compatible with chronic microvascular ischemic changes. Vascular: Unremarkable Skull: Intact. Sinuses/Orbits: Paranasal sinuses are well aerated. Mastoid air cells are unremarkable. Other:  None. IMPRESSION: No acute intracranial process. Electronically Signed   By: Lovey Newcomer M.D.   On: 07/30/2022 07:50   DG Knee Complete 4 Views Right  Result Date: 07/30/2022 CLINICAL DATA:  88 year old female status post fall with pain. EXAM: RIGHT KNEE - COMPLETE 4+ VIEW COMPARISON:  Right knee series 07/28/2022 and earlier. FINDINGS: Severe tricompartmental joint space loss and bulky degenerative spurring with coarsely calcified 3.5 cm suprapatellar loose body redemonstrated. No superimposed acute fracture or dislocation identified. Stable since 07/21/2022. IMPRESSION: 1. No acute fracture or dislocation identified about the right knee. 2. Very severe tricompartmental degenerative changes with stable chronic suprapatellar loose body. Electronically  Signed   By: Genevie Ann M.D.   On: 07/30/2022 07:43   DG Hip Unilat W or Wo Pelvis 2-3 Views Right  Result Date: 07/30/2022 CLINICAL DATA:  87 year old female status post fall with right hip pain. EXAM: DG HIP (WITH OR WITHOUT PELVIS) 2-3V RIGHT COMPARISON:  Right femur series and pelvis 07/28/2022. FINDINGS: Femoral heads remain normally located. Pelvis appears stable and intact. Grossly stable proximal left femur. Dystrophic uterine fibroid calcification in the pelvis is stable. Negative visible bowel gas. Subtle irregularity of the right femoral neck compared to the left side. Intertrochanteric segment appears to remain intact. Proximal right femoral shaft appears intact. IMPRESSION: 1. Subtle irregularity of the right femoral neck stable since 07/28/2022. Difficult to exclude nondisplaced fracture. If the patient is unable to weightbear MRI is the preferred modality for further evaluation. 2.  No acute fracture or dislocation identified in the pelvis. Electronically Signed   By: Genevie Ann M.D.   On: 07/30/2022 07:41   DG Pelvis 1-2 Views  Result Date: 07/29/2022 CLINICAL DATA:  Pelvic/groin pain after a fall. EXAM: PELVIS - 1-2 VIEW COMPARISON:  None Available. FINDINGS: There is no evidence of pelvic fracture or diastasis. No pelvic bone lesions are seen. Degenerative changes are seen in the spine. IMPRESSION: No acute osseous injury. Electronically Signed   By: Zerita Boers M.D.   On: 07/29/2022 14:54   DG FEMUR, MIN 2 VIEWS RIGHT  Result Date: 07/29/2022 CLINICAL DATA:  Right thigh pain after a fall. EXAM: RIGHT FEMUR 2 VIEWS COMPARISON:  None Available. FINDINGS: There is no evidence of fracture or other focal bone lesions involving the femur. Soft tissues are unremarkable. IMPRESSION: No acute osseous injury of the femur. Electronically Signed   By: Zerita Boers M.D.   On: 07/29/2022 14:53   DG Knee 1-2 Views Right  Result Date: 07/29/2022 CLINICAL DATA:  Knee pain after a fall. EXAM: RIGHT KNEE  - 1-2 VIEW COMPARISON:  Knee radiographs dated 07/21/2022. FINDINGS: There is depression of the lateral tibial plateau which appears chronic, however the bones are diffusely demineralized and an acute impaction fracture is difficult to exclude. No acute displaced fracture is identified. 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 A calcification in the suprapatellar region likely represents a calcified loose body measuring 3.9 cm in size. There is no knee joint effusion IMPRESSION: Chronic appearing depression of the lateral tibial plateau, however the bones are diffusely demineralized and an acute impaction fracture is difficult to exclude. Electronically Signed   By: Zerita Boers M.D.   On: 07/29/2022 14:53    Procedures Procedures    Medications Ordered in ED Medications  sodium chloride 0.9 % bolus 1,000 mL (1,000 mLs Intravenous New Bag/Given 07/30/22 1029)  morphine (PF) 2 MG/ML injection 2 mg (2 mg Intravenous Given 07/30/22 1320)  ondansetron (ZOFRAN) injection 4  mg (4 mg Intravenous Given 07/30/22 1320)  iohexol (OMNIPAQUE) 300 MG/ML solution 75 mL (75 mLs Intravenous Contrast Given 07/30/22 1250)    ED Course/ Medical Decision Making/ A&P Clinical Course as of 07/30/22 1430  Fri Jul 30, 2022  1030 Attempted to ambulate patient at bedside with RN.  Patient unable to ambulate or march in place due to pain. [SB]  1136 Consult with orthopedic surgery PA-C, Hilbert Odor who recommends medical admission for pain control and physical therapy.  Notes the patient can weight-bear as tolerated and can follow-up in the outpatient setting with orthopedics when she is discharged from the hospital. [SB]  1139 Consult with hospitalist, Dr. [SB]  1149 Discussed with patient and daughter regarding CT imaging findings and recommendations from orthopedist.  Discussed with patient we will likely admit to the hospital.  Answered all available questions.  Patient and daughter agreeable at this  time. [SB]  32 Pt notes that she has a history of a hernia that has been present for years and she intermittently has abdominal pain. Hernia was status post bypass surgery. No GI specialist at this time. No TTP on exam today. [SB]  1350 Re-evaluated and patient noted improvement of symptoms with treatment regimen in the ED.  [SB]  1429 Consult with Hospitalist, Dr. Lorin Mercy who will evaluate the patient for admission. [SB]    Clinical Course User Index [SB] Kiira Brach A, PA-C                           Medical Decision Making Amount and/or Complexity of Data Reviewed Labs: ordered. Radiology: ordered.  Risk Prescription drug management. Decision regarding hospitalization.   Pt with fall occurring last night that was unwitnessed. Denies LOC, vomiting.  Patient afebrile.  On exam patient with no spinal tenderness to palpation.  No tenderness to palpation noted to musculature of back.  She has a sensation intact to bilateral upper and lower extremities.  Tenderness to palpation noted to right anterior hip with normal flexion and extension.  Tenderness to palpation noted to right proximal tibial region without obvious deformity.  Normal flexion and extension of right hip with mild tenderness to palpation.  Differential diagnosis includes fracture, dislocation, intracranial abnormality, arrhythmia, anemia, electrolyte abnormality.   Additional history obtained:  Additional history obtained from Daughter/Son  Labs:  I ordered, and personally interpreted labs.  The pertinent results include:   CBC without leukocytosis CMP unremarkable Urinalysis with small amount of leukocytes otherwise unremarkable CK unremarkable  Imaging: I ordered imaging studies including  right hip, right knee X-ray CT head without, CT right hip I independently visualized and interpreted imaging which showed: Right knee with concerns for tricompartmental arthritis.  Question stable right femoral neck dislocation.   CT head without acute intracranial abnormality.  CT right hip with concerns for  1. Acute fractures of the right superior and inferior pubic rami.  2. No evidence of acute proximal femur fracture or dislocation.  Small nonspecific hip joint effusion.  3. Mild degenerative changes as described.  4. Large ventral abdominal wall hernia containing multiple loops of  bowel, incompletely visualized.   I agree with the radiologist interpretation  Medications:  I ordered medication including Zofran and Morphine for pain management Reevaluation of the patient after these medicines and interventions, I reevaluated the patient and found that they have improved I have reviewed the patients home medicines and have made adjustments as needed   Consultations: I requested consultation with  the Orthopedist, Hilbert Odor, PA-C and discussed lab and imaging findings as well as pertinent plan - they recommend: Medical admission for pain control and physical therapy.  Patient can weight-bear as tolerated.  Recommended patient call the orthopedist to reschedule their outpatient appointment. -Consultation with hospitalist, Dr. Lorin Mercy who will evaluate the patient for admission.   Disposition: Presentation suspicious for acute fracture of right pubic rami.  Doubt dislocation at this time.  Doubt intracranial abnormality, arrhythmia, anemia, electrolyte abnormality. After consideration of the diagnostic results and the patients response to treatment, I feel that the patient would benefit from Admission to the hospital.  Discussed with patient and daughter at bedside regarding medical admission.  Answered all available questions.  Patient agreeable to admission at this time.  Patient appears safe for admission at this time.   This chart was dictated using voice recognition software, Dragon. Despite the best efforts of this provider to proofread and correct errors, errors may still occur which can change  documentation meaning.   Final Clinical Impression(s) / ED Diagnoses Final diagnoses:  Fall, initial encounter  Closed fracture of multiple pubic rami, right, initial encounter [facility])    Rx / DC Orders ED Discharge Orders     None         Leyla Soliz A, PA-C 07/30/22 1430    Mesner, Corene Cornea, MD 08/03/22 2344

## 2022-07-31 DIAGNOSIS — S32599A Other specified fracture of unspecified pubis, initial encounter for closed fracture: Secondary | ICD-10-CM | POA: Diagnosis not present

## 2022-07-31 LAB — URINE CULTURE
MICRO NUMBER:: 13859039
SPECIMEN QUALITY:: ADEQUATE

## 2022-07-31 LAB — BASIC METABOLIC PANEL
Anion gap: 5 (ref 5–15)
Anion gap: 9 (ref 5–15)
BUN: 8 mg/dL (ref 8–23)
BUN: 10 mg/dL (ref 8–23)
CO2: 23 mmol/L (ref 22–32)
CO2: 20 mmol/L — ABNORMAL LOW (ref 22–32)
Calcium: 8.9 mg/dL (ref 8.9–10.3)
Calcium: 9 mg/dL (ref 8.9–10.3)
Chloride: 113 mmol/L — ABNORMAL HIGH (ref 98–111)
Chloride: 111 mmol/L (ref 98–111)
Creatinine, Ser: 0.92 mg/dL (ref 0.44–1.00)
Creatinine, Ser: 0.95 mg/dL (ref 0.44–1.00)
GFR, Estimated: 60 mL/min — ABNORMAL LOW (ref >60)
GFR, Estimated: 58 mL/min — ABNORMAL LOW (ref >60)
Glucose, Bld: 88 mg/dL (ref 70–99)
Glucose, Bld: 78 mg/dL (ref 70–99)
Potassium: 4.1 mmol/L (ref 3.5–5.1)
Potassium: 4.1 mmol/L (ref 3.5–5.1)
Sodium: 141 mmol/L (ref 135–145)
Sodium: 140 mmol/L (ref 135–145)

## 2022-07-31 LAB — CBC
HCT: 30.5 % — ABNORMAL LOW (ref 36.0–46.0)
HCT: 32.8 % — ABNORMAL LOW (ref 36.0–46.0)
Hemoglobin: 10 g/dL — ABNORMAL LOW (ref 12.0–15.0)
Hemoglobin: 10.5 g/dL — ABNORMAL LOW (ref 12.0–15.0)
MCH: 26 pg (ref 26.0–34.0)
MCH: 25.7 pg — ABNORMAL LOW (ref 26.0–34.0)
MCHC: 32.8 g/dL (ref 30.0–36.0)
MCHC: 32 g/dL (ref 30.0–36.0)
MCV: 79.4 fL — ABNORMAL LOW (ref 80.0–100.0)
MCV: 80.4 fL (ref 80.0–100.0)
Platelets: 144 10*3/uL — ABNORMAL LOW (ref 150–400)
Platelets: 166 10*3/uL (ref 150–400)
RBC: 3.84 MIL/uL — ABNORMAL LOW (ref 3.87–5.11)
RBC: 4.08 MIL/uL (ref 3.87–5.11)
RDW: 15.8 % — ABNORMAL HIGH (ref 11.5–15.5)
RDW: 15.6 % — ABNORMAL HIGH (ref 11.5–15.5)
WBC: 6.4 10*3/uL (ref 4.0–10.5)
WBC: 6.9 10*3/uL (ref 4.0–10.5)
nRBC: 0 % (ref 0.0–0.2)
nRBC: 0 % (ref 0.0–0.2)

## 2022-07-31 LAB — MAGNESIUM: Magnesium: 1.9 mg/dL (ref 1.7–2.4)

## 2022-07-31 LAB — VITAMIN D 25 HYDROXY (VIT D DEFICIENCY, FRACTURES): Vit D, 25-Hydroxy: 29.18 ng/mL — ABNORMAL LOW (ref 30–100)

## 2022-07-31 MED ORDER — HYDRALAZINE HCL 20 MG/ML IJ SOLN: 10.0000 mg | INTRAMUSCULAR | Status: DC | PRN

## 2022-07-31 MED ORDER — DOCUSATE SODIUM 100 MG PO CAPS
100.0000 mg | ORAL_CAPSULE | Freq: Two times a day (BID) | ORAL | Status: DC
  Administered 2022-07-31 – 2022-08-03 (×6): 100 mg via ORAL
  Filled 2022-07-31 (×6): qty 1

## 2022-07-31 MED ORDER — IPRATROPIUM-ALBUTEROL 0.5-2.5 (3) MG/3ML IN SOLN: 3.0000 mL | RESPIRATORY_TRACT | Status: DC | PRN

## 2022-07-31 MED ORDER — GUAIFENESIN 100 MG/5ML PO LIQD: 5.0000 mL | ORAL | Status: DC | PRN

## 2022-07-31 MED ORDER — METOPROLOL TARTRATE 5 MG/5ML IV SOLN: 5.0000 mg | INTRAVENOUS | Status: DC | PRN

## 2022-07-31 MED ORDER — TRAZODONE HCL 50 MG PO TABS: 50.0000 mg | ORAL_TABLET | Freq: Every evening | ORAL | Status: DC | PRN

## 2022-07-31 NOTE — Progress Notes (Signed)
Chaplain responded to Spiritual Care consult for delivery of Advance Directive.,  Pt was asleep and did not rouse the entire time Chaplain was in the room speaking with her daughter.  Chaplain explained the AD to the daughter who reported her neighbor who works for Hospice will help spearhead getting the document executed.  Chaplain engaged in ministry of presence with pt's daughter.   [place]

## 2022-07-31 NOTE — Evaluation (Signed)
Physical Therapy Evaluation Patient Details Name: HARLY PIPKINS MRN: [id number] DOB: [birth year] Today's Date: 07/31/2022  History of Present Illness  SAMRA PESCH is a 88 y.o. female with medical history significant of breast cancer, CKD, and HTN presenting with a fall.  She has been falling at home, complaining of more joint pain in the last month or so. She fell yesterday and was put to bed in the evening. Her granddaughter found her in the floor about 0400. She is not currently having pain. Pt with R superior and inferior rami fractures. Pt also with chronic ventral hernia.  Clinical Impression  Pt agreeable to physical therapy evaluation/treatment session. Pt requiring moderate to maximal assistance for bed mobility and transfers. Pt with severely limited static standing balance despite max cues and use of RW. Pt currently presents with functional limitations secondary to impairments listed in PT problem list. Pt to benefit from skilled, acute care physical therapy interventions to maximize her independence level and quality of life.       Recommendations for follow up therapy are one component of a multi-disciplinary discharge planning process, led by the attending physician.  Recommendations may be updated based on patient status, additional functional criteria and insurance authorization.  Follow Up Recommendations Skilled nursing-short term rehab (<3 hours/day) Can patient physically be transported by private vehicle: No    Assistance Recommended at Discharge Frequent or constant Supervision/Assistance  Patient can return home with the following  A lot of help with walking and/or transfers;A lot of help with bathing/dressing/bathroom;Assistance with cooking/housework;Assistance with feeding;Direct supervision/assist for medications management;Direct supervision/assist for financial management;Assist for transportation;Help with stairs or ramp for entrance    Equipment Recommendations   (defer to SNF)  Recommendations for Other Services       Functional Status Assessment Patient has had a recent decline in their functional status and demonstrates the ability to make significant improvements in function in a reasonable and predictable amount of time.     Precautions / Restrictions Precautions Precautions: Fall Restrictions Weight Bearing Restrictions: Yes RLE Weight Bearing: Weight bearing as tolerated LLE Weight Bearing: Weight bearing as tolerated      Mobility  Bed Mobility Overal bed mobility: Needs Assistance Bed Mobility: Rolling, Supine to Sit, Sit to Supine Rolling: Mod assist   Supine to sit: Mod assist Sit to supine: Max assist   General bed mobility comments: Pt required more than reasonable time to complete bed mobility. Pt performed scooting at EOB (L to R) with moderate assistance. Pt performed rolling to each side for sling pad adjustment following session with max verbal and tactile cues. Pt then needed max assist x 2 to scoot higher up in bed. Pain increased at times as pt grimacing.    Transfers Overall transfer level: Needs assistance Equipment used: Rolling walker (2 wheels) Transfers: Sit to/from Stand Sit to Stand: Mod assist, Max assist           General transfer comment: Pt completed 2 sit to stands during encounter. Pt required cues for sequencing and technique. Pt with poor ability to obtain adequate knee flexion bilaterally prior to standing. Pt with significant retropulsion requiring maximal assistance to maintain static standing balance.    Ambulation/Gait               General Gait Details: unable  Stairs            Wheelchair Mobility    Modified Rankin (Stroke Patients Only)       Balance Overall balance  assessment: Needs assistance Sitting-balance support: Bilateral upper extremity supported, No upper extremity supported, Feet supported Sitting balance-Leahy Scale: Fair     Standing balance  support: Bilateral upper extremity supported, Reliant on assistive device for balance Standing balance-Leahy Scale: Zero Standing balance comment: Requires max assist                             Pertinent Vitals/Pain Pain Assessment Pain Assessment: 0-10 Pain Score:  ("barely" unable to quantify) Pain Location: pelvis and abdomen Pain Descriptors / Indicators: Discomfort Pain Intervention(s): Monitored during session, Limited activity within patient's tolerance    Home Living Family/patient expects to be discharged to:: Private residence Living Arrangements: Children (with daughter and grandchildren) Available Help at Discharge: Family;Available 24 hours/day Type of Home: House Home Access: Stairs to enter Entrance Stairs-Rails: None Entrance Stairs-Number of Steps: 1   Home Layout: One level Home Equipment: BSC/3in1;Shower seat;Wheelchair Probation officer (4 wheels);Rolling Walker (2 wheels);Cane - single point Additional Comments: Pt uses BSC in bedroom    Prior Function Prior Level of Function : Needs assist             Mobility Comments: Uses cane at baseline but DTR trying to get her to use walker (RW or rollator). Pt has had more difficulty getting up and down recently. Mostly walks household distances but can walk short distances in community such as when going to church. ADLs Comments: Pt independent with feeding, bathing (set up assist), and dressing. Pt's family administers her meds. Pt does not perform her errands. Pt able to fold clothes but does not formally do her laundry. Pt does not drive.     Hand Dominance        Extremity/Trunk Assessment   Upper Extremity Assessment Upper Extremity Assessment: Defer to OT evaluation    Lower Extremity Assessment Lower Extremity Assessment:  (Grossly 3-/5 major muscle groups B LE's)       Communication   Communication: No difficulties  Cognition Arousal/Alertness: Awake/alert Behavior During  Therapy: Flat affect Overall Cognitive Status: Impaired/Different from baseline Area of Impairment: Orientation, Following commands, Safety/judgement, Awareness, Problem solving                 Orientation Level: Disoriented to, Place, Time, Situation     Following Commands: Follows one step commands inconsistently, Follows one step commands with increased time Safety/Judgement: Decreased awareness of safety, Decreased awareness of deficits Awareness: Emergent Problem Solving: Slow processing, Requires verbal cues, Requires tactile cues, Difficulty sequencing, Decreased initiation General Comments: Pt with confusion and is a poor historian. Pt's DTR answering questions for her.        General Comments General comments (skin integrity, edema, etc.): HR and SpO2 stable on RA    Exercises     Assessment/Plan    PT Assessment Patient needs continued PT services  PT Problem List Decreased range of motion;Decreased strength;Decreased activity tolerance;Decreased balance;Decreased mobility;Decreased cognition;Decreased safety awareness;Pain       PT Treatment Interventions DME instruction;Gait training;Functional mobility training;Therapeutic activities;Therapeutic exercise;Balance training;Neuromuscular re-education;Cognitive remediation;Patient/family education;Modalities    PT Goals (Current goals can be found in the Care Plan section)  Acute Rehab PT Goals Patient Stated Goal: none stated PT Goal Formulation: With patient/family Time For Goal Achievement: 08/13/22 Potential to Achieve Goals: Fair    Frequency Min 3X/week     Co-evaluation               AM-PAC PT "6 Clicks" Mobility  Outcome  Measure Help needed turning from your back to your side while in a flat bed without using bedrails?: A Lot Help needed moving from lying on your back to sitting on the side of a flat bed without using bedrails?: A Lot Help needed moving to and from a bed to a chair  (including a wheelchair)?: Total Help needed standing up from a chair using your arms (e.g., wheelchair or bedside chair)?: Total Help needed to walk in hospital room?: Total Help needed climbing 3-5 steps with a railing? : Total 6 Click Score: 8    End of Session Equipment Utilized During Treatment: Gait belt Activity Tolerance: Patient limited by pain;Patient limited by fatigue Patient left: in bed;with call bell/phone within reach;with bed alarm set;with family/visitor present Nurse Communication: Mobility status;Weight bearing status PT Visit Diagnosis: Other abnormalities of gait and mobility (R26.89);History of falling (Z91.81);Muscle weakness (generalized) (M62.81);Pain Pain - Right/Left: Right Pain - part of body: Hip    Time: 7680-8811 PT Time Calculation (min) (ACUTE ONLY): 52 min   Charges:   PT Evaluation $PT Eval Moderate Complexity: 1 Mod PT Treatments $Therapeutic Activity: 23-37 mins        Donna Bernard, PT   [facility] 07/31/2022, 9:54 AM

## 2022-07-31 NOTE — Evaluation (Signed)
Occupational Therapy Evaluation Patient Details Name: Tracy Faulkner MRN: [id number] DOB: September 09, 1932 Today's Date: 07/31/2022   History of Present Illness Tracy Faulkner is a 88 y.o. female with medical history significant of breast cancer, CKD, and HTN presenting with a fall.  She has been falling at home, complaining of more joint pain in the last month or so. She fell yesterday and was put to bed in the evening. Her granddaughter found her in the floor about 0400. She is not currently having pain. Pt with R superior and inferior rami fractures. Pt also with chronic ventral hernia.   Clinical Impression   PTA, pt lives with family, typically ambulatory with cane (though walker use has been encouraged), and able to complete ADLs with overall setup assist. Pt presents now with deficits in pain (RLE>LLE), standing balance, cognition, strength and overall endurance. Due to posterior lean using RW with PT earlier, trialed [place] during OT session. Pt able to stand in [place] with Min A for transfer to recliner. Pt requires Min A for UB ADLs and Max A x 2 (if in standing) for LB ADLs. Based on requiring increased physical assistance and below functional baseline, recommend SNF rehab prior to return home.       Recommendations for follow up therapy are one component of a multi-disciplinary discharge planning process, led by the attending physician.  Recommendations may be updated based on patient status, additional functional criteria and insurance authorization.   Follow Up Recommendations  Skilled nursing-short term rehab (<3 hours/day)    Assistance Recommended at Discharge Frequent or constant Supervision/Assistance  Patient can return home with the following Two people to help with walking and/or transfers;A lot of help with bathing/dressing/bathroom    Functional Status Assessment  Patient has had a recent decline in their functional status and demonstrates the ability to make significant  improvements in function in a reasonable and predictable amount of time.  Equipment Recommendations  None recommended by OT    Recommendations for Other Services       Precautions / Restrictions Precautions Precautions: Fall Restrictions Weight Bearing Restrictions: Yes RLE Weight Bearing: Weight bearing as tolerated LLE Weight Bearing: Weight bearing as tolerated      Mobility Bed Mobility Overal bed mobility: Needs Assistance Bed Mobility: Supine to Sit     Supine to sit: Mod assist, HOB elevated     General bed mobility comments: problem solving use of bedrail; able to lift trunk and scoot with cues. assist for LE mgmt    Transfers Overall transfer level: Needs assistance Equipment used: Ambulation equipment used Transfers: Sit to/from Stand, Bed to chair/wheelchair/BSC Sit to Stand: Min assist           General transfer comment: able to stand from bed with cues for Stedy use (pillow placed to cushion chronically painful knees). Transferred to chair via Stedy and pt able to return demo standing from recliner height with min A as well Transfer via Lift Equipment: Stedy    Balance Overall balance assessment: Needs assistance Sitting-balance support: Bilateral upper extremity supported, No upper extremity supported, Feet supported Sitting balance-Leahy Scale: Fair     Standing balance support: Bilateral upper extremity supported, Reliant on assistive device for balance Standing balance-Leahy Scale: Poor                             ADL either performed or assessed with clinical judgement   ADL Overall ADL's : Needs assistance/impaired  Eating/Feeding: Independent   Grooming: Sitting;Supervision/safety   Upper Body Bathing: Minimal assistance;Sitting   Lower Body Bathing: Maximal assistance;+2 for physical assistance;Sit to/from stand   Upper Body Dressing : Minimal assistance;Sitting   Lower Body Dressing: Maximal assistance;+2 for physical  assistance;Sit to/from stand       Toileting- Water quality scientist and Hygiene: Maximal assistance;+2 for physical assistance;Sit to/from stand         General ADL Comments: Limited by baseline cognitive deficits and pain. requires increased assist with RW trial per earlier PT session. Trialed Stedy with pt able to demo improvement in standing abilities     Vision Baseline Vision/History: 1 Wears glasses Ability to See in Adequate Light: 0 Adequate Patient Visual Report: No change from baseline Vision Assessment?: No apparent visual deficits     Perception     Praxis      Pertinent Vitals/Pain Pain Assessment Pain Assessment: Faces Faces Pain Scale: Hurts little more Pain Location: R LE> LLE and chronic knee pain Pain Descriptors / Indicators: Discomfort Pain Intervention(s): Monitored during session     Hand Dominance Right   Extremity/Trunk Assessment Upper Extremity Assessment Upper Extremity Assessment: Generalized weakness   Lower Extremity Assessment Lower Extremity Assessment: Defer to PT evaluation   Cervical / Trunk Assessment Cervical / Trunk Assessment: Kyphotic   Communication Communication Communication: No difficulties   Cognition Arousal/Alertness: Awake/alert Behavior During Therapy: Flat affect Overall Cognitive Status: Impaired/Different from baseline Area of Impairment: Orientation, Following commands, Safety/judgement, Awareness, Problem solving                 Orientation Level: Disoriented to, Place, Time, Situation     Following Commands: Follows one step commands inconsistently, Follows one step commands with increased time Safety/Judgement: Decreased awareness of safety, Decreased awareness of deficits Awareness: Emergent Problem Solving: Slow processing, Requires verbal cues, Requires tactile cues, Difficulty sequencing, Decreased initiation General Comments: able to recall some of PLOF accurately but reports month as January  and year as '92     General Comments  HR and SpO2 stable on RA    Exercises     Shoulder Instructions      Home Living Family/patient expects to be discharged to:: Private residence Living Arrangements: Children;Other relatives (grandchildren) Available Help at Discharge: Family;Available 24 hours/day Type of Home: House Home Access: Stairs to enter [employer] of Steps: 1 Entrance Stairs-Rails: None Home Layout: One level     Bathroom Shower/Tub: Sponge bathes at baseline   [employer]: Standard     Home Equipment: BSC/3in1;Shower seat;Wheelchair Probation officer (4 wheels);Rolling Walker (2 wheels);Cane - single point   Additional Comments: Pt uses BSC in bedroom      Prior Functioning/Environment Prior Level of Function : Needs assist             Mobility Comments: Uses cane at baseline but DTR trying to get her to use walker (RW or rollator). Pt has had more difficulty getting up and down recently. Mostly walks household distances but can walk short distances in community such as when going to church. ADLs Comments: Pt independent with feeding, bathing (set up assist), and dressing. Pt's family administers her meds. Pt does not perform her errands. Pt able to fold clothes but does not formally do her laundry. Pt does not drive.        OT Problem List: Decreased strength;Decreased activity tolerance;Impaired balance (sitting and/or standing);Decreased cognition;Decreased safety awareness;Decreased knowledge of use of DME or AE;Decreased knowledge of precautions;Pain  OT Treatment/Interventions: Self-care/ADL training;Therapeutic exercise;Energy conservation;DME and/or AE instruction;Therapeutic activities;Patient/family education;Balance training    OT Goals(Current goals can be found in the care plan section) Acute Rehab OT Goals Patient Stated Goal: pain improvements OT Goal Formulation: With patient Time For Goal Achievement:  08/14/22 Potential to Achieve Goals: Good  OT Frequency: Min 2X/week    Co-evaluation              AM-PAC OT "6 Clicks" Daily Activity     Outcome Measure Help from another person eating meals?: A Little Help from another person taking care of personal grooming?: A Little Help from another person toileting, which includes using toliet, bedpan, or urinal?: A Lot Help from another person bathing (including washing, rinsing, drying)?: A Lot Help from another person to put on and taking off regular upper body clothing?: A Little Help from another person to put on and taking off regular lower body clothing?: A Lot 6 Click Score: 15   End of Session Equipment Utilized During Treatment: Gait belt Nurse Communication: Mobility status  Activity Tolerance: Patient tolerated treatment well Patient left: in chair;with call bell/phone within reach;with chair alarm set  OT Visit Diagnosis: Unsteadiness on feet (R26.81);Other abnormalities of gait and mobility (R26.89)                Time: 5956-3875 OT Time Calculation (min): 30 min Charges:  OT General Charges $OT Visit: 1 Visit OT Evaluation $OT Eval Low Complexity: 1 Low OT Treatments $Therapeutic Activity: 8-22 mins  Tracy Faulkner, OTR/L Acute Rehab Services Office: [phone]   Layla Maw 07/31/2022, 11:23 AM

## 2022-07-31 NOTE — Progress Notes (Signed)
PROGRESS NOTE    Tracy Faulkner  LYY:[id number] DOB: 1932-01-04 DOA: 07/30/2022 PCP: Sandrea Hughs, NP   Brief Narrative:   88 y.o. female with medical history significant of breast cancer and HTN presenting with a fall.  She has been falling at home, complaining of more joint pain in the last month or so.  She fell yesterday and was put to bed in the evening.  Her granddaughter found her in the floor about 0400. CT shows R pubic rami fractures and ventral hernia (chronic and stable on CT).   Needs pain control, PT/OT, ortho outpatient f/u.    Assessment & Plan:  Principal Problem:   Closed fracture of multiple pubic rami, initial encounter ([place]) Active Problems:   Hypertension   MCI (mild cognitive impairment)   Chronic kidney disease, stage 3a (HCC)   DNR (do not resuscitate)     Pelvic fractures Acute Superior inferior pubic rami fracture -ED provider spoke with orthopedic, recommending conservative management and outpatient follow-up.  PT/OT, pain control, bowel regimen.  Check vitamin D   HTN -Continue losartan   Mild cognitive impairment Supportive care   Stage 3a CKD -Renal function stable  Nonobstructive ventral hernia - Supportive care  Anemia chronic disease - Baseline is evidence of bleeding   DNR -CODE STATUS discussed by admitted with the patient and family, confirmed DNR      DVT prophylaxis: Lovenox Code Status: DNR Family Communication: Daughter present at bedside during my evaluation  Pain control, PT/OT evaluation.  Thereafter needs safe disposition  Subjective:  Patient eating and during my evaluation this morning.  At rest she is comfortable.  She is getting ready to work with physical therapy. Prior to this event patient was ambulatory with the help of walker within her home.   Examination:  General exam: Appears calm and comfortable, elderly frail Respiratory system: Clear to auscultation. Respiratory effort normal. Cardiovascular  system: S1 & S2 heard, RRR. No JVD, murmurs, rubs, gallops or clicks. No pedal edema. Gastrointestinal system: Abdomen is nondistended, soft and nontender. No organomegaly or masses felt. Normal bowel sounds heard. Central nervous system: Alert and oriented. No focal neurological deficits. Extremities: Symmetric 4+ x 5 power. Skin: No rashes, lesions or ulcers Psychiatry: Judgement and insight appear normal. Mood & affect appropriate.     Objective: Vitals:   07/30/22 1655 07/30/22 2041 07/30/22 2340 07/31/22 0437  BP: (!) 188/101 (!) 140/89 (!) 148/92 (!) 141/80  Pulse: 72 80 81 65  Resp:  '16 16 15  '$ Temp: 97.6 F (36.4 C) 98.2 F (36.8 C)  97.6 F (36.4 C)  TempSrc: Oral Oral    SpO2: 100% 100% 98% 100%    Intake/Output Summary (Last 24 hours) at 07/31/2022 0655 Last data filed at 07/31/2022 0500 Gross per 24 hour  Intake 1240 ml  Output 750 ml  Net 490 ml   There were no vitals filed for this visit.   Data Reviewed:   CBC: Recent Labs  Lab 07/28/22 1507 07/30/22 0626 07/31/22 0108  WBC 5.3 5.8 6.4  NEUTROABS 3,525 3.9  --   HGB 10.8* 10.6* 10.0*  HCT 34.3* 34.1* 30.5*  MCV 81.1 82.6 79.4*  PLT 174 177 127*   Basic Metabolic Panel: Recent Labs  Lab 07/28/22 1507 07/30/22 0626 07/31/22 0108  NA 141 143 141  K 4.0 4.0 4.1  CL 109 110 113*  CO2 '23 23 23  '$ GLUCOSE 87 100* 88  BUN '15 14 8  '$ CREATININE 1.02* 1.01* 0.92  CALCIUM 9.8 9.8 8.9   GFR: Estimated Creatinine Clearance: 41.2 mL/min (by C-G formula based on SCr of 0.92 mg/dL). Liver Function Tests: Recent Labs  Lab 07/30/22 0626  AST 28  ALT 18  ALKPHOS 60  BILITOT 0.9  PROT 7.0  ALBUMIN 3.5   No results for input(s): "LIPASE", "AMYLASE" in the last 168 hours. No results for input(s): "AMMONIA" in the last 168 hours. Coagulation Profile: No results for input(s): "INR", "PROTIME" in the last 168 hours. Cardiac Enzymes: Recent Labs  Lab 07/30/22 0626  CKTOTAL 200   BNP (last 3  results) No results for input(s): "PROBNP" in the last 8760 hours. HbA1C: No results for input(s): "HGBA1C" in the last 72 hours. CBG: No results for input(s): "GLUCAP" in the last 168 hours. Lipid Profile: No results for input(s): "CHOL", "HDL", "LDLCALC", "TRIG", "CHOLHDL", "LDLDIRECT" in the last 72 hours. Thyroid Function Tests: No results for input(s): "TSH", "T4TOTAL", "FREET4", "T3FREE", "THYROIDAB" in the last 72 hours. Anemia Panel: No results for input(s): "VITAMINB12", "FOLATE", "FERRITIN", "TIBC", "IRON", "RETICCTPCT" in the last 72 hours. Sepsis Labs: No results for input(s): "PROCALCITON", "LATICACIDVEN" in the last 168 hours.  Recent Results (from the past 240 hour(s))  Urine Culture     Status: None (Preliminary result)   Collection Time: 07/29/22  3:47 PM   Specimen: Urine  Result Value Ref Range Status   MICRO NUMBER: [id number]  Preliminary   SPECIMEN QUALITY: Adequate  Preliminary   Sample Source URINE  Preliminary   STATUS: PRELIMINARY  Preliminary   Result: Culture in progress  Preliminary         Radiology Studies: CT ABDOMEN PELVIS W CONTRAST  Result Date: 07/30/2022 CLINICAL DATA:  Complicated ventral hernia, fall at home, found on floor by family, struck head on wall, RIGHT leg pain EXAM: CT ABDOMEN AND PELVIS WITH CONTRAST TECHNIQUE: Multidetector CT imaging of the abdomen and pelvis was performed using the standard protocol following bolus administration of intravenous contrast. RADIATION DOSE REDUCTION: This exam was performed according to the departmental dose-optimization program which includes automated exposure control, adjustment of the mA and/or kV according to patient size and/or use of iterative reconstruction technique. CONTRAST:  69m OMNIPAQUE IOHEXOL 300 MG/ML SOLN IV. No oral contrast. COMPARISON:  None Available. FINDINGS: Lower chest: Minimal bibasilar atelectasis. Minimal enlargement of cardiac chambers. Hepatobiliary: Post cholecystectomy.  Small hepatic cysts. Liver otherwise unremarkable. Pancreas: Normal appearance Spleen: Normal appearance Adrenals/Urinary Tract: Adrenal glands and RIGHT kidney normal appearance. Upper pole LEFT renal cyst 2.5 cm diameter; no follow-up imaging recommended. Kidneys, ureters, and bladder otherwise normal appearance Stomach/Bowel: Transverse colon and a small bowel loop extend into a supraumbilical ventral hernia without obstruction. Multiple small bowel loops extend into a umbilical hernia without obstruction. Sigmoid diverticulosis. Appendix not definitely visualized. Stomach and remaining bowel loops normal appearance. Perigastric surgical clips noted. Vascular/Lymphatic: Atherosclerotic calcifications aorta and iliac arteries without aneurysm. Vascular structures patent. Few normal sized mesenteric and retroperitoneal nodes without adenopathy. Reproductive: Calcified leiomyoma within uterus 3.0 cm diameter. Uterus and adnexa otherwise unremarkable Other: No free air or free fluid. Musculoskeletal: Diffuse osseous demineralization. Multilevel degenerative disc and facet disease changes lumbar spine with degenerative grade 1 anterolisthesis L3-L4 IMPRESSION: Supraumbilical ventral hernia containing nonobstructed transverse colon and a small bowel loop. Multiple small bowel loops extend into an umbilical hernia without obstruction. Sigmoid diverticulosis without evidence of diverticulitis. Calcified uterine leiomyoma 3.0 cm diameter. No acute intra-abdominal or intrapelvic abnormalities. Aortic Atherosclerosis (ICD10-I70.0). Electronically Signed   By: MLavonia Dana  M.D.   On: 07/30/2022 12:59   CT Hip Right Wo Contrast  Result Date: 07/30/2022 CLINICAL DATA:  Fall at home. Found down. Hip pain, fracture suspected. EXAM: CT OF THE RIGHT HIP WITHOUT CONTRAST TECHNIQUE: Multidetector CT imaging of the right hip was performed according to the standard protocol. Multiplanar CT image reconstructions were also generated.  RADIATION DOSE REDUCTION: This exam was performed according to the departmental dose-optimization program which includes automated exposure control, adjustment of the mA and/or kV according to patient size and/or use of iterative reconstruction technique. COMPARISON:  Radiographs 07/30/2022 and 07/28/2022. FINDINGS: Bones/Joint/Cartilage The bones are demineralized. There is an acute nondisplaced fracture of the right superior pubic ramus. There is a mildly displaced acute fracture of the right inferior pubic ramus. No definite acute fracture of the proximal femur is identified. There is no dislocation or evidence of femoral head osteonecrosis. A small right hip joint effusion is present. There are mild degenerative changes at the right hip, sacroiliac joint and symphysis pubis. Ligaments Suboptimally assessed by CT. Muscles and Tendons No evidence of intramuscular hematoma or significant atrophy. Soft tissues Generalized subcutaneous edema surrounding the right hip without focal fluid collection, foreign body or soft tissue emphysema. Calcified uterine fibroids are noted. A large ventral abdominal wall hernia containing multiple loops of bowel is only partially imaged. There are diverticular changes within the sigmoid colon and diffuse aortoiliac atherosclerosis. IMPRESSION: 1. Acute fractures of the right superior and inferior pubic rami. 2. No evidence of acute proximal femur fracture or dislocation. Small nonspecific hip joint effusion. 3. Mild degenerative changes as described. 4. Large ventral abdominal wall hernia containing multiple loops of bowel, incompletely visualized. Electronically Signed   By: Richardean Sale M.D.   On: 07/30/2022 11:03   CT Head Wo Contrast  Result Date: 07/30/2022 CLINICAL DATA:  Status post fall.  Headache. EXAM: CT HEAD WITHOUT CONTRAST TECHNIQUE: Contiguous axial images were obtained from the base of the skull through the vertex without intravenous contrast. RADIATION DOSE  REDUCTION: This exam was performed according to the departmental dose-optimization program which includes automated exposure control, adjustment of the mA and/or kV according to patient size and/or use of iterative reconstruction technique. COMPARISON:  None Available. FINDINGS: Brain: Ventricles and sulci are prominent compatible with atrophy. No evidence for acute cortical based infarct, intracranial hemorrhage, mass lesion or mass effect. Periventricular and subcortical white matter hypodensities compatible with chronic microvascular ischemic changes. Vascular: Unremarkable Skull: Intact. Sinuses/Orbits: Paranasal sinuses are well aerated. Mastoid air cells are unremarkable. Other: None. IMPRESSION: No acute intracranial process. Electronically Signed   By: Lovey Newcomer M.D.   On: 07/30/2022 07:50   DG Knee Complete 4 Views Right  Result Date: 07/30/2022 CLINICAL DATA:  88 year old female status post fall with pain. EXAM: RIGHT KNEE - COMPLETE 4+ VIEW COMPARISON:  Right knee series 07/28/2022 and earlier. FINDINGS: Severe tricompartmental joint space loss and bulky degenerative spurring with coarsely calcified 3.5 cm suprapatellar loose body redemonstrated. No superimposed acute fracture or dislocation identified. Stable since 07/21/2022. IMPRESSION: 1. No acute fracture or dislocation identified about the right knee. 2. Very severe tricompartmental degenerative changes with stable chronic suprapatellar loose body. Electronically Signed   By: Genevie Ann M.D.   On: 07/30/2022 07:43   DG Hip Unilat W or Wo Pelvis 2-3 Views Right  Result Date: 07/30/2022 CLINICAL DATA:  88 year old female status post fall with right hip pain. EXAM: DG HIP (WITH OR WITHOUT PELVIS) 2-3V RIGHT COMPARISON:  Right femur series and pelvis  07/28/2022. FINDINGS: Femoral heads remain normally located. Pelvis appears stable and intact. Grossly stable proximal left femur. Dystrophic uterine fibroid calcification in the pelvis is stable.  Negative visible bowel gas. Subtle irregularity of the right femoral neck compared to the left side. Intertrochanteric segment appears to remain intact. Proximal right femoral shaft appears intact. IMPRESSION: 1. Subtle irregularity of the right femoral neck stable since 07/28/2022. Difficult to exclude nondisplaced fracture. If the patient is unable to weightbear MRI is the preferred modality for further evaluation. 2.  No acute fracture or dislocation identified in the pelvis. Electronically Signed   By: Genevie Ann M.D.   On: 07/30/2022 07:41        Scheduled Meds:  docusate sodium  100 mg Oral BID   enoxaparin (LOVENOX) injection  40 mg Subcutaneous Q24H   losartan  50 mg Oral Daily   pantoprazole  40 mg Oral Daily   Continuous Infusions:   LOS: 0 days   Time spent= 35 mins    Kazi Reppond Arsenio Loader, MD [facility]  If 7PM-7AM, please contact night-coverage  07/31/2022, 6:55 AM

## 2022-07-31 NOTE — Plan of Care (Signed)

## 2022-08-01 DIAGNOSIS — S32599A Other specified fracture of unspecified pubis, initial encounter for closed fracture: Secondary | ICD-10-CM | POA: Diagnosis not present

## 2022-08-01 MED ORDER — CHOLECALCIFEROL 10 MCG (400 UNIT) PO TABS
400.0000 [IU] | ORAL_TABLET | Freq: Every day | ORAL | Status: DC
Start: 2022-08-01 — End: 2022-08-04
  Administered 2022-08-02 – 2022-08-03 (×2): 400 [IU] via ORAL
  Filled 2022-08-01 (×5): qty 1

## 2022-08-01 MED ORDER — ENSURE ENLIVE PO LIQD
237.0000 mL | Freq: Two times a day (BID) | ORAL | Status: DC
  Administered 2022-08-01 – 2022-08-03 (×5): 237 mL via ORAL

## 2022-08-01 MED ORDER — ADULT MULTIVITAMIN W/MINERALS CH
1.0000 | ORAL_TABLET | Freq: Every day | ORAL | Status: DC
  Administered 2022-08-01 – 2022-08-03 (×3): 1 via ORAL
  Filled 2022-08-01 (×3): qty 1

## 2022-08-01 NOTE — Progress Notes (Signed)
PROGRESS NOTE    Tracy Faulkner  JJK:[id number] DOB: 28-Nov-1932 DOA: 07/30/2022 PCP: Sandrea Hughs, NP   Brief Narrative:   88 y.o. female with medical history significant of breast cancer and HTN presenting with a fall.  She has been falling at home, complaining of more joint pain in the last month or so.  She fell yesterday and was put to bed in the evening.  Her granddaughter found her in the floor about 0400. CT shows R pubic rami fractures and ventral hernia (chronic and stable on CT).   Needs pain control, PT/OT, ortho outpatient f/u.  PT/OT has recommended SNF.  TOC consulted.   Assessment & Plan:  Principal Problem:   Closed fracture of multiple pubic rami, initial encounter ([place]) Active Problems:   Hypertension   MCI (mild cognitive impairment)   Chronic kidney disease, stage 3a (HCC)   DNR (do not resuscitate)     Pelvic fractures Acute Superior inferior pubic rami fracture -ED provider spoke with orthopedic, recommending conservative management and outpatient follow-up.  PT/OT, pain control, bowel regimen.  PT recommended SNF, TOC consulted for placement  Mild vitamin D deficiency - Supplements ordered   HTN -Continue losartan   Mild cognitive impairment Supportive care   Stage 3a CKD -Renal function stable  Nonobstructive ventral hernia - Supportive care  Anemia chronic disease - Baseline is evidence of bleeding   DNR -CODE STATUS discussed by admitted with the patient and family, confirmed DNR      DVT prophylaxis: Lovenox Code Status: DNR Family Communication: Daughter is at bedside  Patient doing better, pending placement  Subjective: Patient tells me at rest, no complaints.  With mobility she has mild discomfort as expected   Examination:  Constitutional: Not in acute distress, elderly frail Respiratory: Clear to auscultation bilaterally Cardiovascular: Normal sinus rhythm, no rubs Abdomen: Nontender nondistended good bowel  sounds Musculoskeletal: No edema noted Skin: No rashes seen Neurologic: CN 2-12 grossly intact.  And nonfocal Psychiatric: Normal judgment and insight. Alert and oriented x 3. Normal mood.    Objective: Vitals:   07/31/22 1130 07/31/22 1343 07/31/22 1417 07/31/22 1916  BP: (!) 154/94 (!) 177/98 (!) 177/98 128/74  Pulse: 67 71 71 84  Resp: '18 17 17 18  '$ Temp: 98.5 F (36.9 C) (!) 97.5 F (36.4 C)  98.9 F (37.2 C)  TempSrc: Oral Oral Oral   SpO2: 100% 100%  100%  Weight:   70.3 kg   Height:   5' 4.5" (1.638 m)     Intake/Output Summary (Last 24 hours) at 08/01/2022 0931 Last data filed at 07/31/2022 2035 Gross per 24 hour  Intake 120 ml  Output 600 ml  Net -480 ml   Filed Weights   07/31/22 1417  Weight: 70.3 kg     Data Reviewed:   CBC: Recent Labs  Lab 07/28/22 1507 07/30/22 0626 07/31/22 0108 07/31/22 0746  WBC 5.3 5.8 6.4 6.9  NEUTROABS 3,525 3.9  --   --   HGB 10.8* 10.6* 10.0* 10.5*  HCT 34.3* 34.1* 30.5* 32.8*  MCV 81.1 82.6 79.4* 80.4  PLT 174 177 144* 371   Basic Metabolic Panel: Recent Labs  Lab 07/28/22 1507 07/30/22 0626 07/31/22 0108 07/31/22 0746  NA 141 143 141 140  K 4.0 4.0 4.1 4.1  CL 109 110 113* 111  CO2 '23 23 23 '$ 20*  GLUCOSE 87 100* 88 78  BUN '15 14 8 10  '$ CREATININE 1.02* 1.01* 0.92 0.95  CALCIUM 9.8 9.8  8.9 9.0  MG  --   --   --  1.9   GFR: Estimated Creatinine Clearance: 39.9 mL/min (by C-G formula based on SCr of 0.95 mg/dL). Liver Function Tests: Recent Labs  Lab 07/30/22 0626  AST 28  ALT 18  ALKPHOS 60  BILITOT 0.9  PROT 7.0  ALBUMIN 3.5   No results for input(s): "LIPASE", "AMYLASE" in the last 168 hours. No results for input(s): "AMMONIA" in the last 168 hours. Coagulation Profile: No results for input(s): "INR", "PROTIME" in the last 168 hours. Cardiac Enzymes: Recent Labs  Lab 07/30/22 0626  CKTOTAL 200   BNP (last 3 results) No results for input(s): "PROBNP" in the last 8760 hours. HbA1C: No  results for input(s): "HGBA1C" in the last 72 hours. CBG: No results for input(s): "GLUCAP" in the last 168 hours. Lipid Profile: No results for input(s): "CHOL", "HDL", "LDLCALC", "TRIG", "CHOLHDL", "LDLDIRECT" in the last 72 hours. Thyroid Function Tests: No results for input(s): "TSH", "T4TOTAL", "FREET4", "T3FREE", "THYROIDAB" in the last 72 hours. Anemia Panel: No results for input(s): "VITAMINB12", "FOLATE", "FERRITIN", "TIBC", "IRON", "RETICCTPCT" in the last 72 hours. Sepsis Labs: No results for input(s): "PROCALCITON", "LATICACIDVEN" in the last 168 hours.  Recent Results (from the past 240 hour(s))  Urine Culture     Status: None   Collection Time: 07/29/22  3:47 PM   Specimen: Urine  Result Value Ref Range Status   MICRO NUMBER: [id number]  Final   SPECIMEN QUALITY: Adequate  Final   Sample Source URINE  Final   STATUS: FINAL  Final   ISOLATE 1:   Final    Greater than 100,000 CFU/mL of Non-uropathogenic Gram positive organism May represent colonizers from external and internal genitalia. No further testing (including susceptibility) will be performed.         Radiology Studies: CT ABDOMEN PELVIS W CONTRAST  Result Date: 07/30/2022 CLINICAL DATA:  Complicated ventral hernia, fall at home, found on floor by family, struck head on wall, RIGHT leg pain EXAM: CT ABDOMEN AND PELVIS WITH CONTRAST TECHNIQUE: Multidetector CT imaging of the abdomen and pelvis was performed using the standard protocol following bolus administration of intravenous contrast. RADIATION DOSE REDUCTION: This exam was performed according to the departmental dose-optimization program which includes automated exposure control, adjustment of the mA and/or kV according to patient size and/or use of iterative reconstruction technique. CONTRAST:  34m OMNIPAQUE IOHEXOL 300 MG/ML SOLN IV. No oral contrast. COMPARISON:  None Available. FINDINGS: Lower chest: Minimal bibasilar atelectasis. Minimal enlargement of  cardiac chambers. Hepatobiliary: Post cholecystectomy. Small hepatic cysts. Liver otherwise unremarkable. Pancreas: Normal appearance Spleen: Normal appearance Adrenals/Urinary Tract: Adrenal glands and RIGHT kidney normal appearance. Upper pole LEFT renal cyst 2.5 cm diameter; no follow-up imaging recommended. Kidneys, ureters, and bladder otherwise normal appearance Stomach/Bowel: Transverse colon and a small bowel loop extend into a supraumbilical ventral hernia without obstruction. Multiple small bowel loops extend into a umbilical hernia without obstruction. Sigmoid diverticulosis. Appendix not definitely visualized. Stomach and remaining bowel loops normal appearance. Perigastric surgical clips noted. Vascular/Lymphatic: Atherosclerotic calcifications aorta and iliac arteries without aneurysm. Vascular structures patent. Few normal sized mesenteric and retroperitoneal nodes without adenopathy. Reproductive: Calcified leiomyoma within uterus 3.0 cm diameter. Uterus and adnexa otherwise unremarkable Other: No free air or free fluid. Musculoskeletal: Diffuse osseous demineralization. Multilevel degenerative disc and facet disease changes lumbar spine with degenerative grade 1 anterolisthesis L3-L4 IMPRESSION: Supraumbilical ventral hernia containing nonobstructed transverse colon and a small bowel loop. Multiple small bowel loops extend  into an umbilical hernia without obstruction. Sigmoid diverticulosis without evidence of diverticulitis. Calcified uterine leiomyoma 3.0 cm diameter. No acute intra-abdominal or intrapelvic abnormalities. Aortic Atherosclerosis (ICD10-I70.0). Electronically Signed   By: Lavonia Dana M.D.   On: 07/30/2022 12:59   CT Hip Right Wo Contrast  Result Date: 07/30/2022 CLINICAL DATA:  Fall at home. Found down. Hip pain, fracture suspected. EXAM: CT OF THE RIGHT HIP WITHOUT CONTRAST TECHNIQUE: Multidetector CT imaging of the right hip was performed according to the standard protocol.  Multiplanar CT image reconstructions were also generated. RADIATION DOSE REDUCTION: This exam was performed according to the departmental dose-optimization program which includes automated exposure control, adjustment of the mA and/or kV according to patient size and/or use of iterative reconstruction technique. COMPARISON:  Radiographs 07/30/2022 and 07/28/2022. FINDINGS: Bones/Joint/Cartilage The bones are demineralized. There is an acute nondisplaced fracture of the right superior pubic ramus. There is a mildly displaced acute fracture of the right inferior pubic ramus. No definite acute fracture of the proximal femur is identified. There is no dislocation or evidence of femoral head osteonecrosis. A small right hip joint effusion is present. There are mild degenerative changes at the right hip, sacroiliac joint and symphysis pubis. Ligaments Suboptimally assessed by CT. Muscles and Tendons No evidence of intramuscular hematoma or significant atrophy. Soft tissues Generalized subcutaneous edema surrounding the right hip without focal fluid collection, foreign body or soft tissue emphysema. Calcified uterine fibroids are noted. A large ventral abdominal wall hernia containing multiple loops of bowel is only partially imaged. There are diverticular changes within the sigmoid colon and diffuse aortoiliac atherosclerosis. IMPRESSION: 1. Acute fractures of the right superior and inferior pubic rami. 2. No evidence of acute proximal femur fracture or dislocation. Small nonspecific hip joint effusion. 3. Mild degenerative changes as described. 4. Large ventral abdominal wall hernia containing multiple loops of bowel, incompletely visualized. Electronically Signed   By: Richardean Sale M.D.   On: 07/30/2022 11:03        Scheduled Meds:  cholecalciferol  400 Units Oral Daily   docusate sodium  100 mg Oral BID   enoxaparin (LOVENOX) injection  40 mg Subcutaneous Q24H   losartan  50 mg Oral Daily   pantoprazole   40 mg Oral Daily   Continuous Infusions:   LOS: 0 days   Time spent= 35 mins    Dorn Hartshorne Arsenio Loader, MD [facility]  If 7PM-7AM, please contact night-coverage  08/01/2022, 9:31 AM

## 2022-08-01 NOTE — Progress Notes (Signed)
Initial Nutrition Assessment  DOCUMENTATION CODES:   Not applicable  INTERVENTION:   -Ensure Enlive po BID, each supplement provides 350 kcal and 20 grams of protein -MVI with minerals daily  NUTRITION DIAGNOSIS:   Increased nutrient needs related to acute illness as evidenced by estimated needs.  GOAL:   Patient will meet greater than or equal to 90% of their needs  MONITOR:   PO intake, Supplement acceptance  REASON FOR ASSESSMENT:   Consult Assessment of nutrition requirement/status  ASSESSMENT:   Pt with medical history significant of breast cancer and HTN presenting with a fall.  Pt admitted with pelvic fractures.   Reviewed I/O's: -480 ml x 24 hours and +10 since admission  UOP: 600 ml x 24 hours   Pt unavailable at time of visit. Attempted to speak with pt via call to hospital room phone, however, unable to reach. RD unable to obtain further nutrition-related history or complete nutrition-focused physical exam at this time.    Pt currently on a regular diet. No meal completion data available to assess at this time.   Reviewed wt hx; pt has experienced a 4.4% wt loss over the past 9 months, which is not significant for time frame.   Pt with increased nutritional needs and would benefit from addition of oral nutrition supplements.   Medications reviewed and include colace.  Therapies recommending SNF at discharge.   Labs reviewed.   Diet Order:   Diet Order             Diet regular Room service appropriate? No; Fluid consistency: Thin  Diet effective now                   EDUCATION NEEDS:   No education needs have been identified at this time  Skin:  Skin Assessment: Reviewed RN Assessment  Last BM:  07/31/22  Height:   Ht Readings from Last 1 Encounters:  07/31/22 5' 4.5" (1.638 m)    Weight:   Wt Readings from Last 1 Encounters:  07/31/22 70.3 kg    Ideal Body Weight:  55.7 kg  BMI:  Body mass index is 26.2  kg/m.  Estimated Nutritional Needs:   Kcal:  1600-1800  Protein:  85-100 grams  Fluid:  > 1.6 L    Loistine Chance, RD, LDN, [place] Registered Dietitian II Certified Diabetes Care and Education Specialist Please refer to [facility] for RD and/or RD on-call/weekend/after hours pager

## 2022-08-02 DIAGNOSIS — S32599A Other specified fracture of unspecified pubis, initial encounter for closed fracture: Secondary | ICD-10-CM | POA: Diagnosis not present

## 2022-08-02 NOTE — Plan of Care (Signed)
  Problem: Increased Nutrient Needs (NI-5.1) Goal: Food and/or nutrient delivery Description: Individualized approach for food/nutrient provision. Outcome: Adequate for Discharge

## 2022-08-02 NOTE — NC FL2 (Signed)
[place] LEVEL OF CARE SCREENING TOOL     IDENTIFICATION  Patient Name: Tracy Faulkner Birthdate: 1932-01-27 Sex: female Admission Date (Current Location): 07/30/2022  [facility] and [place] Number:  Herbalist and Address:  [facility], [place] [address], [place], [place] [address]      Provider Number: [id number]  Attending Physician Name and Address:  Damita Lack, MD  Relative Name and Phone Number:  Judythe Postema, [phone]    Current Level of Care: Hospital Recommended Level of Care: [place] Prior Approval Number:    Date Approved/Denied:   PASRR Number: [id number]  Discharge Plan: SNF    Current Diagnoses: Patient Active Problem List   Diagnosis Date Noted   Closed fracture of multiple pubic rami, initial encounter ([place]) 07/30/2022   MCI (mild cognitive impairment) 07/30/2022   Chronic kidney disease, stage 3a ([place]) 07/30/2022   DNR (do not resuscitate) 07/30/2022   Hx of breast cancer 03/12/2022   Hav (hallux abducto valgus), unspecified laterality 11/02/2021   Dyspnea    Iron deficiency anemia 10/05/2017   Vitamin D deficiency 10/05/2017   Degenerative arthritis of left knee 10/07/2016   Near syncope 12/31/2015   Chest discomfort 12/31/2015   Premature ventricular contractions 12/31/2015   Breast cancer ([place])    Disturbance, sleep    Arthritis    Anemia    Frequent headaches    Back problem    Bowel trouble    Carpal tunnel syndrome    Ataxia    Hypertension    Pain in both knees     Orientation RESPIRATION BLADDER Height & Weight     Self  Normal Continent, External catheter Weight: 155 lb (70.3 kg) Height:  5' 4.5" (163.8 cm)  BEHAVIORAL SYMPTOMS/MOOD NEUROLOGICAL BOWEL NUTRITION STATUS      Continent Diet (See DC summary)  AMBULATORY STATUS COMMUNICATION OF NEEDS Skin   Total Care Verbally Normal                       Personal Care Assistance Level of Assistance   Bathing, Feeding, Dressing Bathing Assistance: Maximum assistance Feeding assistance: Limited assistance Dressing Assistance: Maximum assistance     Functional Limitations Info  Sight, Hearing, Speech Sight Info: Adequate Hearing Info: Adequate Speech Info: Adequate    SPECIAL CARE FACTORS FREQUENCY  PT (By licensed PT), OT (By licensed OT)     PT Frequency: 5x week OT Frequency: 5x week            Contractures Contractures Info: Not present    Additional Factors Info  Code Status, Allergies Code Status Info: DNR Allergies Info: Ace Inhibitors   Lortab (Hydrocodone-acetaminophen)   Remeron (Mirtazapine)   Penicillins           Current Medications (08/02/2022):  This is the current hospital active medication list Current Facility-Administered Medications  Medication Dose Route Frequency Provider Last Rate Last Admin   acetaminophen (TYLENOL) tablet 650 mg  650 mg Oral Q6H PRN Karmen Bongo, MD   650 mg at 08/01/22 1621   Or   acetaminophen (TYLENOL) suppository 650 mg  650 mg Rectal Q6H PRN Karmen Bongo, MD       bisacodyl (DULCOLAX) EC tablet 5 mg  5 mg Oral Daily PRN Karmen Bongo, MD       cholecalciferol (VITAMIN D3) 10 MCG (400 UNIT) tablet 400 Units  400 Units Oral Daily Damita Lack, MD  docusate sodium (COLACE) capsule 100 mg  100 mg Oral BID Amin, Ankit Chirag, MD   100 mg at 08/01/22 2023   enoxaparin (LOVENOX) injection 40 mg  40 mg Subcutaneous Q24H Karmen Bongo, MD   40 mg at 08/01/22 1847   feeding supplement (ENSURE ENLIVE / ENSURE PLUS) liquid 237 mL  237 mL Oral BID BM Amin, Ankit Chirag, MD   237 mL at 08/01/22 1136   guaiFENesin (ROBITUSSIN) 100 MG/5ML liquid 5 mL  5 mL Oral Q4H PRN Amin, Ankit Chirag, MD       hydrALAZINE (APRESOLINE) injection 10 mg  10 mg Intravenous Q4H PRN Amin, Ankit Chirag, MD       ipratropium-albuterol (DUONEB) 0.5-2.5 (3) MG/3ML nebulizer solution 3 mL  3 mL Nebulization Q4H PRN Amin, Ankit Chirag, MD        losartan (COZAAR) tablet 50 mg  50 mg Oral Daily Karmen Bongo, MD   50 mg at 08/01/22 0855   metoprolol tartrate (LOPRESSOR) injection 5 mg  5 mg Intravenous Q4H PRN Amin, Ankit Chirag, MD       morphine (PF) 2 MG/ML injection 2 mg  2 mg Intravenous Q2H PRN Karmen Bongo, MD       multivitamin with minerals tablet 1 tablet  1 tablet Oral Daily Amin, Ankit Chirag, MD   1 tablet at 08/01/22 1136   ondansetron (ZOFRAN) tablet 4 mg  4 mg Oral Q6H PRN Karmen Bongo, MD       Or   ondansetron [facility]) injection 4 mg  4 mg Intravenous Q6H PRN Karmen Bongo, MD       oxyCODONE (Oxy IR/ROXICODONE) immediate release tablet 5 mg  5 mg Oral Q4H PRN Karmen Bongo, MD   5 mg at 08/01/22 2023   pantoprazole (PROTONIX) EC tablet 40 mg  40 mg Oral Daily Karmen Bongo, MD   40 mg at 08/01/22 0855   polyethylene glycol (MIRALAX / GLYCOLAX) packet 17 g  17 g Oral Daily PRN Karmen Bongo, MD       traZODone (DESYREL) tablet 50 mg  50 mg Oral QHS PRN Damita Lack, MD         Discharge Medications: Please see discharge summary for a list of discharge medications.  Relevant Imaging Results:  Relevant Lab Results:   Additional Information SS# [id number] LCSWA

## 2022-08-02 NOTE — Progress Notes (Signed)
Physical Therapy Treatment Patient Details Name: Tracy Faulkner MRN: [id number] DOB: 01/18/1932 Today's Date: 08/02/2022   History of Present Illness Tracy Faulkner is a 88 y.o. female with medical history significant of breast cancer, CKD, and HTN presenting with a fall.  She has been falling at home, complaining of more joint pain in the last month or so. She fell yesterday and was put to bed in the evening. Her granddaughter found her in the floor about 0400. She is not currently having pain. Pt with R superior and inferior rami fractures. Pt also with chronic ventral hernia.    PT Comments    Pt A&O to self only and follows one step commands inconsistently. Session focused on bed mobility and transfer training. Pt requiring mod-max assist for functional mobility. Utilized Denna Haggard to progress out of bed to chair with heavy tactile assist at glute/hips to promote extension and anterior weight shift. Continue to recommend SNF for ongoing Physical Therapy.      Recommendations for follow up therapy are one component of a multi-disciplinary discharge planning process, led by the attending physician.  Recommendations may be updated based on patient status, additional functional criteria and insurance authorization.  Follow Up Recommendations  Skilled nursing-short term rehab (<3 hours/day) Can patient physically be transported by private vehicle: No   Assistance Recommended at Discharge Frequent or constant Supervision/Assistance  Patient can return home with the following A lot of help with bathing/dressing/bathroom;Assistance with cooking/housework;Assistance with feeding;Direct supervision/assist for medications management;Direct supervision/assist for financial management;Assist for transportation;Help with stairs or ramp for entrance;Two people to help with walking and/or transfers   Equipment Recommendations   (defer to SNF)    Recommendations for Other Services       Precautions /  Restrictions Precautions Precautions: Fall Restrictions Weight Bearing Restrictions: Yes RLE Weight Bearing: Weight bearing as tolerated LLE Weight Bearing: Weight bearing as tolerated     Mobility  Bed Mobility Overal bed mobility: Needs Assistance Bed Mobility: Supine to Sit     Supine to sit: Mod assist     General bed mobility comments: Heavy moderate assist to progress to left side of bed; step by step cues for sequencing and use of bed rail. Utilized bed pad to scoot hips out to edge of bed    Transfers Overall transfer level: Needs assistance Equipment used: Ambulation equipment used Transfers: Sit to/from Stand Sit to Stand: Max assist           General transfer comment: MaxA to perform partial stands x 3 from edge of bed. Max multimodal cues for sequencing, initiation, and execution. Tactile assist at hips/glutes to promote extension.    Ambulation/Gait                   Stairs             Wheelchair Mobility    Modified Rankin (Stroke Patients Only)       Balance Overall balance assessment: Needs assistance Sitting-balance support: Bilateral upper extremity supported, No upper extremity supported, Feet supported Sitting balance-Leahy Scale: Poor Sitting balance - Comments: Requiring up to minA; cues for anterior weight shift and midline positioning                                    Cognition Arousal/Alertness: Awake/alert Behavior During Therapy: Flat affect Overall Cognitive Status: Impaired/Different from baseline Area of Impairment: Orientation, Following commands, Safety/judgement, Awareness,  Problem solving                 Orientation Level: Disoriented to, Place, Time, Situation     Following Commands: Follows one step commands inconsistently Safety/Judgement: Decreased awareness of safety, Decreased awareness of deficits Awareness: Intellectual Problem Solving: Slow processing, Requires verbal cues,  Requires tactile cues, Difficulty sequencing, Decreased initiation General Comments: Pt A&O to self only; recalls she is in hospital, but not which one. Follows 25% of 1 step commands        Exercises      General Comments        Pertinent Vitals/Pain Pain Assessment Pain Assessment: Faces Faces Pain Scale: Hurts little more Pain Location: "right side" Pain Descriptors / Indicators: Discomfort Pain Intervention(s): Monitored during session    Home Living                          Prior Function            PT Goals (current goals can now be found in the care plan section) Acute Rehab PT Goals Patient Stated Goal: none stated PT Goal Formulation: With patient/family Time For Goal Achievement: 08/13/22 Potential to Achieve Goals: Fair    Frequency    Min 3X/week      PT Plan Current plan remains appropriate    Co-evaluation              AM-PAC PT "6 Clicks" Mobility   Outcome Measure  Help needed turning from your back to your side while in a flat bed without using bedrails?: A Lot Help needed moving from lying on your back to sitting on the side of a flat bed without using bedrails?: A Lot Help needed moving to and from a bed to a chair (including a wheelchair)?: Total Help needed standing up from a chair using your arms (e.g., wheelchair or bedside chair)?: Total Help needed to walk in hospital room?: Total Help needed climbing 3-5 steps with a railing? : Total 6 Click Score: 8    End of Session Equipment Utilized During Treatment: Gait belt Activity Tolerance: Patient limited by fatigue Patient left: with call bell/phone within reach;with family/visitor present;in chair;with chair alarm set Nurse Communication: Mobility status PT Visit Diagnosis: Other abnormalities of gait and mobility (R26.89);History of falling (Z91.81);Muscle weakness (generalized) (M62.81);Pain Pain - Right/Left: Right Pain - part of body: Hip     Time:  1113-1150 PT Time Calculation (min) (ACUTE ONLY): 37 min  Charges:  $Therapeutic Activity: 23-37 mins                    Wyona Almas, PT, DPT Acute Rehabilitation Services Office [phone]    Deno Etienne 08/02/2022, 12:48 PM

## 2022-08-02 NOTE — Progress Notes (Signed)
PROGRESS NOTE    Tracy Faulkner  DJM:[id number] DOB: Apr 23, 1932 DOA: 07/30/2022 PCP: Sandrea Hughs, NP   Brief Narrative:   88 y.o. female with medical history significant of breast cancer and HTN presenting with a fall.  She has been falling at home, complaining of more joint pain in the last month or so.  She fell yesterday and was put to bed in the evening.  Her granddaughter found her in the floor about 0400. CT shows R pubic rami fractures and ventral hernia (chronic and stable on CT).   Needs pain control, PT/OT, ortho outpatient f/u.  PT/OT has recommended SNF.  TOC consulted.   Assessment & Plan:  Principal Problem:   Closed fracture of multiple pubic rami, initial encounter ([place]) Active Problems:   Hypertension   MCI (mild cognitive impairment)   Chronic kidney disease, stage 3a (H[facility])   DNR (do not resuscitate)     Pelvic fractures Acute Superior inferior pubic rami fracture -ED provider spoke with orthopedic, recommending conservative management and outpatient follow-up.  PT/OT, pain control, bowel regimen.  PT recommended SNF, TOC consulted  Mild vitamin D deficiency - Vitamin D supplements   HTN -Continue losartan   Mild cognitive impairment Supportive care   Stage 3a CKD -Renal function stable  Nonobstructive ventral hernia - Supportive care  Anemia chronic disease - Baseline is evidence of bleeding   DNR -CODE STATUS discussed by admitted with the patient and family, confirmed DNR      DVT prophylaxis: Lovenox Code Status: DNR Family Communication: Met with the daughter at bedside  Patient doing better, pending placement  Subjective: No complaints doing well   Examination: Constitutional: Not in acute distress, appears chronically ill Respiratory: Clear to auscultation bilaterally Cardiovascular: Normal sinus rhythm, no rubs Abdomen: Nontender nondistended good bowel sounds Musculoskeletal: No edema noted Skin: No rashes  seen Neurologic: CN 2-12 grossly intact.  And nonfocal Psychiatric: Normal judgment and insight. Alert and oriented x 3. Normal mood.    Objective: Vitals:   07/31/22 1417 07/31/22 1916 08/01/22 2246 08/02/22 0539  BP: (!) 177/98 128/74 (!) 155/86 130/75  Pulse: 71 84 68 68  Resp: 17 18 20 20   Temp:  98.9 F (37.2 C) 98.4 F (36.9 C) 98 F (36.7 C)  TempSrc: Oral  Oral Oral  SpO2:  100% 100% 100%  Weight: 70.3 kg     Height: 5' 4.5" (1.638 m)       Intake/Output Summary (Last 24 hours) at 08/02/2022 0746 Last data filed at 08/02/2022 0549 Gross per 24 hour  Intake --  Output 600 ml  Net -600 ml   Filed Weights   07/31/22 1417  Weight: 70.3 kg     Data Reviewed:   CBC: Recent Labs  Lab 07/28/22 1507 07/30/22 0626 07/31/22 0108 07/31/22 0746  WBC 5.3 5.8 6.4 6.9  NEUTROABS 3,525 3.9  --   --   HGB 10.8* 10.6* 10.0* 10.5*  HCT 34.3* 34.1* 30.5* 32.8*  MCV 81.1 82.6 79.4* 80.4  PLT 174 177 144* 222   Basic Metabolic Panel: Recent Labs  Lab 07/28/22 1507 07/30/22 0626 07/31/22 0108 07/31/22 0746  NA 141 143 141 140  K 4.0 4.0 4.1 4.1  CL 109 110 113* 111  CO2 23 23 23  20*  GLUCOSE 87 100* 88 78  BUN 15 14 8 10   CREATININE 1.02* 1.01* 0.92 0.95  CALCIUM 9.8 9.8 8.9 9.0  MG  --   --   --  1.9  GFR: Estimated Creatinine Clearance: 39.9 mL/min (by C-G formula based on SCr of 0.95 mg/dL). Liver Function Tests: Recent Labs  Lab 07/30/22 0626  AST 28  ALT 18  ALKPHOS 60  BILITOT 0.9  PROT 7.0  ALBUMIN 3.5   No results for input(s): "LIPASE", "AMYLASE" in the last 168 hours. No results for input(s): "AMMONIA" in the last 168 hours. Coagulation Profile: No results for input(s): "INR", "PROTIME" in the last 168 hours. Cardiac Enzymes: Recent Labs  Lab 07/30/22 0626  CKTOTAL 200   BNP (last 3 results) No results for input(s): "PROBNP" in the last 8760 hours. HbA1C: No results for input(s): "HGBA1C" in the last 72 hours. CBG: No results for  input(s): "GLUCAP" in the last 168 hours. Lipid Profile: No results for input(s): "CHOL", "HDL", "LDLCALC", "TRIG", "CHOLHDL", "LDLDIRECT" in the last 72 hours. Thyroid Function Tests: No results for input(s): "TSH", "T4TOTAL", "FREET4", "T3FREE", "THYROIDAB" in the last 72 hours. Anemia Panel: No results for input(s): "VITAMINB12", "FOLATE", "FERRITIN", "TIBC", "IRON", "RETICCTPCT" in the last 72 hours. Sepsis Labs: No results for input(s): "PROCALCITON", "LATICACIDVEN" in the last 168 hours.  Recent Results (from the past 240 hour(s))  Urine Culture     Status: None   Collection Time: 07/29/22  3:47 PM   Specimen: Urine  Result Value Ref Range Status   MICRO NUMBER: [id number]  Final   SPECIMEN QUALITY: Adequate  Final   Sample Source URINE  Final   STATUS: FINAL  Final   ISOLATE 1:   Final    Greater than 100,000 CFU/mL of Non-uropathogenic Gram positive organism May represent colonizers from external and internal genitalia. No further testing (including susceptibility) will be performed.         Radiology Studies: No results found.      Scheduled Meds:  cholecalciferol  400 Units Oral Daily   docusate sodium  100 mg Oral BID   enoxaparin (LOVENOX) injection  40 mg Subcutaneous Q24H   feeding supplement  237 mL Oral BID BM   losartan  50 mg Oral Daily   multivitamin with minerals  1 tablet Oral Daily   pantoprazole  40 mg Oral Daily   Continuous Infusions:   LOS: 0 days   Time spent= 35 mins    Kyheem Bathgate Arsenio Loader, MD [facility]  If 7PM-7AM, please contact night-coverage  08/02/2022, 7:46 AM

## 2022-08-02 NOTE — Progress Notes (Signed)
MD paged that pain management is not effective for his right foot. Arthor Captain LPN

## 2022-08-02 NOTE — TOC Initial Note (Signed)
Transition of Care [facility]) - Initial/Assessment Note    Patient Details  Name: Tracy Faulkner MRN: [id number] Date of Birth: 1931-12-06  [facility]) CM/SW Contact:    Tracy Faulkner, [place] Phone Number: 08/02/2022, 10:58 AM  Clinical Narrative:                 CSW met with pt and daughter at bedside to discuss discharge plans. Pt and daughter agreeable to SNF and state [facility] as a preference, they have a friend who works there. They are agreeable to a further fax out for additional options. Pt resides with dtr and plans to return after rehab. CSW will follow up with [facility] to discuss bed availability. TOC will continue to follow for DC needs.  Expected Discharge Plan: Skilled Nursing Facility Barriers to Discharge: Insurance Authorization, SNF Pending bed offer, Continued Medical Work up   Patient Goals and CMS Choice Patient states their goals for this hospitalization and ongoing recovery are:: Pt agreeable to SNF CMS Medicare.gov Compare Post Acute Care list provided to:: Patient Represenative (must comment) (Daughter) Choice offered to / list presented to : Patient, Adult Children  Expected Discharge Plan and Services Expected Discharge Plan: [place] Acute Care Choice: [place] Living arrangements for the past 2 months: Single Family Home                                      Prior Living Arrangements/Services Living arrangements for the past 2 months: Single Family Home Lives with:: Adult Children Patient language and need for interpreter reviewed:: Yes Do you feel safe going back to the place where you live?: Yes      Need for Family Participation in Patient Care: Yes (Comment) Care giver support system in place?: Yes (comment)   Criminal Activity/Legal Involvement Pertinent to Current Situation/Hospitalization: No - Comment as needed  Activities of Daily Living Home Assistive Devices/Equipment:  Eyeglasses, Dentures (specify type) (dentures upper) ADL Screening (condition at time of admission) Patient's cognitive ability adequate to safely complete daily activities?: Yes Is the patient deaf or have difficulty hearing?: No Does the patient have difficulty seeing, even when wearing glasses/contacts?: No Does the patient have difficulty concentrating, remembering, or making decisions?: No Patient able to express need for assistance with ADLs?: Yes Does the patient have difficulty dressing or bathing?: Yes Independently performs ADLs?: No Communication: Independent Dressing (OT): Independent Grooming: Needs assistance Is this a change from baseline?: Pre-admission baseline Feeding: Independent Bathing: Needs assistance Toileting: Independent Does the patient have difficulty walking or climbing stairs?: Yes Weakness of Legs: Both Weakness of Arms/Hands: None  Permission Sought/Granted Permission sought to share information with : Family Supports Permission granted to share information with : Yes, Verbal Permission Granted  Share Information with NAME: Tracy Faulkner     Permission granted to share info w Relationship: Daughter  Permission granted to share info w Contact Information: [phone]  Emotional Assessment Appearance:: Appears stated age Attitude/Demeanor/Rapport: Engaged Affect (typically observed): Appropriate Orientation: : Oriented to Self Alcohol / Substance Use: Not Applicable Psych Involvement: No (comment)  Admission diagnosis:  Fall, initial encounter [W19.XXXA] Closed fracture of multiple pubic rami, right, initial encounter ([place]) [S32.591A] Closed fracture of multiple pubic rami, initial encounter [facility]) [S32.599A] Patient Active Problem List   Diagnosis Date Noted   Closed fracture of multiple pubic rami, initial encounter ([place]) 07/30/2022  MCI (mild cognitive impairment) 07/30/2022   Chronic kidney disease, stage 3a ([place]) 07/30/2022   DNR (do not  resuscitate) 07/30/2022   Hx of breast cancer 03/12/2022   Hav (hallux abducto valgus), unspecified laterality 11/02/2021   Dyspnea    Iron deficiency anemia 10/05/2017   Vitamin D deficiency 10/05/2017   Degenerative arthritis of left knee 10/07/2016   Near syncope 12/31/2015   Chest discomfort 12/31/2015   Premature ventricular contractions 12/31/2015   Breast cancer ([place])    Disturbance, sleep    Arthritis    Anemia    Frequent headaches    Back problem    Bowel trouble    Carpal tunnel syndrome    Ataxia    Hypertension    Pain in both knees    PCP:  Ngetich, Nelda Bucks, NP Pharmacy:   [facility] ([place]), [place] - [address] [place] ([place]) [place] [address] Phone: [phone] Fax: [phone]     Social Determinants of Health (SDOH) Interventions    Readmission Risk Interventions     No data to display

## 2022-08-03 DIAGNOSIS — R519 Headache, unspecified: Secondary | ICD-10-CM | POA: Diagnosis not present

## 2022-08-03 DIAGNOSIS — N1831 Chronic kidney disease, stage 3a: Secondary | ICD-10-CM | POA: Diagnosis not present

## 2022-08-03 DIAGNOSIS — S0990XA Unspecified injury of head, initial encounter: Secondary | ICD-10-CM | POA: Diagnosis not present

## 2022-08-03 DIAGNOSIS — S32591A Other specified fracture of right pubis, initial encounter for closed fracture: Secondary | ICD-10-CM | POA: Diagnosis not present

## 2022-08-03 DIAGNOSIS — C50919 Malignant neoplasm of unspecified site of unspecified female breast: Secondary | ICD-10-CM | POA: Diagnosis not present

## 2022-08-03 DIAGNOSIS — A419 Sepsis, unspecified organism: Secondary | ICD-10-CM | POA: Diagnosis not present

## 2022-08-03 DIAGNOSIS — Z96652 Presence of left artificial knee joint: Secondary | ICD-10-CM | POA: Diagnosis not present

## 2022-08-03 DIAGNOSIS — K219 Gastro-esophageal reflux disease without esophagitis: Secondary | ICD-10-CM | POA: Diagnosis not present

## 2022-08-03 DIAGNOSIS — I10 Essential (primary) hypertension: Secondary | ICD-10-CM | POA: Diagnosis not present

## 2022-08-03 DIAGNOSIS — S32591D Other specified fracture of right pubis, subsequent encounter for fracture with routine healing: Secondary | ICD-10-CM | POA: Diagnosis not present

## 2022-08-03 DIAGNOSIS — S32501D Unspecified fracture of right pubis, subsequent encounter for fracture with routine healing: Secondary | ICD-10-CM | POA: Diagnosis not present

## 2022-08-03 DIAGNOSIS — R4189 Other symptoms and signs involving cognitive functions and awareness: Secondary | ICD-10-CM | POA: Diagnosis not present

## 2022-08-03 DIAGNOSIS — S32599A Other specified fracture of unspecified pubis, initial encounter for closed fracture: Secondary | ICD-10-CM | POA: Diagnosis not present

## 2022-08-03 DIAGNOSIS — G8929 Other chronic pain: Secondary | ICD-10-CM | POA: Diagnosis not present

## 2022-08-03 DIAGNOSIS — I129 Hypertensive chronic kidney disease with stage 1 through stage 4 chronic kidney disease, or unspecified chronic kidney disease: Secondary | ICD-10-CM | POA: Diagnosis not present

## 2022-08-03 DIAGNOSIS — M6281 Muscle weakness (generalized): Secondary | ICD-10-CM | POA: Diagnosis not present

## 2022-08-03 DIAGNOSIS — Z4789 Encounter for other orthopedic aftercare: Secondary | ICD-10-CM | POA: Diagnosis not present

## 2022-08-03 DIAGNOSIS — Z79899 Other long term (current) drug therapy: Secondary | ICD-10-CM | POA: Diagnosis not present

## 2022-08-03 DIAGNOSIS — K5900 Constipation, unspecified: Secondary | ICD-10-CM | POA: Diagnosis not present

## 2022-08-03 DIAGNOSIS — S32599D Other specified fracture of unspecified pubis, subsequent encounter for fracture with routine healing: Secondary | ICD-10-CM | POA: Diagnosis not present

## 2022-08-03 DIAGNOSIS — Z7401 Bed confinement status: Secondary | ICD-10-CM | POA: Diagnosis not present

## 2022-08-03 DIAGNOSIS — Z853 Personal history of malignant neoplasm of breast: Secondary | ICD-10-CM | POA: Diagnosis not present

## 2022-08-03 LAB — CBC
HCT: 33.5 % — ABNORMAL LOW (ref 36.0–46.0)
Hemoglobin: 10.8 g/dL — ABNORMAL LOW (ref 12.0–15.0)
MCH: 25.8 pg — ABNORMAL LOW (ref 26.0–34.0)
MCHC: 32.2 g/dL (ref 30.0–36.0)
MCV: 80.1 fL (ref 80.0–100.0)
Platelets: 197 10*3/uL (ref 150–400)
RBC: 4.18 MIL/uL (ref 3.87–5.11)
RDW: 15.5 % (ref 11.5–15.5)
WBC: 5.8 10*3/uL (ref 4.0–10.5)
nRBC: 0 % (ref 0.0–0.2)

## 2022-08-03 LAB — BASIC METABOLIC PANEL
Anion gap: 11 (ref 5–15)
BUN: 17 mg/dL (ref 8–23)
CO2: 26 mmol/L (ref 22–32)
Calcium: 10 mg/dL (ref 8.9–10.3)
Chloride: 106 mmol/L (ref 98–111)
Creatinine, Ser: 1.01 mg/dL — ABNORMAL HIGH (ref 0.44–1.00)
GFR, Estimated: 54 mL/min — ABNORMAL LOW (ref >60)
Glucose, Bld: 106 mg/dL — ABNORMAL HIGH (ref 70–99)
Potassium: 4.1 mmol/L (ref 3.5–5.1)
Sodium: 143 mmol/L (ref 135–145)

## 2022-08-03 LAB — GLUCOSE, CAPILLARY
Glucose-Capillary: 74 mg/dL (ref 70–99)
Glucose-Capillary: 89 mg/dL (ref 70–99)

## 2022-08-03 LAB — MAGNESIUM: Magnesium: 2.2 mg/dL (ref 1.7–2.4)

## 2022-08-03 MED ORDER — BISACODYL 5 MG PO TBEC: 5.0000 mg | DELAYED_RELEASE_TABLET | Freq: Every day | ORAL | 0 refills | Status: DC | PRN

## 2022-08-03 MED ORDER — OXYCODONE HCL 5 MG PO TABS: 5.0000 mg | ORAL_TABLET | Freq: Four times a day (QID) | ORAL | 0 refills | Status: DC | PRN

## 2022-08-03 MED ORDER — CHOLECALCIFEROL 10 MCG (400 UNIT) PO TABS
400.0000 [IU] | ORAL_TABLET | Freq: Every day | ORAL | 0 refills | Status: DC
Start: 2022-08-03 — End: 2022-12-15

## 2022-08-03 MED ORDER — DOCUSATE SODIUM 100 MG PO CAPS: 100.0000 mg | ORAL_CAPSULE | Freq: Every day | ORAL | 0 refills | Status: DC

## 2022-08-03 NOTE — Plan of Care (Signed)
?  Problem: Health Behavior/Discharge Planning: ?Goal: Ability to manage health-related needs will improve ?Outcome: Progressing ?  ?Problem: Nutrition: ?Goal: Adequate nutrition will be maintained ?Outcome: Progressing ?  ?Problem: Safety: ?Goal: Ability to remain free from injury will improve ?Outcome: Progressing ?  ?Problem: Skin Integrity: ?Goal: Risk for impaired skin integrity will decrease ?Outcome: Progressing ?  ?

## 2022-08-03 NOTE — Discharge Summary (Addendum)
Physician Discharge Summary  Tracy Faulkner PZW:[id number] DOB: 02-Nov-1932 DOA: 07/30/2022  PCP: Sandrea Hughs, NP  Admit date: 07/30/2022 Discharge date: 08/03/2022  Admitted From: Home Disposition:  Home  Recommendations for Outpatient Follow-up:  Follow up with PCP in 1-2 weeks Please obtain BMP/CBC in one week your next doctors visit.  Vitamin D supplements  Home Health: Equipment/Devices: Discharge Condition: Stable CODE STATUS:  Diet recommendation:   Brief/Interim Summary:  88 y.o. female with medical history significant of breast cancer and HTN presenting with a fall.  She has been falling at home, complaining of more joint pain in the last month or so.  She fell yesterday and was put to bed in the evening.  Her granddaughter found her in the floor about 0400. CT shows R pubic rami fractures and ventral hernia (chronic and stable on CT).   Needs pain control, PT/OT, ortho outpatient f/u.  PT/OT has recommended SNF.  TOC consulted.     Assessment & Plan:  Principal Problem:   Closed fracture of multiple pubic rami, initial encounter ([place]) Active Problems:   Hypertension   MCI (mild cognitive impairment)   Chronic kidney disease, stage 3a (HCC)   DNR (do not resuscitate)     Pelvic fractures Acute Superior inferior pubic rami fracture -ED provider spoke with orthopedic, recommending conservative management and outpatient follow-up.  PT/OT, pain control, bowel regimen.  PT recommended SNF, TOC consulted   Mild vitamin D deficiency - Vitamin D supplements   HTN -Continue losartan   Mild cognitive impairment Supportive care   Stage 3a CKD -Renal function stable   Nonobstructive ventral hernia - Supportive care   Anemia chronic disease - Baseline is evidence of bleeding   DNR -CODE STATUS discussed by admitted with the patient and family, confirmed DNR      Discharge Diagnoses:  Principal Problem:   Closed fracture of multiple pubic rami, initial  encounter ([place]) Active Problems:   Hypertension   MCI (mild cognitive impairment)   Chronic kidney disease, stage 3a ([place])   DNR (do not resuscitate)      Consultations: None  Subjective: NO complaints, doing well.   Discharge Exam: Vitals:   08/03/22 0745 08/03/22 1406  BP: (!) 169/105 (!) 172/94  Pulse: 93 69  Resp:    Temp: 98 F (36.7 C) 97.8 F (36.6 C)  SpO2: 94% 100%   Vitals:   08/02/22 2122 08/03/22 0506 08/03/22 0745 08/03/22 1406  BP: 116/73 (!) 151/94 (!) 169/105 (!) 172/94  Pulse: 68 81 93 69  Resp: 18 18    Temp: 98.3 F (36.8 C) 98.3 F (36.8 C) 98 F (36.7 C) 97.8 F (36.6 C)  TempSrc: Oral Oral Oral Oral  SpO2: 98% 100% 94% 100%  Weight:      Height:        General: Pt is alert, awake, not in acute distress. Elderly frail.  Cardiovascular: RRR, S1/S2 +, no rubs, no gallops Respiratory: CTA bilaterally, no wheezing, no rhonchi Abdominal: Soft, NT, ND, bowel sounds + Extremities: no edema, no cyanosis  Discharge Instructions   Allergies as of 08/03/2022       Reactions   Ace Inhibitors Cough   Lortab [hydrocodone-acetaminophen] Other (See Comments)   Bad dreams   Remeron [mirtazapine] Other (See Comments)   Hallucinations (can take 1/2 tablet--7.5 mg), not 15 mg   Penicillins Rash, Other (See Comments)   Throat swelling, Has patient had a PCN reaction causing immediate rash, facial/tongue/throat swelling, SOB or lightheadedness  with hypotension: Yes Has patient had a PCN reaction causing severe rash involving mucus membranes or skin necrosis: No Has patient had a PCN reaction that required hospitalization No Has patient had a PCN reaction occurring within the last 10 years: No If all of the above answers are "NO", then may proceed with Cephalosporin use.        Medication List     STOP taking these medications    cycloSPORINE 0.05 % ophthalmic emulsion Commonly known as: RESTASIS       TAKE these medications     acetaminophen 650 MG CR tablet Commonly known as: TYLENOL Take 650 mg by mouth daily as needed for pain.   bisacodyl 5 MG EC tablet Commonly known as: DULCOLAX Take 1 tablet (5 mg total) by mouth daily as needed for moderate constipation.   CALCIUM PO Take 1 capsule by mouth daily.   cholecalciferol 10 MCG (400 UNIT) Tabs tablet Commonly known as: VITAMIN D3 Take 1 tablet (400 Units total) by mouth daily.   docusate sodium 100 MG capsule Commonly known as: COLACE Take 1 capsule (100 mg total) by mouth daily.   FISH OIL PO Take 1 capsule by mouth daily.   losartan 50 MG tablet Commonly known as: COZAAR Take 1 tablet by mouth once daily   MiraLax 17 g packet Generic drug: polyethylene glycol Take 17 g by mouth daily as needed for mild constipation.   multivitamin tablet Take 1 tablet by mouth daily.   omeprazole 40 MG capsule Commonly known as: PRILOSEC TAKE 1 CAPSULE BY MOUTH ONCE DAILY BEFORE BREAKFAST What changed:  how much to take how to take this when to take this additional instructions   OVER THE COUNTER MEDICATION Take 1 capsule by mouth daily. Gluco Gel Joint & Cartilage Support   OVER THE COUNTER MEDICATION Apply 1 application  topically daily as needed (pain relief). Youngevity Pain Cream   oxyCODONE 5 MG immediate release tablet Commonly known as: Oxy IR/ROXICODONE Take 1 tablet (5 mg total) by mouth every 6 (six) hours as needed for severe pain or breakthrough pain.        Allergies  Allergen Reactions   Ace Inhibitors Cough   Lortab [Hydrocodone-Acetaminophen] Other (See Comments)    Bad dreams   Remeron [Mirtazapine] Other (See Comments)    Hallucinations (can take 1/2 tablet--7.5 mg), not 15 mg   Penicillins Rash and Other (See Comments)    Throat swelling, Has patient had a PCN reaction causing immediate rash, facial/tongue/throat swelling, SOB or lightheadedness with hypotension: Yes Has patient had a PCN reaction causing severe  rash involving mucus membranes or skin necrosis: No Has patient had a PCN reaction that required hospitalization No Has patient had a PCN reaction occurring within the last 10 years: No If all of the above answers are "NO", then may proceed with Cephalosporin use.     You were cared for by a hospitalist during your hospital stay. If you have any questions about your discharge medications or the care you received while you were in the hospital after you are discharged, you can call the unit and asked to speak with the hospitalist on call if the hospitalist that took care of you is not available. Once you are discharged, your primary care physician will handle any further medical issues. Please note that no refills for any discharge medications will be authorized once you are discharged, as it is imperative that you return to your primary care physician (or establish a  relationship with a primary care physician if you do not have one) for your aftercare needs so that they can reassess your need for medications and monitor your lab values.   Procedures/Studies: CT ABDOMEN PELVIS W CONTRAST  Result Date: 07/30/2022 CLINICAL DATA:  Complicated ventral hernia, fall at home, found on floor by family, struck head on wall, RIGHT leg pain EXAM: CT ABDOMEN AND PELVIS WITH CONTRAST TECHNIQUE: Multidetector CT imaging of the abdomen and pelvis was performed using the standard protocol following bolus administration of intravenous contrast. RADIATION DOSE REDUCTION: This exam was performed according to the departmental dose-optimization program which includes automated exposure control, adjustment of the mA and/or kV according to patient size and/or use of iterative reconstruction technique. CONTRAST:  42m OMNIPAQUE IOHEXOL 300 MG/ML SOLN IV. No oral contrast. COMPARISON:  None Available. FINDINGS: Lower chest: Minimal bibasilar atelectasis. Minimal enlargement of cardiac chambers. Hepatobiliary: Post  cholecystectomy. Small hepatic cysts. Liver otherwise unremarkable. Pancreas: Normal appearance Spleen: Normal appearance Adrenals/Urinary Tract: Adrenal glands and RIGHT kidney normal appearance. Upper pole LEFT renal cyst 2.5 cm diameter; no follow-up imaging recommended. Kidneys, ureters, and bladder otherwise normal appearance Stomach/Bowel: Transverse colon and a small bowel loop extend into a supraumbilical ventral hernia without obstruction. Multiple small bowel loops extend into a umbilical hernia without obstruction. Sigmoid diverticulosis. Appendix not definitely visualized. Stomach and remaining bowel loops normal appearance. Perigastric surgical clips noted. Vascular/Lymphatic: Atherosclerotic calcifications aorta and iliac arteries without aneurysm. Vascular structures patent. Few normal sized mesenteric and retroperitoneal nodes without adenopathy. Reproductive: Calcified leiomyoma within uterus 3.0 cm diameter. Uterus and adnexa otherwise unremarkable Other: No free air or free fluid. Musculoskeletal: Diffuse osseous demineralization. Multilevel degenerative disc and facet disease changes lumbar spine with degenerative grade 1 anterolisthesis L3-L4 IMPRESSION: Supraumbilical ventral hernia containing nonobstructed transverse colon and a small bowel loop. Multiple small bowel loops extend into an umbilical hernia without obstruction. Sigmoid diverticulosis without evidence of diverticulitis. Calcified uterine leiomyoma 3.0 cm diameter. No acute intra-abdominal or intrapelvic abnormalities. Aortic Atherosclerosis (ICD10-I70.0). Electronically Signed   By: MLavonia DanaM.D.   On: 07/30/2022 12:59   CT Hip Right Wo Contrast  Result Date: 07/30/2022 CLINICAL DATA:  Fall at home. Found down. Hip pain, fracture suspected. EXAM: CT OF THE RIGHT HIP WITHOUT CONTRAST TECHNIQUE: Multidetector CT imaging of the right hip was performed according to the standard protocol. Multiplanar CT image reconstructions were  also generated. RADIATION DOSE REDUCTION: This exam was performed according to the departmental dose-optimization program which includes automated exposure control, adjustment of the mA and/or kV according to patient size and/or use of iterative reconstruction technique. COMPARISON:  Radiographs 07/30/2022 and 07/28/2022. FINDINGS: Bones/Joint/Cartilage The bones are demineralized. There is an acute nondisplaced fracture of the right superior pubic ramus. There is a mildly displaced acute fracture of the right inferior pubic ramus. No definite acute fracture of the proximal femur is identified. There is no dislocation or evidence of femoral head osteonecrosis. A small right hip joint effusion is present. There are mild degenerative changes at the right hip, sacroiliac joint and symphysis pubis. Ligaments Suboptimally assessed by CT. Muscles and Tendons No evidence of intramuscular hematoma or significant atrophy. Soft tissues Generalized subcutaneous edema surrounding the right hip without focal fluid collection, foreign body or soft tissue emphysema. Calcified uterine fibroids are noted. A large ventral abdominal wall hernia containing multiple loops of bowel is only partially imaged. There are diverticular changes within the sigmoid colon and diffuse aortoiliac atherosclerosis. IMPRESSION: 1. Acute fractures of the right  superior and inferior pubic rami. 2. No evidence of acute proximal femur fracture or dislocation. Small nonspecific hip joint effusion. 3. Mild degenerative changes as described. 4. Large ventral abdominal wall hernia containing multiple loops of bowel, incompletely visualized. Electronically Signed   By: Richardean Sale M.D.   On: 07/30/2022 11:03   CT Head Wo Contrast  Result Date: 07/30/2022 CLINICAL DATA:  Status post fall.  Headache. EXAM: CT HEAD WITHOUT CONTRAST TECHNIQUE: Contiguous axial images were obtained from the base of the skull through the vertex without intravenous contrast.  RADIATION DOSE REDUCTION: This exam was performed according to the departmental dose-optimization program which includes automated exposure control, adjustment of the mA and/or kV according to patient size and/or use of iterative reconstruction technique. COMPARISON:  None Available. FINDINGS: Brain: Ventricles and sulci are prominent compatible with atrophy. No evidence for acute cortical based infarct, intracranial hemorrhage, mass lesion or mass effect. Periventricular and subcortical white matter hypodensities compatible with chronic microvascular ischemic changes. Vascular: Unremarkable Skull: Intact. Sinuses/Orbits: Paranasal sinuses are well aerated. Mastoid air cells are unremarkable. Other: None. IMPRESSION: No acute intracranial process. Electronically Signed   By: Lovey Newcomer M.D.   On: 07/30/2022 07:50   DG Knee Complete 4 Views Right  Result Date: 07/30/2022 CLINICAL DATA:  88 year old female status post fall with pain. EXAM: RIGHT KNEE - COMPLETE 4+ VIEW COMPARISON:  Right knee series 07/28/2022 and earlier. FINDINGS: Severe tricompartmental joint space loss and bulky degenerative spurring with coarsely calcified 3.5 cm suprapatellar loose body redemonstrated. No superimposed acute fracture or dislocation identified. Stable since 07/21/2022. IMPRESSION: 1. No acute fracture or dislocation identified about the right knee. 2. Very severe tricompartmental degenerative changes with stable chronic suprapatellar loose body. Electronically Signed   By: Genevie Ann M.D.   On: 07/30/2022 07:43   DG Hip Unilat W or Wo Pelvis 2-3 Views Right  Result Date: 07/30/2022 CLINICAL DATA:  88 year old female status post fall with right hip pain. EXAM: DG HIP (WITH OR WITHOUT PELVIS) 2-3V RIGHT COMPARISON:  Right femur series and pelvis 07/28/2022. FINDINGS: Femoral heads remain normally located. Pelvis appears stable and intact. Grossly stable proximal left femur. Dystrophic uterine fibroid calcification in the pelvis  is stable. Negative visible bowel gas. Subtle irregularity of the right femoral neck compared to the left side. Intertrochanteric segment appears to remain intact. Proximal right femoral shaft appears intact. IMPRESSION: 1. Subtle irregularity of the right femoral neck stable since 07/28/2022. Difficult to exclude nondisplaced fracture. If the patient is unable to weightbear MRI is the preferred modality for further evaluation. 2.  No acute fracture or dislocation identified in the pelvis. Electronically Signed   By: Genevie Ann M.D.   On: 07/30/2022 07:41   DG Pelvis 1-2 Views  Result Date: 07/29/2022 CLINICAL DATA:  Pelvic/groin pain after a fall. EXAM: PELVIS - 1-2 VIEW COMPARISON:  None Available. FINDINGS: There is no evidence of pelvic fracture or diastasis. No pelvic bone lesions are seen. Degenerative changes are seen in the spine. IMPRESSION: No acute osseous injury. Electronically Signed   By: Zerita Boers M.D.   On: 07/29/2022 14:54   DG FEMUR, MIN 2 VIEWS RIGHT  Result Date: 07/29/2022 CLINICAL DATA:  Right thigh pain after a fall. EXAM: RIGHT FEMUR 2 VIEWS COMPARISON:  None Available. FINDINGS: There is no evidence of fracture or other focal bone lesions involving the femur. Soft tissues are unremarkable. IMPRESSION: No acute osseous injury of the femur. Electronically Signed   By: Harley Hallmark.D.  On: 07/29/2022 14:53   DG Knee 1-2 Views Right  Result Date: 07/29/2022 CLINICAL DATA:  Knee pain after a fall. EXAM: RIGHT KNEE - 1-2 VIEW COMPARISON:  Knee radiographs dated 07/21/2022. FINDINGS: There is depression of the lateral tibial plateau which appears chronic, however the bones are diffusely demineralized and an acute impaction fracture is difficult to exclude. No acute displaced fracture is identified. 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 A calcification in the suprapatellar region likely represents a calcified loose body measuring 3.9 cm in size. There is no  knee joint effusion IMPRESSION: Chronic appearing depression of the lateral tibial plateau, however the bones are diffusely demineralized and an acute impaction fracture is difficult to exclude. Electronically Signed   By: Zerita Boers M.D.   On: 07/29/2022 14:53   DG Knee Complete 4 Views Right  Result Date: 07/22/2022 CLINICAL DATA:  Knee pain EXAM: RIGHT KNEE - COMPLETE 4+ VIEW COMPARISON:  None Available. FINDINGS: No fracture or malalignment. Moderate severe tricompartment arthritis of the knee. Positive for knee effusion. 3.5 cm calcification in the suprapatellar region likely due to a large calcified loose body. IMPRESSION: 1. 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 Probable large suprapatellar calcified loose body Electronically Signed   By: Donavan Foil M.D.   On: 07/22/2022 21:16     The results of significant diagnostics from this hospitalization (including imaging, microbiology, ancillary and laboratory) are listed below for reference.     Microbiology: Recent Results (from the past 240 hour(s))  Urine Culture     Status: None   Collection Time: 07/29/22  3:47 PM   Specimen: Urine  Result Value Ref Range Status   MICRO NUMBER: [id number]  Final   SPECIMEN QUALITY: Adequate  Final   Sample Source URINE  Final   STATUS: FINAL  Final   ISOLATE 1:   Final    Greater than 100,000 CFU/mL of Non-uropathogenic Gram positive organism May represent colonizers from external and internal genitalia. No further testing (including susceptibility) will be performed.     Labs: BNP (last 3 results) No results for input(s): "BNP" in the last 8760 hours. Basic Metabolic Panel: Recent Labs  Lab 07/28/22 1507 07/30/22 0626 07/31/22 0108 07/31/22 0746 08/03/22 0716  NA 141 143 141 140 143  K 4.0 4.0 4.1 4.1 4.1  CL 109 110 113* 111 106  CO2 '23 23 23 '$ 20* 26  GLUCOSE 87 100* 88 78 106*  BUN '15 14 8 10 17  '$ CREATININE 1.02* 1.01* 0.92 0.95 1.01*  CALCIUM 9.8  9.8 8.9 9.0 10.0  MG  --   --   --  1.9 2.2   Liver Function Tests: Recent Labs  Lab 07/30/22 0626  AST 28  ALT 18  ALKPHOS 60  BILITOT 0.9  PROT 7.0  ALBUMIN 3.5   No results for input(s): "LIPASE", "AMYLASE" in the last 168 hours. No results for input(s): "AMMONIA" in the last 168 hours. CBC: Recent Labs  Lab 07/28/22 1507 07/30/22 0626 07/31/22 0108 07/31/22 0746 08/03/22 0716  WBC 5.3 5.8 6.4 6.9 5.8  NEUTROABS 3,525 3.9  --   --   --   HGB 10.8* 10.6* 10.0* 10.5* 10.8*  HCT 34.3* 34.1* 30.5* 32.8* 33.5*  MCV 81.1 82.6 79.4* 80.4 80.1  PLT 174 177 144* 166 197   Cardiac Enzymes: Recent Labs  Lab 07/30/22 0626  CKTOTAL 200   BNP: Invalid input(s): "POCBNP" CBG: Recent Labs  Lab 08/03/22 0004  GLUCAP  89   D-Dimer No results for input(s): "DDIMER" in the last 72 hours. Hgb A1c No results for input(s): "HGBA1C" in the last 72 hours. Lipid Profile No results for input(s): "CHOL", "HDL", "LDLCALC", "TRIG", "CHOLHDL", "LDLDIRECT" in the last 72 hours. Thyroid function studies No results for input(s): "TSH", "T4TOTAL", "T3FREE", "THYROIDAB" in the last 72 hours.  Invalid input(s): "FREET3" Anemia work up No results for input(s): "VITAMINB12", "FOLATE", "FERRITIN", "TIBC", "IRON", "RETICCTPCT" in the last 72 hours. Urinalysis    Component Value Date/Time   COLORURINE YELLOW 07/30/2022 0719   APPEARANCEUR CLEAR 07/30/2022 0719   APPEARANCEUR Cloudy (A) 01/29/2021 1700   LABSPEC 1.011 07/30/2022 0719   PHURINE 5.0 07/30/2022 0719   GLUCOSEU NEGATIVE 07/30/2022 0719   HGBUR NEGATIVE 07/30/2022 0719   BILIRUBINUR NEGATIVE 07/30/2022 0719   BILIRUBINUR Negative 07/29/2022 1518   BILIRUBINUR Negative 01/29/2021 1700   KETONESUR NEGATIVE 07/30/2022 0719   PROTEINUR NEGATIVE 07/30/2022 0719   UROBILINOGEN 2.0 (A) 07/29/2022 1518   NITRITE NEGATIVE 07/30/2022 0719   LEUKOCYTESUR SMALL (A) 07/30/2022 0719   Sepsis Labs Recent Labs  Lab 07/30/22 0626  07/31/22 0108 07/31/22 0746 08/03/22 0716  WBC 5.8 6.4 6.9 5.8   Microbiology Recent Results (from the past 240 hour(s))  Urine Culture     Status: None   Collection Time: 07/29/22  3:47 PM   Specimen: Urine  Result Value Ref Range Status   MICRO NUMBER: [id number]  Final   SPECIMEN QUALITY: Adequate  Final   Sample Source URINE  Final   STATUS: FINAL  Final   ISOLATE 1:   Final    Greater than 100,000 CFU/mL of Non-uropathogenic Gram positive organism May represent colonizers from external and internal genitalia. No further testing (including susceptibility) will be performed.     Time coordinating discharge:  I have spent 35 minutes face to face with the patient and on the ward discussing the patients care, assessment, plan and disposition with other care givers. >50% of the time was devoted counseling the patient about the risks and benefits of treatment/Discharge disposition and coordinating care.   SIGNED:   Damita Lack, MD  [facility] 08/03/2022, 3:28 PM   If 7PM-7AM, please contact night-coverage

## 2022-08-03 NOTE — TOC CAGE-AID Note (Signed)
Transition of Care [facility]) - CAGE-AID Screening   Patient Details  Name: Tracy Faulkner MRN: [id number] Date of Birth: Apr 15, 1932   CAGE-AID Screening:    Have You Ever Felt You Ought to Cut Down on Your Drinking or Drug Use?: No Have People Annoyed You By [employer] Your Drinking Or Drug Use?: No Have You Felt Bad Or Guilty About Your Drinking Or Drug Use?: No Have You Ever Had a Drink or Used Drugs First Thing In The Morning to Steady Your Nerves or to Get Rid of a Hangover?: No CAGE-AID Score: 0  Substance Abuse Education Offered: No

## 2022-08-03 NOTE — Progress Notes (Signed)
PTAR here to get pt.  Family member at bedside w/belongings.  IV taken out, vitals checked, CBG on lower end, apple juice given.  Pt given pericare before taken by PTAR.  DNR paperwork w/PTAR.  No further issues/complaints/tasks needed or given.

## 2022-08-03 NOTE — Plan of Care (Signed)

## 2022-08-03 NOTE — Progress Notes (Signed)
Called SNF multiple times to report but no answer. Will call again

## 2022-08-03 NOTE — TOC Transition Note (Signed)
Transition of Care [facility]) - CM/SW Discharge Note   Patient Details  Name: Tracy Faulkner MRN: [id number] Date of Birth: 09-17-1932  Transition of Care [facility]) CM/SW Contact:  Coralee Pesa, [place] Phone Number: 08/03/2022, 4:23 PM   Clinical Narrative:    Pt to be transported to [facility] via Center. Nurse to call report to [phone]   Final next level of care: Skilled Nursing Facility Barriers to Discharge: Barriers Resolved   Patient Goals and CMS Choice Patient states their goals for this hospitalization and ongoing recovery are:: Pt agreeable to SNF CMS Medicare.gov Compare Post Acute Care list provided to:: Patient Represenative (must comment) (Daughter) Choice offered to / list presented to : Patient, Adult Children  Discharge Placement              Patient chooses bed at: [facility] Patient to be transferred to facility by: [place] Name of family member notified: Tracy Faulkner Patient and family notified of of transfer: 08/03/22  Discharge Plan and Services     Post Acute Care Choice: Deaf Tracy Faulkner                               Social Determinants of Health (SDOH) Interventions     Readmission Risk Interventions     No data to display

## 2022-08-04 DIAGNOSIS — R4189 Other symptoms and signs involving cognitive functions and awareness: Secondary | ICD-10-CM | POA: Diagnosis not present

## 2022-08-04 DIAGNOSIS — K5900 Constipation, unspecified: Secondary | ICD-10-CM | POA: Diagnosis not present

## 2022-08-04 DIAGNOSIS — S32591D Other specified fracture of right pubis, subsequent encounter for fracture with routine healing: Secondary | ICD-10-CM | POA: Diagnosis not present

## 2022-08-04 DIAGNOSIS — N1831 Chronic kidney disease, stage 3a: Secondary | ICD-10-CM | POA: Diagnosis not present

## 2022-08-04 DIAGNOSIS — K219 Gastro-esophageal reflux disease without esophagitis: Secondary | ICD-10-CM | POA: Diagnosis not present

## 2022-08-04 DIAGNOSIS — I10 Essential (primary) hypertension: Secondary | ICD-10-CM | POA: Diagnosis not present

## 2022-08-05 NOTE — Progress Notes (Signed)
This encounter was created in error - please disregard. Error   

## 2022-08-10 DIAGNOSIS — G8929 Other chronic pain: Secondary | ICD-10-CM | POA: Diagnosis not present

## 2022-08-13 DIAGNOSIS — G8929 Other chronic pain: Secondary | ICD-10-CM | POA: Diagnosis not present

## 2022-08-13 DIAGNOSIS — K219 Gastro-esophageal reflux disease without esophagitis: Secondary | ICD-10-CM | POA: Diagnosis not present

## 2022-08-13 DIAGNOSIS — S32591D Other specified fracture of right pubis, subsequent encounter for fracture with routine healing: Secondary | ICD-10-CM | POA: Diagnosis not present

## 2022-08-13 DIAGNOSIS — K5900 Constipation, unspecified: Secondary | ICD-10-CM | POA: Diagnosis not present

## 2022-08-13 DIAGNOSIS — Z79899 Other long term (current) drug therapy: Secondary | ICD-10-CM | POA: Diagnosis not present

## 2022-08-13 DIAGNOSIS — N1831 Chronic kidney disease, stage 3a: Secondary | ICD-10-CM | POA: Diagnosis not present

## 2022-08-13 DIAGNOSIS — I10 Essential (primary) hypertension: Secondary | ICD-10-CM | POA: Diagnosis not present

## 2022-08-13 DIAGNOSIS — R4189 Other symptoms and signs involving cognitive functions and awareness: Secondary | ICD-10-CM | POA: Diagnosis not present

## 2022-08-17 ENCOUNTER — Ambulatory Visit: Payer: Medicare PPO | Admitting: Family

## 2022-08-20 DIAGNOSIS — S32591D Other specified fracture of right pubis, subsequent encounter for fracture with routine healing: Secondary | ICD-10-CM | POA: Diagnosis not present

## 2022-08-20 DIAGNOSIS — Z79899 Other long term (current) drug therapy: Secondary | ICD-10-CM | POA: Diagnosis not present

## 2022-08-20 DIAGNOSIS — N1831 Chronic kidney disease, stage 3a: Secondary | ICD-10-CM | POA: Diagnosis not present

## 2022-08-20 DIAGNOSIS — I10 Essential (primary) hypertension: Secondary | ICD-10-CM | POA: Diagnosis not present

## 2022-08-20 DIAGNOSIS — K5900 Constipation, unspecified: Secondary | ICD-10-CM | POA: Diagnosis not present

## 2022-08-20 DIAGNOSIS — R4189 Other symptoms and signs involving cognitive functions and awareness: Secondary | ICD-10-CM | POA: Diagnosis not present

## 2022-08-20 DIAGNOSIS — G8929 Other chronic pain: Secondary | ICD-10-CM | POA: Diagnosis not present

## 2022-08-20 DIAGNOSIS — K219 Gastro-esophageal reflux disease without esophagitis: Secondary | ICD-10-CM | POA: Diagnosis not present

## 2022-08-26 ENCOUNTER — Encounter: Payer: Medicare PPO | Admitting: Family

## 2022-08-27 ENCOUNTER — Encounter: Payer: Medicare PPO | Admitting: Family

## 2022-08-31 ENCOUNTER — Telehealth: Payer: Self-pay

## 2022-08-31 NOTE — Telephone Encounter (Signed)
[place], Occupational Therapist with [facility] called requesting verbal orders for OT once weekly for 7 weeks.    Per [employer] standing order, verbal order given. Message will be sent to patient's provider as a FYI.

## 2022-08-31 NOTE — Telephone Encounter (Signed)
Noted  

## 2022-09-01 ENCOUNTER — Telehealth: Payer: Self-pay | Admitting: *Deleted

## 2022-09-01 ENCOUNTER — Ambulatory Visit
Admission: RE | Admit: 2022-09-01 | Discharge: 2022-09-01 | Disposition: A | Discharge status: Home / Self Care | Payer: Medicare PPO | Source: Ambulatory Visit | Attending: Family | Admitting: Family

## 2022-09-01 DIAGNOSIS — M810 Age-related osteoporosis without current pathological fracture: Secondary | ICD-10-CM | POA: Diagnosis not present

## 2022-09-01 DIAGNOSIS — Z780 Asymptomatic menopausal state: Secondary | ICD-10-CM

## 2022-09-01 NOTE — Telephone Encounter (Signed)
Hershal Coria, ST, with [facility] called requesting verbal orders for Speech Therapy 1X8weeks.   Verbal orders given.

## 2022-09-02 ENCOUNTER — Ambulatory Visit (INDEPENDENT_AMBULATORY_CARE_PROVIDER_SITE_OTHER): Payer: Medicare PPO | Admitting: Family

## 2022-09-02 ENCOUNTER — Encounter: Payer: Self-pay | Admitting: Family

## 2022-09-02 DIAGNOSIS — Z0000 Encounter for general adult medical examination without abnormal findings: Secondary | ICD-10-CM

## 2022-09-02 NOTE — Progress Notes (Signed)
This service is provided via telemedicine  No vital signs collected/recorded due to the encounter was a telemedicine visit.   Location of patient (ex: home, work):  Home.  Patient consents to a telephone visit:  Yes  Location of the provider (ex: office, home):  [employer].   Name of any referring provider:  Sladen Plancarte, Nelda Bucks, NP   Names of all persons participating in the telemedicine service and their role in the encounter:  Patient, Daughter/[name], Heriberto Antigua, [place], Annalyssa Thune, [place], NP.    Time spent on call: 8 minutes spent on the phone with Medical Assistant.       Subjective:   Tracy Faulkner is a 88 y.o. female who presents for Medicare Annual (Subsequent) preventive examination.  Review of Systems     Cardiac Risk Factors include: advanced age (>3mn, >>36women);hypertension     Objective:    Today's Vitals   09/02/22 1608  PainSc: 4    There is no height or weight on file to calculate BMI.     09/02/2022    4:05 PM 07/30/2022    5:00 PM 07/30/2022    6:22 AM 07/28/2022    2:26 PM 03/12/2022   10:27 AM 09/07/2021    9:23 AM 08/12/2021    8:40 AM  Advanced Directives  Does Patient Have a Medical Advance Directive? No No No No No No No  Would patient like information on creating a medical advance directive? No - Patient declined Yes (Inpatient - patient requests chaplain consult to create a medical advance directive) No - Patient declined No - Patient declined No - Patient declined No - Patient declined Yes (MAU/Ambulatory/Procedural Areas - Information given)      Significant value    Current Medications (verified) Outpatient Encounter Medications as of 09/02/2022  Medication Sig   acetaminophen (TYLENOL) 650 MG CR tablet Take 650 mg by mouth daily as needed for pain.   bisacodyl (DULCOLAX) 5 MG EC tablet Take 1 tablet (5 mg total) by mouth daily as needed for moderate constipation.   CALCIUM PO Take 1 capsule by mouth daily.    cholecalciferol (VITAMIN D3) 10 MCG (400 UNIT) TABS tablet Take 1 tablet (400 Units total) by mouth daily.   cycloSPORINE (RESTASIS) 0.05 % ophthalmic emulsion Place 1 drop into both eyes daily.   docusate sodium (COLACE) 100 MG capsule Take 1 capsule (100 mg total) by mouth daily.   losartan (COZAAR) 50 MG tablet Take 1 tablet by mouth once daily   Multiple Vitamin (MULTIVITAMIN) tablet Take 1 tablet by mouth daily.   Omega-3 Fatty Acids (FISH OIL PO) Take 1 capsule by mouth daily.   omeprazole (PRILOSEC) 40 MG capsule TAKE 1 CAPSULE BY MOUTH ONCE DAILY BEFORE BREAKFAST   OVER THE COUNTER MEDICATION Take 1 capsule by mouth daily. Gluco Gel Joint & Cartilage Support   OVER THE COUNTER MEDICATION Apply 1 application  topically daily as needed (pain relief). Youngevity Pain Cream   polyethylene glycol (MIRALAX) 17 g packet Take 17 g by mouth daily as needed for mild constipation.   [DISCONTINUED] oxyCODONE (OXY IR/ROXICODONE) 5 MG immediate release tablet Take 1 tablet (5 mg total) by mouth every 6 (six) hours as needed for severe pain or breakthrough pain.   No facility-administered encounter medications on file as of 09/02/2022.    Allergies (verified) Ace inhibitors, Lortab [hydrocodone-acetaminophen], Remeron [mirtazapine], and Penicillins   History: Past Medical History:  Diagnosis Date   Anemia    Arthritis  Ataxia    Back problem    Bowel trouble    Breast cancer (HCC)    Carpal tunnel syndrome    Constipation    Disturbance, sleep    Dyspnea    increased exertion    Frequent headaches    GI bleed    History of mammogram 2000   History of urinary tract infection    Hypertension    Obesity    Pain in both knees    Sepsis ([place])    Urinary incontinence    Past Surgical History:  Procedure Laterality Date   BREAST SURGERY     for cancer-right breast   COLON SURGERY     secondary to bowel blockages   EYE SURGERY     cataract surgery bilateral    gastric stapling      KNEE ARTHROPLASTY Left 10/07/2016   Procedure: LEFT TOTAL KNEE ARTHROPLASTY WITH COMPUTER NAVIGATION;  Surgeon: Rod Can, MD;  Location: [facility];  Service: Orthopedics;  Laterality: Left;   Family History  Problem Relation Age of Onset   Arthritis Mother    Diabetes Mellitus II Mother    Hypertension Mother    Breast cancer Mother    Heart disease Father    Social History   Socioeconomic History   Marital status: Widowed    Spouse name: Not on file   Number of children: 3   Years of education: 8   Highest education level: Not on file  Occupational History   Occupation: unemployed  Tobacco Use   Smoking status: Never   Smokeless tobacco: Never  Vaping Use   Vaping Use: Never used  Substance and Sexual Activity   Alcohol use: No    Alcohol/week: 0.0 standard drinks of alcohol   Drug use: No   Sexual activity: Not Currently  Other Topics Concern   Not on file  Social History Narrative   Tobacco use, amount per day now: N/A   Past tobacco use, amount per day:   How many years did you use tobacco:   Alcohol use (drinks per week): N/A   Diet: Regular, Limited salt.   Do you drink/eat things with caffeine: Yes.   Marital status:  Widowed                                What year were you married?   Do you live in a house, apartment, assisted living, condo, trailer, etc.? House.   Is it one or more stories? Yes.   How many persons live in your home? 4   Do you have pets in your home?( please list) No   Highest Level of education completed? 8th grade.   Current or past profession: Domestic, Environmental manager, Sports coach employed.   Do you exercise? Some                                 Type and how often?   Do you have a living will? No   Do you have a DNR form? No                                  If not, do you want to discuss one?   Do you have signed POA/HPOA forms? No  If so, please bring to you appointment      Do you have any difficulty bathing or dressing  yourself? No, minimal assistance.   Do you have any difficulty preparing food or eating? No, limited preparation.    Do you have any difficulty managing your medications? Yes, managed by family   Do you have any difficulty managing your finances? Yes, managed by family.   Do you have any difficulty affording your medications? No.   Social Determinants of Health   Financial Resource Strain: Not on file  Food Insecurity: Not on file  Transportation Needs: Not on file  Physical Activity: Not on file  Stress: Not on file  Social Connections: Not on file    Tobacco Counseling Counseling given: Not Answered   Clinical Intake:  Pre-visit preparation completed: No  Pain : 0-10 Pain Score: 4  Pain Type: Chronic pain Pain Location: Generalized (knees,headache,shoulders) Pain Orientation:  (generalized pain) Pain Radiating Towards: no Pain Descriptors / Indicators: Aching Pain Onset: More than a month ago Pain Frequency: Constant Pain Relieving Factors: Over the counter analgesic Effect of Pain on Daily Activities: sleep  Pain Relieving Factors: Over the counter analgesic  BMI - recorded: 26.19 Nutritional Status: BMI 25 -29 Overweight Nutritional Risks: None Diabetes: No     Diabetic? No    Activities of Daily Living    09/02/2022    4:22 PM 07/30/2022    5:00 PM  In your present state of health, do you have any difficulty performing the following activities:  Hearing? 0 0  Vision? 0 0  Difficulty concentrating or making decisions? 0 0  Walking or climbing stairs? 1 1  Comment uses a the rail   Dressing or bathing? 0 1  Doing errands, shopping? 1 1  Comment daughter Diplomatic Services operational officer and eating ? N   Using the Toilet? N   In the past six months, have you accidently leaked urine? Y   Do you have problems with loss of bowel control? N   Managing your Medications? Y   Comment daughter assist   Managing your Finances? Y   Comment Daughter Advertising copywriter or managing your Housekeeping? Y   Comment Daughter assit     Patient Care Team: Newman Waren, Nelda Bucks, NP as PCP - General (Family Medicine) Belva Crome, MD as PCP - Cardiology (Cardiology) Belva Crome, MD as Consulting Physician (Cardiology)  Indicate any recent Medical Services you may have received from other than [facility] providers in the past year (date may be approximate).     Assessment:   This is a routine wellness examination for [facility].  Hearing/Vision screen Hearing Screening - Comments:: No Hearing Concerns. Patient doesn't wear hearing-aids.  Vision Screening - Comments:: Some vision concerns. Patient wears prescription glasses. Patient last eye exam January 2023.  Dietary issues and exercise activities discussed: Current Exercise Habits: Home exercise routine, Type of exercise: walking, Time (Minutes): 10, Frequency (Times/Week): 2, Weekly Exercise (Minutes/Week): 20, Intensity: Mild, Exercise limited by: orthopedic condition(s) (knee pain)   Goals Addressed             This Visit's Progress    Patient Stated   On track    Would like do brain activities to improve her memory        Depression Screen    09/02/2022    3:59 PM 08/12/2021    8:38 AM 11/15/2016    2:16 PM  PHQ 2/9 Scores  PHQ -  2 Score 0 0 0    Fall Risk    09/02/2022    3:59 PM 07/28/2022    2:25 PM 03/12/2022   10:26 AM 09/07/2021    9:23 AM 08/12/2021    8:38 AM  Fall Risk   Falls in the past year? '1 1 1 1 1  '$ Number falls in past yr: 1 1 0 1 1  Injury with Fall? 1 1 0 0 0  Risk for fall due to : History of fall(s);Impaired balance/gait;Impaired mobility History of fall(s);Impaired balance/gait;Impaired mobility History of fall(s) History of fall(s) History of fall(s)  Follow up Falls evaluation completed;Education provided;Falls prevention discussed Falls evaluation completed;Education provided;Falls prevention discussed Falls evaluation completed;Education provided;Falls  prevention discussed Falls evaluation completed Falls evaluation completed    FALL RISK PREVENTION PERTAINING TO THE HOME:  Any stairs in or around the home? Yes  If so, are there any without handrails? No  Home free of loose throw rugs in walkways, pet beds, electrical cords, etc? No  Adequate lighting in your home to reduce risk of falls? Yes   ASSISTIVE DEVICES UTILIZED TO PREVENT FALLS:  Life alert? Yes  Use of a cane, walker or w/c? Yes  Grab bars in the bathroom? Yes  Shower chair or bench in shower?  N/A does not get shower  Elevated toilet seat or a handicapped toilet? Yes   TIMED UP AND GO:  Was the test performed? No .  Length of time to ambulate 10 feet: N/A sec.   Gait slow and steady with assistive device  Cognitive Function:        09/02/2022    4:01 PM 08/12/2021    8:42 AM  6CIT Screen  What Year? 4 points 4 points  What month? 3 points 3 points  What time? 3 points 0 points  Count back from 20 0 points 0 points  Months in reverse 4 points 0 points  Repeat phrase 10 points 8 points  Total Score 24 points 15 points    Immunizations Immunization History  Administered Date(s) Administered   Fluad Quad(high Dose 65+) 09/07/2021   Influenza, High Dose Seasonal PF 08/13/2019   Influenza,inj,quad, With Preservative 09/15/2017   Janssen (J&J) SARS-COV-2 Vaccination 02/03/2020   Pneumococcal Conjugate-13 08/18/2015   Pneumococcal Polysaccharide-23 12/13/2017   Tdap 10/11/2017   Zoster Recombinat (Shingrix) 10/11/2017, 12/13/2017   Zoster, Live 10/12/2017    TDAP status: Up to date  Flu Vaccine status: Due, Education has been provided regarding the importance of this vaccine. Advised may receive this vaccine at local pharmacy or Health Dept. Aware to provide a copy of the vaccination record if obtained from local pharmacy or Health Dept. Verbalized acceptance and understanding.  Pneumococcal vaccine status: Up to date  Covid-19 vaccine status:  Declined, Education has been provided regarding the importance of this vaccine but patient still declined. Advised may receive this vaccine at local pharmacy or Health Dept.or vaccine clinic. Aware to provide a copy of the vaccination record if obtained from local pharmacy or Health Dept. Verbalized acceptance and understanding.  Qualifies for Shingles Vaccine? Yes   Zostavax completed Yes   Shingrix Completed?: Yes  Screening Tests Health Maintenance  Topic Date Due   INFLUENZA VACCINE  06/29/2022   TETANUS/TDAP  10/12/2027   Pneumonia Vaccine 60+ Years old  Completed   DEXA SCAN  Completed   Zoster Vaccines- Shingrix  Completed   HPV VACCINES  Aged Out   COVID-19 Vaccine  Discontinued    Health  Maintenance  Health Maintenance Due  Topic Date Due   INFLUENZA VACCINE  06/29/2022    Colorectal cancer screening: No longer required.   Mammogram status: No longer required due to advance age .  Bone Density status: Completed 09/01/2022. Results reflect: Bone density results: OSTEOPOROSIS. Repeat every 5 years.  Lung Cancer Screening: (Low Dose CT Chest recommended if Age 78-80 years, 30 pack-year currently smoking OR have quit w/in 15years.) does not qualify.   Lung Cancer Screening Referral: No   Additional Screening:  Hepatitis C Screening: does not qualify; Completed No   Vision Screening: Recommended annual ophthalmology exams for early detection of glaucoma and other disorders of the eye. Is the patient up to date with their annual eye exam?  No Has appointment end of the year  Who is the provider or what is the name of the office in which the patient attends annual eye exams? Could not rememeber  If pt is not established with a provider, would they like to be referred to a provider to establish care? No .   Dental Screening: Recommended annual dental exams for proper oral hygiene  Community Resource Referral / Chronic Care Management: CRR required this visit?  No    CCM required this visit?  No      Plan:     I have personally reviewed and noted the following in the patient's chart:   Medical and social history Use of alcohol, tobacco or illicit drugs  Current medications and supplements including opioid prescriptions. Patient is not currently taking opioid prescriptions. Functional ability and status Nutritional status Physical activity Advanced directives List of other physicians Hospitalizations, surgeries, and ER visits in previous 12 months Vitals Screenings to include cognitive, depression, and falls Referrals and appointments  In addition, I have reviewed and discussed with patient certain preventive protocols, quality metrics, and best practice recommendations. A written personalized care plan for preventive services as well as general preventive health recommendations were provided to patient.     Sandrea Hughs, NP   09/02/2022   Nurse Notes: Due for Influenza vaccine

## 2022-09-02 NOTE — Patient Instructions (Signed)
Tracy Faulkner , Thank you for taking time to come for your Medicare Wellness Visit. I appreciate your ongoing commitment to your health goals. Please review the following plan we discussed and let me know if I can assist you in the future.   Screening recommendations/referrals: Colonoscopy : N/A  Mammogram : N/A  Bone Density : Up to date  Recommended yearly ophthalmology/optometry visit for glaucoma screening and checkup Recommended yearly dental visit for hygiene and checkup  Vaccinations: Influenza vaccine- due annually in September/October Pneumococcal vaccine : Up to date  Tdap vaccine : Due  Shingles vaccine : Up to date     Advanced directives: No   Conditions/risks identified: Cardiac Risk Factors include: advanced age (>38mn, >>54women);hypertension  Next appointment: 1 year    Preventive Care 626Years and Older, Female Preventive care refers to lifestyle choices and visits with your health care provider that can promote health and wellness. What does preventive care include? A yearly physical exam. This is also called an annual well check. Dental exams once or twice a year. Routine eye exams. Ask your health care provider how often you should have your eyes checked. Personal lifestyle choices, including: Daily care of your teeth and gums. Regular physical activity. Eating a healthy diet. Avoiding tobacco and drug use. Limiting alcohol use. Practicing safe sex. Taking low-dose aspirin every day. Taking vitamin and mineral supplements as recommended by your health care provider. What happens during an annual well check? The services and screenings done by your health care provider during your annual well check will depend on your age, overall health, lifestyle risk factors, and family history of disease. Counseling  Your health care provider may ask you questions about your: Alcohol use. Tobacco use. Drug use. Emotional well-being. Home and relationship  well-being. Sexual activity. Eating habits. History of falls. Memory and ability to understand (cognition). Work and work eStatistician Reproductive health. Screening  You may have the following tests or measurements: Height, weight, and BMI. Blood pressure. Lipid and cholesterol levels. These may be checked every 5 years, or more frequently if you are over 551years old. Skin check. Lung cancer screening. You may have this screening every year starting at age 1849if you have a 30-pack-year history of smoking and currently smoke or have quit within the past 15 years. Fecal occult blood test (FOBT) of the stool. You may have this test every year starting at age 86 Flexible sigmoidoscopy or colonoscopy. You may have a sigmoidoscopy every 5 years or a colonoscopy every 10 years starting at age 86 Hepatitis C blood test. Hepatitis B blood test. Sexually transmitted disease (STD) testing. Diabetes screening. This is done by checking your blood sugar (glucose) after you have not eaten for a while (fasting). You may have this done every 1-3 years. Bone density scan. This is done to screen for osteoporosis. You may have this done starting at age 86 Mammogram. This may be done every 1-2 years. Talk to your health care provider about how often you should have regular mammograms. Talk with your health care provider about your test results, treatment options, and if necessary, the need for more tests. Vaccines  Your health care provider may recommend certain vaccines, such as: Influenza vaccine. This is recommended every year. Tetanus, diphtheria, and acellular pertussis (Tdap, Td) vaccine. You may need a Td booster every 10 years. Zoster vaccine. You may need this after age 86 Pneumococcal 13-valent conjugate (PCV13) vaccine. One dose is recommended after age 86 Pneumococcal polysaccharide (  PPSV23) vaccine. One dose is recommended after age 77. Talk to your health care provider about which  screenings and vaccines you need and how often you need them. This information is not intended to replace advice given to you by your health care provider. Make sure you discuss any questions you have with your health care provider. Document Released: 12/12/2015 Document Revised: 08/04/2016 Document Reviewed: 09/16/2015 Elsevier Interactive Patient Education  2017 [place] Prevention in the Home Falls can cause injuries. They can happen to people of all ages. There are many things you can do to make your home safe and to help prevent falls. What can I do on the outside of my home? Regularly fix the edges of walkways and driveways and fix any cracks. Remove anything that might make you trip as you walk through a door, such as a raised step or threshold. Trim any bushes or trees on the path to your home. Use bright outdoor lighting. Clear any walking paths of anything that might make someone trip, such as rocks or tools. Regularly check to see if handrails are loose or broken. Make sure that both sides of any steps have handrails. Any raised decks and porches should have guardrails on the edges. Have any leaves, snow, or ice cleared regularly. Use sand or salt on walking paths during winter. Clean up any spills in your garage right away. This includes oil or grease spills. What can I do in the bathroom? Use night lights. Install grab bars by the toilet and in the tub and shower. Do not use towel bars as grab bars. Use non-skid mats or decals in the tub or shower. If you need to sit down in the shower, use a plastic, non-slip stool. Keep the floor dry. Clean up any water that spills on the floor as soon as it happens. Remove soap buildup in the tub or shower regularly. Attach bath mats securely with double-sided non-slip rug tape. Do not have throw rugs and other things on the floor that can make you trip. What can I do in the bedroom? Use night lights. Make sure that you have a  light by your bed that is easy to reach. Do not use any sheets or blankets that are too big for your bed. They should not hang down onto the floor. Have a firm chair that has side arms. You can use this for support while you get dressed. Do not have throw rugs and other things on the floor that can make you trip. What can I do in the kitchen? Clean up any spills right away. Avoid walking on wet floors. Keep items that you use a lot in easy-to-reach places. If you need to reach something above you, use a strong step stool that has a grab bar. Keep electrical cords out of the way. Do not use floor polish or wax that makes floors slippery. If you must use wax, use non-skid floor wax. Do not have throw rugs and other things on the floor that can make you trip. What can I do with my stairs? Do not leave any items on the stairs. Make sure that there are handrails on both sides of the stairs and use them. Fix handrails that are broken or loose. Make sure that handrails are as long as the stairways. Check any carpeting to make sure that it is firmly attached to the stairs. Fix any carpet that is loose or worn. Avoid having throw rugs at the top or  bottom of the stairs. If you do have throw rugs, attach them to the floor with carpet tape. Make sure that you have a light switch at the top of the stairs and the bottom of the stairs. If you do not have them, ask someone to add them for you. What else can I do to help prevent falls? Wear shoes that: Do not have high heels. Have rubber bottoms. Are comfortable and fit you well. Are closed at the toe. Do not wear sandals. If you use a stepladder: Make sure that it is fully opened. Do not climb a closed stepladder. Make sure that both sides of the stepladder are locked into place. Ask someone to hold it for you, if possible. Clearly mark and make sure that you can see: Any grab bars or handrails. First and last steps. Where the edge of each step  is. Use tools that help you move around (mobility aids) if they are needed. These include: Canes. Walkers. Scooters. Crutches. Turn on the lights when you go into a dark area. Replace any light bulbs as soon as they burn out. Set up your furniture so you have a clear path. Avoid moving your furniture around. If any of your floors are uneven, fix them. If there are any pets around you, be aware of where they are. Review your medicines with your doctor. Some medicines can make you feel dizzy. This can increase your chance of falling. Ask your doctor what other things that you can do to help prevent falls. This information is not intended to replace advice given to you by your health care provider. Make sure you discuss any questions you have with your health care provider. Document Released: 09/11/2009 Document Revised: 04/22/2016 Document Reviewed: 12/20/2014 Elsevier Interactive Patient Education  2017 [employer].

## 2022-09-03 ENCOUNTER — Encounter: Payer: Medicare PPO | Admitting: Family

## 2022-09-06 ENCOUNTER — Other Ambulatory Visit: Payer: Medicare PPO

## 2022-09-06 ENCOUNTER — Other Ambulatory Visit: Payer: Self-pay | Admitting: Family

## 2022-09-06 DIAGNOSIS — I10 Essential (primary) hypertension: Secondary | ICD-10-CM

## 2022-09-06 DIAGNOSIS — E785 Hyperlipidemia, unspecified: Secondary | ICD-10-CM

## 2022-09-06 DIAGNOSIS — D509 Iron deficiency anemia, unspecified: Secondary | ICD-10-CM

## 2022-09-12 NOTE — Progress Notes (Unsigned)
Cardiology Office Note:    Date:  09/14/2022   ID:  Tracy, Faulkner 10-Oct-1932, MRN [id number]  PCP:  Sandrea Hughs, NP  Cardiologist:  Sinclair Grooms, MD   Referring MD: Sandrea Hughs, NP   Chief Complaint  Patient presents with   Congestive Heart Failure   Follow-up    Edema Chest pain Hypertension    History of Present Illness:    Tracy Faulkner is a 88 y.o. female with a hx of palpitations, hypertension, obesity, and chest pain. Recent benign Holter monitor.  Recent lower extremity edema, dyspnea on exertion and "bendopnea" treated with Lasix and returning now for follow-up.     Was set up to return by [employer].  She has no complaints.  She was started on furosemide, 20 mg a day for several days back in May by Sharyn Lull and this led to resolution of edema.  Shortness of breath improved.  They have subsequently stopped taking the medication.  She has various other cardiac complaints including discomfort in her right axilla and discomfort in the right arm.  She has had what appears to be a tremendous amount of weight loss compared to several years ago and subsequently 5 additional pounds since being seen in September.  Weight was 176 pounds in March 2022.  Past Medical History:  Diagnosis Date   Anemia    Arthritis    Ataxia    Back problem    Bowel trouble    Breast cancer ([place])    Carpal tunnel syndrome    Constipation    Disturbance, sleep    Dyspnea    increased exertion    Frequent headaches    GI bleed    History of mammogram 2000   History of urinary tract infection    Hypertension    Obesity    Pain in both knees    Sepsis ([place])    Urinary incontinence     Past Surgical History:  Procedure Laterality Date   BREAST SURGERY     for cancer-right breast   COLON SURGERY     secondary to bowel blockages   EYE SURGERY     cataract surgery bilateral    gastric stapling     KNEE ARTHROPLASTY Left 10/07/2016   Procedure: LEFT TOTAL  KNEE ARTHROPLASTY WITH COMPUTER NAVIGATION;  Surgeon: Rod Can, MD;  Location: [facility];  Service: Orthopedics;  Laterality: Left;    Current Medications: Current Meds  Medication Sig   acetaminophen (TYLENOL) 650 MG CR tablet Take 650 mg by mouth daily as needed for pain.   bisacodyl (DULCOLAX) 5 MG EC tablet Take 1 tablet (5 mg total) by mouth daily as needed for moderate constipation.   CALCIUM PO Take 1 capsule by mouth daily.   cholecalciferol (VITAMIN D3) 10 MCG (400 UNIT) TABS tablet Take 1 tablet (400 Units total) by mouth daily.   cycloSPORINE (RESTASIS) 0.05 % ophthalmic emulsion Place 1 drop into both eyes daily.   docusate sodium (COLACE) 100 MG capsule Take 1 capsule (100 mg total) by mouth daily.   losartan (COZAAR) 50 MG tablet Take 1 tablet by mouth once daily   Multiple Vitamin (MULTIVITAMIN) tablet Take 1 tablet by mouth daily.   Omega-3 Fatty Acids (FISH OIL PO) Take 1 capsule by mouth daily.   omeprazole (PRILOSEC) 40 MG capsule TAKE 1 CAPSULE BY MOUTH ONCE DAILY BEFORE BREAKFAST   OVER THE COUNTER MEDICATION Take 1 capsule by mouth daily. Gluco Gel  Joint & Cartilage Support   OVER THE COUNTER MEDICATION Apply 1 application  topically daily as needed (pain relief). Youngevity Pain Cream   polyethylene glycol (MIRALAX) 17 g packet Take 17 g by mouth daily as needed for mild constipation.     Allergies:   Ace inhibitors, Lortab [hydrocodone-acetaminophen], Remeron [mirtazapine], and Penicillins   Social History   Socioeconomic History   Marital status: Widowed    Spouse name: Not on file   Number of children: 3   Years of education: 8   Highest education level: Not on file  Occupational History   Occupation: unemployed  Tobacco Use   Smoking status: Never   Smokeless tobacco: Never  Vaping Use   Vaping Use: Never used  Substance and Sexual Activity   Alcohol use: No    Alcohol/week: 0.0 standard drinks of alcohol   Drug use: No   Sexual activity: Not  Currently  Other Topics Concern   Not on file  Social History Narrative   Tobacco use, amount per day now: N/A   Past tobacco use, amount per day:   How many years did you use tobacco:   Alcohol use (drinks per week): N/A   Diet: Regular, Limited salt.   Do you drink/eat things with caffeine: Yes.   Marital status:  Widowed                                What year were you married?   Do you live in a house, apartment, assisted living, condo, trailer, etc.? House.   Is it one or more stories? Yes.   How many persons live in your home? 4   Do you have pets in your home?( please list) No   Highest Level of education completed? 8th grade.   Current or past profession: Domestic, Environmental manager, Sports coach employed.   Do you exercise? Some                                 Type and how often?   Do you have a living will? No   Do you have a DNR form? No                                  If not, do you want to discuss one?   Do you have signed POA/HPOA forms? No                       If so, please bring to you appointment      Do you have any difficulty bathing or dressing yourself? No, minimal assistance.   Do you have any difficulty preparing food or eating? No, limited preparation.    Do you have any difficulty managing your medications? Yes, managed by family   Do you have any difficulty managing your finances? Yes, managed by family.   Do you have any difficulty affording your medications? No.   Social Determinants of Health   Financial Resource Strain: Not on file  Food Insecurity: Not on file  Transportation Needs: Not on file  Physical Activity: Not on file  Stress: Not on file  Social Connections: Not on file     Family History: The patient's family history includes Arthritis in her mother; Breast cancer in her mother;  Diabetes Mellitus II in her mother; Heart disease in her father; Hypertension in her mother.  ROS:   Please see the history of present illness.    She wonders why she is  here.  She has had no particular complaints.  All other systems reviewed and are negative.  EKGs/Labs/Other Studies Reviewed:    The following studies were reviewed today: No new data  EKG:  EKG not repeated  Recent Labs: 03/08/2022: TSH 1.00 07/30/2022: ALT 18 08/03/2022: BUN 17; Creatinine, Ser 1.01; Hemoglobin 10.8; Magnesium 2.2; Platelets 197; Potassium 4.1; Sodium 143  Recent Lipid Panel    Component Value Date/Time   CHOL 195 03/08/2022 1146   TRIG 57 03/08/2022 1146   HDL 59 03/08/2022 1146   CHOLHDL 3.3 03/08/2022 1146   LDLCALC 122 (H) 03/08/2022 1146    Physical Exam:    VS:  BP 116/72   Pulse 80   Ht 5' 4.5" (1.638 m)   Wt 150 lb 9.6 oz (68.3 kg)   SpO2 100%   BMI 25.45 kg/m     Wt Readings from Last 3 Encounters:  09/14/22 150 lb 9.6 oz (68.3 kg)  07/31/22 155 lb (70.3 kg)  07/28/22 155 lb (70.3 kg)     GEN: Elderly and now down approximately 26 pounds since March 2022.. No acute distress HEENT: Normal NECK: No JVD. LYMPHATICS: No lymphadenopathy CARDIAC: No murmur. RRR no gallop, or edema. VASCULAR:  Normal Pulses. No bruits. RESPIRATORY:  Clear to auscultation without rales, wheezing or rhonchi  ABDOMEN: Soft, non-tender, non-distended, No pulsatile mass, MUSCULOSKELETAL: No deformity  SKIN: Warm and dry NEUROLOGIC:  Alert and oriented x 3 PSYCHIATRIC:  Normal affect   ASSESSMENT:    1. Chronic heart failure with preserved ejection fraction (HFpEF) ([place])   2. Dyspnea on exertion   3. Leg swelling   4. Essential hypertension    PLAN:    In order of problems listed above:  No evidence of volume overload.  She stopped taking Lasix.  No recurrence of edema. Denies dyspnea Legs and no longer swelling Blood pressure is low normal on Cozaar 50 mg/day.   Clinical observation.  Return to cardiology for extremity swelling or shortness of breath.  Encouraged him to speak with primary care about determining whether the 25 pound weight loss should be  evaluated.   Medication Adjustments/Labs and Tests Ordered: Current medicines are reviewed at length with the patient today.  Concerns regarding medicines are outlined above.  No orders of the defined types were placed in this encounter.  No orders of the defined types were placed in this encounter.   Patient Instructions  Medication Instructions:  Your physician recommends that you continue on your current medications as directed. Please refer to the Current Medication list given to you today.  *If you need a refill on your cardiac medications before your next appointment, please call your pharmacy*  Lab Work: NONE  Testing/Procedures: NONE  Important Information About Sugar         Signed, Sinclair Grooms, MD  09/14/2022 4:59 PM    [place]

## 2022-09-13 ENCOUNTER — Ambulatory Visit: Payer: Medicare PPO | Admitting: Family

## 2022-09-14 ENCOUNTER — Ambulatory Visit: Payer: Medicare PPO | Attending: Interventional Cardiology | Admitting: Interventional Cardiology

## 2022-09-14 ENCOUNTER — Encounter: Payer: Self-pay | Admitting: Interventional Cardiology

## 2022-09-14 VITALS — BP 116/72 | HR 80 | Ht 64.5 in | Wt 150.6 lb

## 2022-09-14 DIAGNOSIS — I5032 Chronic diastolic (congestive) heart failure: Secondary | ICD-10-CM

## 2022-09-14 DIAGNOSIS — M7989 Other specified soft tissue disorders: Secondary | ICD-10-CM

## 2022-09-14 DIAGNOSIS — R0609 Other forms of dyspnea: Secondary | ICD-10-CM

## 2022-09-14 DIAGNOSIS — I10 Essential (primary) hypertension: Secondary | ICD-10-CM | POA: Diagnosis not present

## 2022-09-14 NOTE — Patient Instructions (Signed)
Medication Instructions:  Your physician recommends that you continue on your current medications as directed. Please refer to the Current Medication list given to you today.  *If you need a refill on your cardiac medications before your next appointment, please call your pharmacy*  Lab Work: NONE  Testing/Procedures: NONE  Important Information About Sugar

## 2022-09-20 ENCOUNTER — Encounter: Payer: Medicare PPO | Admitting: Family

## 2022-09-24 ENCOUNTER — Encounter: Payer: Self-pay | Admitting: Family

## 2022-09-24 ENCOUNTER — Ambulatory Visit: Payer: Medicare PPO | Admitting: Family

## 2022-09-24 VITALS — BP 130/90 | HR 70 | Temp 97.8°F | Resp 16 | Ht 64.5 in | Wt 152.2 lb

## 2022-09-24 DIAGNOSIS — I10 Essential (primary) hypertension: Secondary | ICD-10-CM | POA: Diagnosis not present

## 2022-09-24 DIAGNOSIS — Z23 Encounter for immunization: Secondary | ICD-10-CM

## 2022-09-24 DIAGNOSIS — E785 Hyperlipidemia, unspecified: Secondary | ICD-10-CM | POA: Diagnosis not present

## 2022-09-24 DIAGNOSIS — H04129 Dry eye syndrome of unspecified lacrimal gland: Secondary | ICD-10-CM | POA: Diagnosis not present

## 2022-09-24 MED ORDER — LOSARTAN POTASSIUM 50 MG PO TABS: 50.0000 mg | ORAL_TABLET | Freq: Every day | ORAL | 2 refills | Status: DC

## 2022-09-24 MED ORDER — CYCLOSPORINE 0.05 % OP EMUL: 1.0000 [drp] | Freq: Every day | OPHTHALMIC | 3 refills | Status: AC

## 2022-09-24 NOTE — Progress Notes (Signed)
Provider: Marlowe Sax FNP-C   Sabel Hornbeck, Nelda Bucks, NP  Patient Care Team: Advait Buice, Nelda Bucks, NP as PCP - General (Family Medicine) Belva Crome, MD as PCP - Cardiology (Cardiology) Belva Crome, MD as Consulting Physician (Cardiology)  Extended Emergency Contact Information Primary Emergency Contact: [place] Phone: [phone] Mobile Phone: [phone] Relation: Daughter  Code Status:  Full Code  Goals of care: Advanced Directive information    09/24/2022    2:34 PM  Advanced Directives  Does Patient Have a Medical Advance Directive? Yes  Type of Advance Directive Out of facility DNR (pink MOST or yellow form)  Does patient want to make changes to medical advance directive? No - Patient declined  Would patient like information on creating a medical advance directive? No - Patient declined     Chief Complaint  Patient presents with   Medical Management of Chronic Issues    6 month follow up.   Immunizations    Discuss the need for Influenza vaccine.    HPI:  Pt is a 88 y.o. female seen today for 6 month for medical management of chronic diseases.    Dry eye - she complains of ongoing symptoms of dry eye requests refills on eyedrops.  Has follow-up with ophthalmologist.  Hypertension -no home blood pressure readings for review. Taking meds as advised.Blood pressure stable today.  Osteoporosis - recent bone density done 09/01/2022 indicated osteoporosis on Left Femur Neck -3.0 results discussed with patient and daughter.  Would like to continue on calcium and vitamin D supplement for now.  Declined Fosamax.   Past Medical History:  Diagnosis Date   Anemia    Arthritis    Ataxia    Back problem    Bowel trouble    Breast cancer ([facility])    Carpal tunnel syndrome    Constipation    Disturbance, sleep    Dyspnea    increased exertion    Frequent headaches    GI bleed    History of mammogram 2000   History of urinary  tract infection    Hypertension    Obesity    Pain in both knees    Sepsis ([place])    Urinary incontinence    Past Surgical History:  Procedure Laterality Date   BREAST SURGERY     for cancer-right breast   COLON SURGERY     secondary to bowel blockages   EYE SURGERY     cataract surgery bilateral    gastric stapling     KNEE ARTHROPLASTY Left 10/07/2016   Procedure: LEFT TOTAL KNEE ARTHROPLASTY WITH COMPUTER NAVIGATION;  Surgeon: Rod Can, MD;  Location: [facility];  Service: Orthopedics;  Laterality: Left;    Allergies  Allergen Reactions   Ace Inhibitors Cough   Lortab [Hydrocodone-Acetaminophen] Other (See Comments)    Bad dreams   Remeron [Mirtazapine] Other (See Comments)    Hallucinations (can take 1/2 tablet--7.5 mg), not 15 mg   Penicillins Rash and Other (See Comments)    Throat swelling, Has patient had a PCN reaction causing immediate rash, facial/tongue/throat swelling, SOB or lightheadedness with hypotension: Yes Has patient had a PCN reaction causing severe rash involving mucus membranes or skin necrosis: No Has patient had a PCN reaction that required hospitalization No Has patient had a PCN reaction occurring within the last 10 years: No If all of the above answers are "NO", then may proceed with Cephalosporin use.     Allergies as of  09/24/2022       Reactions   Ace Inhibitors Cough   Lortab [hydrocodone-acetaminophen] Other (See Comments)   Bad dreams   Remeron [mirtazapine] Other (See Comments)   Hallucinations (can take 1/2 tablet--7.5 mg), not 15 mg   Penicillins Rash, Other (See Comments)   Throat swelling, Has patient had a PCN reaction causing immediate rash, facial/tongue/throat swelling, SOB or lightheadedness with hypotension: Yes Has patient had a PCN reaction causing severe rash involving mucus membranes or skin necrosis: No Has patient had a PCN reaction that required hospitalization No Has patient had a PCN reaction occurring within the  last 10 years: No If all of the above answers are "NO", then may proceed with Cephalosporin use.        Medication List        Accurate as of September 24, 2022  2:43 PM. If you have any questions, ask your nurse or doctor.          STOP taking these medications    docusate sodium 100 MG capsule Commonly known as: COLACE Stopped by: Sandrea Hughs, NP       TAKE these medications    acetaminophen 650 MG CR tablet Commonly known as: TYLENOL Take 650 mg by mouth daily as needed for pain.   bisacodyl 5 MG EC tablet Commonly known as: DULCOLAX Take 1 tablet (5 mg total) by mouth daily as needed for moderate constipation.   CALCIUM PO Take 1 capsule by mouth daily.   cholecalciferol 10 MCG (400 UNIT) Tabs tablet Commonly known as: VITAMIN D3 Take 1 tablet (400 Units total) by mouth daily.   cycloSPORINE 0.05 % ophthalmic emulsion Commonly known as: RESTASIS Place 1 drop into both eyes daily.   FISH OIL PO Take 1 capsule by mouth daily.   losartan 50 MG tablet Commonly known as: COZAAR Take 1 tablet by mouth once daily   MiraLax 17 g packet Generic drug: polyethylene glycol Take 17 g by mouth daily as needed for mild constipation.   multivitamin tablet Take 1 tablet by mouth daily.   omeprazole 40 MG capsule Commonly known as: PRILOSEC TAKE 1 CAPSULE BY MOUTH ONCE DAILY BEFORE BREAKFAST   OVER THE COUNTER MEDICATION Take 1 capsule by mouth daily. Gluco Gel Joint & Cartilage Support   OVER THE COUNTER MEDICATION Apply 1 application  topically daily as needed (pain relief). Youngevity Pain Cream        Review of Systems  Constitutional:  Negative for appetite change, chills, fatigue, fever and unexpected weight change.  HENT:  Negative for congestion, dental problem, ear discharge, ear pain, facial swelling, hearing loss, nosebleeds, postnasal drip, rhinorrhea, sinus pressure, sinus pain, sneezing, sore throat, tinnitus and trouble swallowing.    Eyes:  Negative for pain, discharge, redness, itching and visual disturbance.  Respiratory:  Negative for cough, chest tightness, shortness of breath and wheezing.   Cardiovascular:  Negative for chest pain, palpitations and leg swelling.  Gastrointestinal:  Negative for abdominal distention, abdominal pain, blood in stool, constipation, diarrhea, nausea and vomiting.  Endocrine: Negative for cold intolerance, heat intolerance, polydipsia, polyphagia and polyuria.  Genitourinary:  Negative for difficulty urinating, dysuria, flank pain, frequency and urgency.  Musculoskeletal:  Positive for arthralgias and gait problem. Negative for back pain, joint swelling, myalgias, neck pain and neck stiffness.       Right knee   Skin:  Negative for color change, pallor, rash and wound.  Neurological:  Negative for dizziness, syncope, speech difficulty, weakness, light-headedness,  numbness and headaches.  Hematological:  Does not bruise/bleed easily.  Psychiatric/Behavioral:  Negative for agitation, behavioral problems, confusion, hallucinations, self-injury, sleep disturbance and suicidal ideas. The patient is not nervous/anxious.     Immunization History  Administered Date(s) Administered   Fluad Quad(high Dose 65+) 09/07/2021   Influenza, High Dose Seasonal PF 08/13/2019   Influenza,inj,quad, With Preservative 09/15/2017   Janssen (J&J) SARS-COV-2 Vaccination 02/03/2020   Pneumococcal Conjugate-13 08/18/2015   Pneumococcal Polysaccharide-23 12/13/2017   Tdap 10/11/2017   Zoster Recombinat (Shingrix) 10/11/2017, 12/13/2017   Zoster, Live 10/12/2017   Pertinent  Health Maintenance Due  Topic Date Due   INFLUENZA VACCINE  06/29/2022   DEXA SCAN  Completed      08/02/2022   10:00 AM 08/02/2022    8:00 PM 08/03/2022    9:00 AM 09/02/2022    3:59 PM 09/24/2022    2:33 PM  [place] in the past year?    1 1  Was there an injury with Fall?    1 1  Fall Risk Category Calculator    3 3  Fall  Risk Category    High High  Patient Fall Risk Level High fall risk High fall risk High fall risk High fall risk High fall risk  Patient at Risk for Falls Due to    History of fall(s);Impaired balance/gait;Impaired mobility History of fall(s);Impaired balance/gait;Impaired mobility  Fall risk Follow up    Falls evaluation completed;Education provided;Falls prevention discussed Falls evaluation completed;Education provided;Falls prevention discussed   Functional Status Survey:    Vitals:   09/24/22 1427  BP: (!) 130/90  Pulse: 70  Resp: 16  Temp: 97.8 F (36.6 C)  SpO2: 93%  Weight: 152 lb 3.2 oz (69 kg)  Height: 5' 4.5" (1.638 m)   Body mass index is 25.72 kg/m. Physical Exam Vitals reviewed.  Constitutional:      General: She is not in acute distress.    Appearance: Normal appearance. She is normal weight. She is not ill-appearing or diaphoretic.  HENT:     Head: Normocephalic.     Right Ear: Tympanic membrane, ear canal and external ear normal. There is no impacted cerumen.     Left Ear: Tympanic membrane, ear canal and external ear normal. There is no impacted cerumen.     Nose: Nose normal. No congestion or rhinorrhea.     Mouth/Throat:     Mouth: Mucous membranes are moist.     Pharynx: Oropharynx is clear. No oropharyngeal exudate or posterior oropharyngeal erythema.  Eyes:     General: No scleral icterus.       Right eye: No discharge.        Left eye: No discharge.     Extraocular Movements: Extraocular movements intact.     Conjunctiva/sclera: Conjunctivae normal.     Pupils: Pupils are equal, round, and reactive to light.  Neck:     Vascular: No carotid bruit.  Cardiovascular:     Rate and Rhythm: Normal rate and regular rhythm.     Pulses: Normal pulses.     Heart sounds: Normal heart sounds. No murmur heard.    No friction rub. No gallop.  Pulmonary:     Effort: Pulmonary effort is normal. No respiratory distress.     Breath sounds: Normal breath sounds.  No wheezing, rhonchi or rales.  Chest:     Chest wall: No tenderness.  Abdominal:     General: Bowel sounds are normal. There is no distension.  Palpations: Abdomen is soft. There is no mass.     Tenderness: There is no abdominal tenderness. There is no right CVA tenderness, left CVA tenderness, guarding or rebound.  Musculoskeletal:        General: No swelling or tenderness. Normal range of motion.     Cervical back: Normal range of motion. No rigidity or tenderness.     Right lower leg: No edema.     Left lower leg: No edema.     Comments: Unsteady gait   Lymphadenopathy:     Cervical: No cervical adenopathy.  Skin:    General: Skin is warm and dry.     Coloration: Skin is not pale.     Findings: No bruising, erythema, lesion or rash.  Neurological:     Mental Status: She is alert and oriented to person, place, and time. Mental status is at baseline.     Cranial Nerves: No cranial nerve deficit.     Sensory: No sensory deficit.     Motor: No weakness.     Coordination: Coordination normal.     Gait: Gait abnormal.  Psychiatric:        Mood and Affect: Mood normal.        Speech: Speech normal.        Behavior: Behavior normal.        Thought Content: Thought content normal.        Judgment: Judgment normal.     Labs reviewed: Recent Labs    07/31/22 0108 07/31/22 0746 08/03/22 0716  NA 141 140 143  K 4.1 4.1 4.1  CL 113* 111 106  CO2 23 20* 26  GLUCOSE 88 78 106*  BUN '8 10 17  '$ CREATININE 0.92 0.95 1.01*  CALCIUM 8.9 9.0 10.0  MG  --  1.9 2.2   Recent Labs    03/08/22 1146 07/30/22 0626  AST 26 28  ALT 19 18  ALKPHOS  --  60  BILITOT 0.7 0.9  PROT 7.0 7.0  ALBUMIN  --  3.5   Recent Labs    03/08/22 1146 07/28/22 1507 07/30/22 0626 07/31/22 0108 07/31/22 0746 08/03/22 0716  WBC 4.5 5.3 5.8 6.4 6.9 5.8  NEUTROABS 2,660 3,525 3.9  --   --   --   HGB 10.4* 10.8* 10.6* 10.0* 10.5* 10.8*  HCT 33.3* 34.3* 34.1* 30.5* 32.8* 33.5*  MCV 81.4 81.1  82.6 79.4* 80.4 80.1  PLT 144 174 177 144* 166 197   Lab Results  Component Value Date   TSH 1.00 03/08/2022   No results found for: "HGBA1C" Lab Results  Component Value Date   CHOL 195 03/08/2022   HDL 59 03/08/2022   LDLCALC 122 (H) 03/08/2022   TRIG 57 03/08/2022   CHOLHDL 3.3 03/08/2022    Significant Diagnostic Results in last 30 days:  DG BONE DENSITY (DXA)  Result Date: 09/01/2022 EXAM: DUAL X-RAY ABSORPTIOMETRY (DXA) FOR BONE MINERAL DENSITY IMPRESSION: Referring Physician:  Nelda Bucks [place] Your patient completed a bone mineral density test using GE Lunar iDXA system (analysis version: 16). Technologist: sec PATIENT: Name: Tracy Faulkner, [place] Patient ID: [id number] Birth Date: 1932-02-01 Height: 60.5 in. Sex: Female Measured: 09/01/2022 Weight: 142.0 lbs. Indications: Advanced Age, Estrogen Deficient, Postmenopausal Fractures: Pelvis Treatments: Calcium (E943.0), Vitamin D (E933.5) ASSESSMENT: The BMD measured at Femur Total Left is 0.625 g/cm2 with a T-score of -3.0. This patient is considered osteoporotic according to [facility]) criteria. The quality of the exam is good. The  lumbar spine was excluded due to degenerative changes. Site Region Measured Date Measured Age YA BMD Significant CHANGE T-score DualFemur Total Left 09/01/2022 88.3 -3.0 0.625 g/cm2 DualFemur Total Mean 09/01/2022 88.3 -2.7 0.662 g/cm2 Left Forearm Radius 33% 09/01/2022 88.3 -2.4 0.668 g/cm2 World Health Organization [facility]) criteria for post-menopausal, Caucasian Women: Normal       T-score at or above -1 SD Osteopenia   T-score between -1 and -2.5 SD Osteoporosis T-score at or below -2.5 SD RECOMMENDATION: 1. All patients should optimize calcium and vitamin D intake. 2. Consider FDA-approved medical therapies in postmenopausal women and men aged 72 years and older, based on the following: a. A hip or vertebral (clinical or morphometric) fracture. b. T-score = -2.5 at the femoral neck or spine after  appropriate evaluation to exclude secondary causes. c. Low bone mass (T-score between -1.0 and -2.5 at the femoral neck or spine) and a 10-year probability of a hip fracture = 3% or a 10-year probability of a major osteoporosis-related fracture = 20% based on the [place]-adapted WHO algorithm. d. Clinician judgment and/or patient preferences may indicate treatment for people with 10-year fracture probabilities above or below these levels. FOLLOW-UP: Patients with diagnosis of osteoporosis or at high risk for fracture should have regular bone mineral density tests. Patients eligible for Medicare are allowed routine testing every 2 years. The testing frequency can be increased to one year for patients who have rapidly progressing disease, are receiving or discontinuing medical therapy to restore bone mass, or have additional risk factors. I have reviewed this study and agree with the findings. [facility] Electronically Signed   By: Ammie Ferrier M.D.   On: 09/01/2022 15:44    Assessment/Plan  1. Need for influenza vaccination Afebrile - Flut shot administered by CMA no acute reaction reported.  - Flu Vaccine QUAD High Dose(Fluad)  2. Essential hypertension Blood pressure is above for age. -Continue on losartan - losartan (COZAAR) 50 MG tablet; Take 1 tablet (50 mg total) by mouth daily.  Dispense: 90 tablet; Refill: 2  3. Hyperlipidemia LDL goal <100 LDL at goal Continue on omega-3 fatty acids  4. Dry eye Continue on Restatsis  - cycloSPORINE (RESTASIS) 0.05 % ophthalmic emulsion; Place 1 drop into both eyes daily.  Dispense: 0.4 mL; Refill: 3  Family/ staff Communication: Reviewed plan of care with patient verbalized understanding  Labs/tests ordered: None   Next Appointment : Return in about 6 months (around 03/26/2023) for fasting labs in one week.   Sandrea Hughs, NP

## 2022-10-01 ENCOUNTER — Telehealth: Payer: Self-pay

## 2022-10-01 NOTE — Telephone Encounter (Signed)
Tracy Faulkner with [name] called requesting verbal orders to extend [place] for 1 week.  I gave verbal orders.

## 2022-10-03 NOTE — Progress Notes (Signed)
  This encounter was created in error - please disregard. No show 

## 2022-10-18 ENCOUNTER — Ambulatory Visit: Payer: Medicare PPO | Admitting: Podiatry

## 2022-10-19 ENCOUNTER — Ambulatory Visit: Payer: Medicare PPO | Admitting: Podiatry

## 2022-10-19 ENCOUNTER — Encounter: Payer: Self-pay | Admitting: Podiatry

## 2022-10-19 DIAGNOSIS — M79675 Pain in left toe(s): Secondary | ICD-10-CM | POA: Diagnosis not present

## 2022-10-19 DIAGNOSIS — B351 Tinea unguium: Secondary | ICD-10-CM | POA: Diagnosis not present

## 2022-10-19 DIAGNOSIS — M79674 Pain in right toe(s): Secondary | ICD-10-CM | POA: Diagnosis not present

## 2022-10-19 DIAGNOSIS — L84 Corns and callosities: Secondary | ICD-10-CM

## 2022-10-19 DIAGNOSIS — Q828 Other specified congenital malformations of skin: Secondary | ICD-10-CM

## 2022-10-19 DIAGNOSIS — M79671 Pain in right foot: Secondary | ICD-10-CM

## 2022-10-19 NOTE — Progress Notes (Signed)
  Subjective:  Patient ID: Tracy Faulkner, female    DOB: [birth year],  MRN: [id number]  Tracy Faulkner presents to clinic today for painful thick toenails that are difficult to trim. Pain interferes with ambulation. Aggravating factors include wearing enclosed shoe gear. Pain is relieved with periodic professional debridement.  Chief Complaint  Patient presents with   foot care    Patient is here for routine foot care, primary care doctor is Dr. Ludwig Clarks last seen 3 months ago.   Her daughter is present during today's visit. She has been soaking her Mom's feet and using moisturizer since last visit.  New problem(s): None.   PCP is [place], Nelda Bucks, NP.  Allergies  Allergen Reactions   Ace Inhibitors Cough   Lortab [Hydrocodone-Acetaminophen] Other (See Comments)    Bad dreams   Remeron [Mirtazapine] Other (See Comments)    Hallucinations (can take 1/2 tablet--7.5 mg), not 15 mg   Penicillins Rash and Other (See Comments)    Throat swelling, Has patient had a PCN reaction causing immediate rash, facial/tongue/throat swelling, SOB or lightheadedness with hypotension: Yes Has patient had a PCN reaction causing severe rash involving mucus membranes or skin necrosis: No Has patient had a PCN reaction that required hospitalization No Has patient had a PCN reaction occurring within the last 10 years: No If all of the above answers are "NO", then may proceed with Cephalosporin use.     Review of Systems: Negative except as noted in the HPI.  Objective: No changes noted in today's physical examination.  DANNY ZIMNY is a pleasant 88 y.o. female WD, WN in NAD. AAO x 3. Vascular Capillary refill time to digits immediate b/l. Palpable DP pulse(s) b/l lower extremities. Palpable PT pulse(s) b/l lower extremities Pedal hair sparse. Lower extremity skin temperature gradient within normal limits. No cyanosis or clubbing noted.  Neurologic Normal speech. Oriented to person, place, and time.  Protective sensation intact 5/5 intact bilaterally with 10g monofilament b/l.  Dermatologic Pedal skin with normal turgor, texture and tone b/l lower extremities. No open wounds b/l lower extremities. No interdigital macerations b/l lower extremities.   Toenails 1-5 b/l elongated, discolored, dystrophic, thickened, crumbly with subungual debris and tenderness to dorsal palpation.   Much improved with resolving hyperkeratotic lesion(s) 1st metatarsal head left foot and resolved HKT posteromedial heel RLE.  No erythema, no edema, no drainage, no fluctuance.   Minimal porokeratotic lesion(s) submet head 1 right foot. No erythema, no edema, no drainage, no fluctuance.  Orthopedic: Normal muscle strength 5/5 to all lower extremity muscle groups bilaterally. Hallux valgus with bunion deformity noted b/l lower extremities. Hammertoe(s) noted to the 2-5 bilaterally. 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   Radiographs: None Assessment/Plan: 1. Pain due to onychomycosis of toenails of both feet     No orders of the defined types were placed in this encounter.   -Patient was evaluated and treated. All patient's and/or POA's questions/concerns answered on today's visit. -Continue moisturizer to both feet daily. -Patient to continue soft, supportive shoe gear daily. -Toenails 1-5 b/l were debrided in length and girth with sterile nail nippers and dremel without iatrogenic bleeding.  -Patient/POA to call should there be question/concern in the interim.   Return in about 3 months (around 01/19/2023).  Marzetta Board, DPM

## 2022-11-10 ENCOUNTER — Telehealth: Payer: Self-pay

## 2022-11-10 NOTE — Telephone Encounter (Signed)
Noted  

## 2022-11-10 NOTE — Telephone Encounter (Signed)
Daughter is going to come by later and drop off Handicap Placecard to office.

## 2022-11-23 ENCOUNTER — Encounter: Payer: Medicare PPO | Admitting: Family

## 2022-11-23 ENCOUNTER — Other Ambulatory Visit (INDEPENDENT_AMBULATORY_CARE_PROVIDER_SITE_OTHER): Payer: Medicare PPO

## 2022-11-23 DIAGNOSIS — R300 Dysuria: Secondary | ICD-10-CM | POA: Diagnosis not present

## 2022-11-23 DIAGNOSIS — R350 Frequency of micturition: Secondary | ICD-10-CM

## 2022-11-23 LAB — POCT URINALYSIS DIPSTICK
Bilirubin, UA: NEGATIVE
Blood, UA: NEGATIVE
Glucose, UA: NEGATIVE
Ketones, UA: POSITIVE
Nitrite, UA: POSITIVE
Protein, UA: POSITIVE — AB
Spec Grav, UA: >=1.03 — AB (ref 1.010–1.025)
Urobilinogen, UA: NEGATIVE E.U./dL — AB
pH, UA: 6 (ref 5.0–8.0)

## 2022-11-24 ENCOUNTER — Telehealth (INDEPENDENT_AMBULATORY_CARE_PROVIDER_SITE_OTHER): Payer: Medicare PPO | Admitting: Family

## 2022-11-24 ENCOUNTER — Encounter: Payer: Self-pay | Admitting: Family

## 2022-11-24 DIAGNOSIS — N3000 Acute cystitis without hematuria: Secondary | ICD-10-CM

## 2022-11-24 LAB — URINE CULTURE
MICRO NUMBER:: 14357283
SPECIMEN QUALITY:: ADEQUATE

## 2022-11-24 MED ORDER — CIPROFLOXACIN HCL 500 MG PO TABS: 500.0000 mg | ORAL_TABLET | Freq: Two times a day (BID) | ORAL | 0 refills | Status: AC

## 2022-11-24 NOTE — Progress Notes (Signed)
This service is provided via telemedicine  No vital signs collected/recorded due to the encounter was a telemedicine visit.   Location of patient (ex: home, work):  Home  Patient consents to a telephone visit:  Yes  Location of the provider (ex: office, home):  [employer].  Name of any referring provider:  Jaidee Stipe, Nelda Bucks, NP   Names of all persons participating in the telemedicine service and their role in the encounter:  Patient, Tracy Faulkner, [facility]ner, [facility], Tracy Silversmith, NP.    Time spent on call:  8 minutes spent on the phone with Medical Assistant.      Provider: Marlowe Sax FNP-C  Kyrra Prada, Nelda Bucks, NP  Patient Care Team: Rockwell Zentz, Nelda Bucks, NP as PCP - General (Family Medicine) Belva Crome, MD as PCP - Cardiology (Cardiology) Belva Crome, MD as Consulting Physician (Cardiology)  Extended Emergency Contact Information Primary Emergency Contact: [place] of [place] Phone: [phone] Mobile Phone: [phone] Relation: Daughter  Code Status:  Full Code  Goals of care: Advanced Directive information    11/24/2022    2:24 PM  Advanced Directives  Does Patient Have a Medical Advance Directive? Yes  Type of Paramedic of [place];Living will  Does patient want to make changes to medical advance directive? No - Patient declined  Copy of [place] in Chart? No - copy requested     Chief Complaint  Patient presents with   Acute Visit    Patient daughter complains of urinary frequency, more confusion, dysuria, and urine odor.     HPI:  Pt is a 88 y.o. female seen today for an acute visit for evaluation of urine frequency,dysuria,urine odor and more confused x 3 -4 days.she denies any fever,chills,nausea,vomiting,abdominal pain,flank pain,urgency,difficult urination or hematuria. Appetite is good.    Past Medical History:  Diagnosis Date   Anemia    Arthritis     Ataxia    Back problem    Bowel trouble    Breast cancer ([place])    Carpal tunnel syndrome    Constipation    Disturbance, sleep    Dyspnea    increased exertion    Frequent headaches    GI bleed    History of mammogram 2000   History of urinary tract infection    Hypertension    Obesity    Pain in both knees    Sepsis ([place])    Urinary incontinence    Past Surgical History:  Procedure Laterality Date   BREAST SURGERY     for cancer-right breast   COLON SURGERY     secondary to bowel blockages   EYE SURGERY     cataract surgery bilateral    gastric stapling     KNEE ARTHROPLASTY Left 10/07/2016   Procedure: LEFT TOTAL KNEE ARTHROPLASTY WITH COMPUTER NAVIGATION;  Surgeon: Rod Can, MD;  Location: [facility];  Service: Orthopedics;  Laterality: Left;    Allergies  Allergen Reactions   Ace Inhibitors Cough   Lortab [Hydrocodone-Acetaminophen] Other (See Comments)    Bad dreams   Remeron [Mirtazapine] Other (See Comments)    Hallucinations (can take 1/2 tablet--7.5 mg), not 15 mg   Penicillins Rash and Other (See Comments)    Throat swelling, Has patient had a PCN reaction causing immediate rash, facial/tongue/throat swelling, SOB or lightheadedness with hypotension: Yes Has patient had a PCN reaction causing severe rash involving mucus membranes or skin necrosis: No Has patient had a  PCN reaction that required hospitalization No Has patient had a PCN reaction occurring within the last 10 years: No If all of the above answers are "NO", then may proceed with Cephalosporin use.     Outpatient Encounter Medications as of 11/24/2022  Medication Sig   acetaminophen (TYLENOL) 650 MG CR tablet Take 650 mg by mouth daily as needed for pain.   bisacodyl (DULCOLAX) 5 MG EC tablet Take 1 tablet (5 mg total) by mouth daily as needed for moderate constipation.   CALCIUM PO Take 1 capsule by mouth daily.   cholecalciferol (VITAMIN D3) 10 MCG (400 UNIT) TABS tablet Take 1 tablet (400  Units total) by mouth daily.   cycloSPORINE (RESTASIS) 0.05 % ophthalmic emulsion Place 1 drop into both eyes daily.   losartan (COZAAR) 50 MG tablet Take 1 tablet (50 mg total) by mouth daily.   Multiple Vitamin (MULTIVITAMIN) tablet Take 1 tablet by mouth daily.   Omega-3 Fatty Acids (FISH OIL PO) Take 1 capsule by mouth daily.   omeprazole (PRILOSEC) 40 MG capsule TAKE 1 CAPSULE BY MOUTH ONCE DAILY BEFORE BREAKFAST   OVER THE COUNTER MEDICATION Take 1 capsule by mouth daily. Gluco Gel Joint & Cartilage Support   OVER THE COUNTER MEDICATION Apply 1 application  topically daily as needed (pain relief). Youngevity Pain Cream   polyethylene glycol (MIRALAX) 17 g packet Take 17 g by mouth daily as needed for mild constipation.   No facility-administered encounter medications on file as of 11/24/2022.    Review of Systems  Constitutional:  Negative for appetite change, chills, fatigue, fever and unexpected weight change.  Respiratory:  Negative for cough, chest tightness, shortness of breath and wheezing.   Cardiovascular:  Negative for chest pain, palpitations and leg swelling.  Gastrointestinal:  Negative for abdominal distention, abdominal pain, constipation, diarrhea, nausea and vomiting.  Genitourinary:  Positive for dysuria and frequency. Negative for difficulty urinating, flank pain, hematuria and urgency.  Musculoskeletal:  Positive for gait problem. Negative for arthralgias, back pain and myalgias.  Skin:  Negative for color change, pallor and rash.  Neurological:  Negative for dizziness, weakness, light-headedness and headaches.  Psychiatric/Behavioral:  Positive for confusion. Negative for agitation, behavioral problems, hallucinations and sleep disturbance. The patient is not nervous/anxious.     Immunization History  Administered Date(s) Administered   Fluad Quad(high Dose 65+) 09/07/2021, 09/24/2022   Influenza, High Dose Seasonal PF 08/13/2019   Influenza,inj,quad, With  Preservative 09/15/2017   Janssen (J&J) SARS-COV-2 Vaccination 02/03/2020   Pneumococcal Conjugate-13 08/18/2015   Pneumococcal Polysaccharide-23 12/13/2017   Tdap 10/11/2017   Zoster Recombinat (Shingrix) 10/11/2017, 12/13/2017   Zoster, Live 10/12/2017   Pertinent  Health Maintenance Due  Topic Date Due   INFLUENZA VACCINE  Completed   DEXA SCAN  Completed      08/02/2022    8:00 PM 08/03/2022    9:00 AM 09/02/2022    3:59 PM 09/24/2022    2:33 PM 11/24/2022    2:23 PM  Fall Risk  Falls in the past year?   '1 1 1  '$ Was there an injury with Fall?   1 1 0  Fall Risk Category Calculator   '3 3 2  '$ Fall Risk Category   High High Moderate  Patient Fall Risk Level High fall risk High fall risk High fall risk High fall risk Moderate fall risk  Patient at Risk for Falls Due to   History of fall(s);Impaired balance/gait;Impaired mobility History of fall(s);Impaired balance/gait;Impaired mobility History of fall(s)  Fall risk Follow up   Falls evaluation completed;Education provided;Falls prevention discussed Falls evaluation completed;Education provided;Falls prevention discussed Falls evaluation completed;Education provided;Falls prevention discussed   Functional Status Survey:    There were no vitals filed for this visit. There is no height or weight on file to calculate BMI. Physical Exam Constitutional:      General: She is not in acute distress.    Appearance: She is not ill-appearing.  Pulmonary:     Effort: No respiratory distress.  Neurological:     Mental Status: She is alert. Mental status is at baseline.  Psychiatric:        Mood and Affect: Mood normal.        Behavior: Behavior normal.   Daily value Labs reviewed: Recent Labs    07/31/22 0108 07/31/22 0746 08/03/22 0716  NA 141 140 143  K 4.1 4.1 4.1  CL 113* 111 106  CO2 23 20* 26  GLUCOSE 88 78 106*  BUN '8 10 17  '$ CREATININE 0.92 0.95 1.01*  CALCIUM 8.9 9.0 10.0  MG  --  1.9 2.2   Recent Labs     03/08/22 1146 07/30/22 0626  AST 26 28  ALT 19 18  ALKPHOS  --  60  BILITOT 0.7 0.9  PROT 7.0 7.0  ALBUMIN  --  3.5   Recent Labs    03/08/22 1146 07/28/22 1507 07/30/22 0626 07/31/22 0108 07/31/22 0746 08/03/22 0716  WBC 4.5 5.3 5.8 6.4 6.9 5.8  NEUTROABS 2,660 3,525 3.9  --   --   --   HGB 10.4* 10.8* 10.6* 10.0* 10.5* 10.8*  HCT 33.3* 34.3* 34.1* 30.5* 32.8* 33.5*  MCV 81.4 81.1 82.6 79.4* 80.4 80.1  PLT 144 174 177 144* 166 197   Lab Results  Component Value Date   TSH 1.00 03/08/2022   No results found for: "HGBA1C" Lab Results  Component Value Date   CHOL 195 03/08/2022   HDL 59 03/08/2022   LDLCALC 122 (H) 03/08/2022   TRIG 57 03/08/2022   CHOLHDL 3.3 03/08/2022    Significant Diagnostic Results in last 30 days:  No results found.  Assessment/Plan  Acute cystitis without hematuria Afebrile Urine dipstick indicates cloudy clear urine positive for ketones and nitrites and trace leukocytes.  Results discussed with patient and daughter would like to go ahead and start on antibiotics due to all upcoming holiday and office will be close.  Urine has been sent for urine culture. Will start on broad-spectrum antibiotics Cipro 500 mg twice daily for 7 days. Aware that might need to change antibiotics if final urine culture not sensitive to Cipro. Encouraged to increase fluid intake due to cloudy urine  Family/ staff Communication: Reviewed plan of care with patient and daughter verbalized understanding  Labs/tests ordered:  - POC Urinalysis Dipstick  - Urine Culture  Next Appointment: Return if symptoms worsen or fail to improve.  I connected with  Verdell Carmine on 11/24/22 by a video enabled telemedicine application and verified that I am speaking with the correct person using two identifiers.   I discussed the limitations of evaluation and management by telemedicine. The patient expressed understanding and agreed to proceed.   Spent 11 minutes of face to  face with patient  >50% time spent counseling; reviewing medical record; tests; labs; and developing future plan of care.   Sandrea Hughs, NP

## 2022-12-03 NOTE — Progress Notes (Signed)
This encounter was created in error - please disregard. Appoint cancelled

## 2022-12-11 ENCOUNTER — Inpatient Hospital Stay (HOSPITAL_COMMUNITY)
Admission: EM | Admit: 2022-12-11 | Discharge: 2022-12-15 | DRG: 481 | Disposition: A | Discharge status: Skilled Nursing Facility | Payer: Medicare PPO | Attending: Internal Medicine | Admitting: Internal Medicine

## 2022-12-11 ENCOUNTER — Emergency Department (HOSPITAL_COMMUNITY): Payer: Medicare PPO

## 2022-12-11 ENCOUNTER — Encounter (HOSPITAL_COMMUNITY): Payer: Self-pay

## 2022-12-11 ENCOUNTER — Other Ambulatory Visit: Payer: Self-pay

## 2022-12-11 DIAGNOSIS — Y92008 Other place in unspecified non-institutional (private) residence as the place of occurrence of the external cause: Secondary | ICD-10-CM | POA: Diagnosis not present

## 2022-12-11 DIAGNOSIS — S72401A Unspecified fracture of lower end of right femur, initial encounter for closed fracture: Secondary | ICD-10-CM | POA: Diagnosis not present

## 2022-12-11 DIAGNOSIS — R262 Difficulty in walking, not elsewhere classified: Secondary | ICD-10-CM | POA: Diagnosis not present

## 2022-12-11 DIAGNOSIS — Z4789 Encounter for other orthopedic aftercare: Secondary | ICD-10-CM | POA: Diagnosis not present

## 2022-12-11 DIAGNOSIS — S72491A Other fracture of lower end of right femur, initial encounter for closed fracture: Secondary | ICD-10-CM | POA: Diagnosis not present

## 2022-12-11 DIAGNOSIS — Z833 Family history of diabetes mellitus: Secondary | ICD-10-CM | POA: Diagnosis not present

## 2022-12-11 DIAGNOSIS — S72491S Other fracture of lower end of right femur, sequela: Secondary | ICD-10-CM | POA: Diagnosis not present

## 2022-12-11 DIAGNOSIS — Z66 Do not resuscitate: Secondary | ICD-10-CM | POA: Diagnosis present

## 2022-12-11 DIAGNOSIS — S79929A Unspecified injury of unspecified thigh, initial encounter: Secondary | ICD-10-CM | POA: Diagnosis not present

## 2022-12-11 DIAGNOSIS — M542 Cervicalgia: Secondary | ICD-10-CM | POA: Diagnosis not present

## 2022-12-11 DIAGNOSIS — N1831 Chronic kidney disease, stage 3a: Secondary | ICD-10-CM | POA: Diagnosis present

## 2022-12-11 DIAGNOSIS — D631 Anemia in chronic kidney disease: Secondary | ICD-10-CM | POA: Diagnosis present

## 2022-12-11 DIAGNOSIS — S72301A Unspecified fracture of shaft of right femur, initial encounter for closed fracture: Secondary | ICD-10-CM | POA: Diagnosis not present

## 2022-12-11 DIAGNOSIS — W19XXXA Unspecified fall, initial encounter: Secondary | ICD-10-CM

## 2022-12-11 DIAGNOSIS — R2681 Unsteadiness on feet: Secondary | ICD-10-CM | POA: Diagnosis not present

## 2022-12-11 DIAGNOSIS — S80919A Unspecified superficial injury of unspecified knee, initial encounter: Secondary | ICD-10-CM | POA: Diagnosis not present

## 2022-12-11 DIAGNOSIS — N189 Chronic kidney disease, unspecified: Secondary | ICD-10-CM | POA: Diagnosis not present

## 2022-12-11 DIAGNOSIS — E559 Vitamin D deficiency, unspecified: Secondary | ICD-10-CM | POA: Diagnosis present

## 2022-12-11 DIAGNOSIS — M545 Low back pain, unspecified: Secondary | ICD-10-CM | POA: Diagnosis not present

## 2022-12-11 DIAGNOSIS — Z96652 Presence of left artificial knee joint: Secondary | ICD-10-CM | POA: Diagnosis present

## 2022-12-11 DIAGNOSIS — D6959 Other secondary thrombocytopenia: Secondary | ICD-10-CM | POA: Diagnosis present

## 2022-12-11 DIAGNOSIS — Z803 Family history of malignant neoplasm of breast: Secondary | ICD-10-CM

## 2022-12-11 DIAGNOSIS — W010XXA Fall on same level from slipping, tripping and stumbling without subsequent striking against object, initial encounter: Secondary | ICD-10-CM | POA: Diagnosis present

## 2022-12-11 DIAGNOSIS — M7989 Other specified soft tissue disorders: Secondary | ICD-10-CM | POA: Diagnosis not present

## 2022-12-11 DIAGNOSIS — S72451A Displaced supracondylar fracture without intracondylar extension of lower end of right femur, initial encounter for closed fracture: Secondary | ICD-10-CM | POA: Diagnosis present

## 2022-12-11 DIAGNOSIS — S728X1D Other fracture of right femur, subsequent encounter for closed fracture with routine healing: Secondary | ICD-10-CM | POA: Diagnosis not present

## 2022-12-11 DIAGNOSIS — Z79899 Other long term (current) drug therapy: Secondary | ICD-10-CM

## 2022-12-11 DIAGNOSIS — S72351A Displaced comminuted fracture of shaft of right femur, initial encounter for closed fracture: Secondary | ICD-10-CM | POA: Diagnosis not present

## 2022-12-11 DIAGNOSIS — M549 Dorsalgia, unspecified: Secondary | ICD-10-CM | POA: Diagnosis not present

## 2022-12-11 DIAGNOSIS — M6281 Muscle weakness (generalized): Secondary | ICD-10-CM | POA: Diagnosis not present

## 2022-12-11 DIAGNOSIS — G3184 Mild cognitive impairment, so stated: Secondary | ICD-10-CM | POA: Diagnosis present

## 2022-12-11 DIAGNOSIS — R519 Headache, unspecified: Secondary | ICD-10-CM | POA: Diagnosis not present

## 2022-12-11 DIAGNOSIS — D62 Acute posthemorrhagic anemia: Secondary | ICD-10-CM | POA: Diagnosis not present

## 2022-12-11 DIAGNOSIS — R279 Unspecified lack of coordination: Secondary | ICD-10-CM | POA: Diagnosis not present

## 2022-12-11 DIAGNOSIS — Z8249 Family history of ischemic heart disease and other diseases of the circulatory system: Secondary | ICD-10-CM | POA: Diagnosis not present

## 2022-12-11 DIAGNOSIS — Z853 Personal history of malignant neoplasm of breast: Secondary | ICD-10-CM

## 2022-12-11 DIAGNOSIS — S7291XA Unspecified fracture of right femur, initial encounter for closed fracture: Secondary | ICD-10-CM | POA: Diagnosis present

## 2022-12-11 DIAGNOSIS — S7290XA Unspecified fracture of unspecified femur, initial encounter for closed fracture: Secondary | ICD-10-CM

## 2022-12-11 DIAGNOSIS — I10 Essential (primary) hypertension: Secondary | ICD-10-CM | POA: Diagnosis present

## 2022-12-11 DIAGNOSIS — Z888 Allergy status to other drugs, medicaments and biological substances status: Secondary | ICD-10-CM

## 2022-12-11 DIAGNOSIS — I129 Hypertensive chronic kidney disease with stage 1 through stage 4 chronic kidney disease, or unspecified chronic kidney disease: Secondary | ICD-10-CM | POA: Diagnosis present

## 2022-12-11 DIAGNOSIS — Z7401 Bed confinement status: Secondary | ICD-10-CM | POA: Diagnosis not present

## 2022-12-11 DIAGNOSIS — M6259 Muscle wasting and atrophy, not elsewhere classified, multiple sites: Secondary | ICD-10-CM | POA: Diagnosis not present

## 2022-12-11 DIAGNOSIS — S72491D Other fracture of lower end of right femur, subsequent encounter for closed fracture with routine healing: Secondary | ICD-10-CM | POA: Diagnosis not present

## 2022-12-11 DIAGNOSIS — R609 Edema, unspecified: Secondary | ICD-10-CM | POA: Diagnosis not present

## 2022-12-11 DIAGNOSIS — Y92009 Unspecified place in unspecified non-institutional (private) residence as the place of occurrence of the external cause: Secondary | ICD-10-CM | POA: Diagnosis not present

## 2022-12-11 DIAGNOSIS — D696 Thrombocytopenia, unspecified: Secondary | ICD-10-CM | POA: Diagnosis present

## 2022-12-11 DIAGNOSIS — R41841 Cognitive communication deficit: Secondary | ICD-10-CM | POA: Diagnosis not present

## 2022-12-11 DIAGNOSIS — Z880 Allergy status to penicillin: Secondary | ICD-10-CM

## 2022-12-11 DIAGNOSIS — M47812 Spondylosis without myelopathy or radiculopathy, cervical region: Secondary | ICD-10-CM | POA: Diagnosis not present

## 2022-12-11 MED ORDER — FENTANYL CITRATE PF 50 MCG/ML IJ SOSY
50.0000 ug | PREFILLED_SYRINGE | Freq: Once | INTRAMUSCULAR | Status: AC
  Administered 2022-12-12: 50 ug via INTRAVENOUS
  Filled 2022-12-11: qty 1

## 2022-12-11 NOTE — ED Triage Notes (Signed)
Pt BIB EMS for a fall. Pt endorses right knee pain after fall. No obvious deformity. Pt a&ox3.

## 2022-12-11 NOTE — ED Provider Triage Note (Signed)
Emergency Medicine Provider Triage Evaluation Note  Tracy Faulkner , a 89 y.o. female  was evaluated in triage.  Pt complains of mechanical fall today.  Patient fell approximately 2 to 3 feet off the ground after the car image on her bottom.  Daughter is at bedside who witnessed the fall.  She did not hit her head.  She did not lose any consciousness.  Daughter reports that she landed on her bottom.  She has been complaining about right knee pain.  No blood thinners..  Review of Systems  Positive:  Negative:   Physical Exam  BP (!) 150/100 (BP Location: Right Arm)   Pulse 77   Temp 97.8 F (36.6 C) (Oral)   Resp 16   SpO2 99%  Gen:   Awake, no distress   Resp:  Normal effort  MSK:   Moves extremities without difficulty  Other:  Diffuse tenderness to the right knee.  No lower leg tenderness to palpation.  She has diffuse tenderness on the femur and on the right hip.  No abdominal or chest tenderness.  She is on the lower diffuse back tenderness.  No step-offs or deformities.  She does have some tenderness to midline of her neck.   Medical Decision Making  Medically screening exam initiated at 9:20 PM.  Appropriate orders placed.  Verdell Carmine was informed that the remainder of the evaluation will be completed by another provider, this initial triage assessment does not replace that evaluation, and the importance of remaining in the ED until their evaluation is complete.  CT and XR ordered. No blood thinner use.    Sherrell Puller, PA-C 12/11/22 2129

## 2022-12-11 NOTE — ED Provider Notes (Signed)
[facility] Provider Note   CSN: [id number] Arrival date & time: 12/11/22  1948     History {Add pertinent medical, surgical, social history, OB history to HPI:1} Chief Complaint  Patient presents with   Tracy Faulkner is a 89 y.o. female.  The history is provided by the patient and medical records.  Fall   89 y.o. F with history of chronic kidney disease, anemia, hypertension, vitamin D deficiency, presenting to the ED after a fall.  Patient was attempting to get in the car today when she lost her footing and fell backwards on her butt.  She somehow twisted her right leg during the fall.  Denies any head injury or loss of consciousness.  She was not able to stand up or bear weight following the fall.  She has significant pain in the right knee, some pain in the right hip as well.  She denies any numbness of the leg.  She denies any other injuries.  She did take Tylenol prior to arrival without relief.  She is not on anticoagulation.  Ambulates with walker/cane at baseline.  Home Medications Prior to Admission medications   Medication Sig Start Date End Date Taking? Authorizing Provider  acetaminophen (TYLENOL) 650 MG CR tablet Take 650 mg by mouth daily as needed for pain.    [provider]  bisacodyl (DULCOLAX) 5 MG EC tablet Take 1 tablet (5 mg total) by mouth daily as needed for moderate constipation. 08/03/22   Amin, Tracy Flattery, MD  CALCIUM PO Take 1 capsule by mouth daily.    [provider]  cholecalciferol (VITAMIN D3) 10 MCG (400 UNIT) TABS tablet Take 1 tablet (400 Units total) by mouth daily. 08/03/22   Amin, Tracy Flattery, MD  cycloSPORINE (RESTASIS) 0.05 % ophthalmic emulsion Place 1 drop into both eyes daily. 09/24/22   Ngetich, Dinah C, NP  losartan (COZAAR) 50 MG tablet Take 1 tablet (50 mg total) by mouth daily. 09/24/22   Ngetich, Dinah C, NP  Multiple Vitamin (MULTIVITAMIN) tablet Take 1 tablet by mouth  daily.    [provider]  Omega-3 Fatty Acids (FISH OIL PO) Take 1 capsule by mouth daily.    [provider]  omeprazole (PRILOSEC) 40 MG capsule TAKE 1 CAPSULE BY MOUTH ONCE DAILY BEFORE BREAKFAST 07/28/21   Ngetich, Dinah C, NP  OVER THE COUNTER MEDICATION Take 1 capsule by mouth daily. Gluco Gel Joint & Cartilage Support    [provider]  OVER THE COUNTER MEDICATION Apply 1 application  topically daily as needed (pain relief). Youngevity Pain Cream    [provider]  polyethylene glycol (MIRALAX) 17 g packet Take 17 g by mouth daily as needed for mild constipation.    [provider]      Allergies    Ace inhibitors, Lortab [hydrocodone-acetaminophen], Remeron [mirtazapine], and Penicillins    Review of Systems   Review of Systems  Musculoskeletal:  Positive for arthralgias.  All other systems reviewed and are negative.   Physical Exam Updated Vital Signs BP (!) 150/100 (BP Location: Right Arm)   Pulse 77   Temp 97.8 F (36.6 C) (Oral)   Resp 16   SpO2 99%   Physical Exam Vitals and nursing note reviewed.  Constitutional:      Appearance: She is well-developed.  HENT:     Head: Normocephalic and atraumatic.     Comments: No visible head trauma Eyes:  Conjunctiva/sclera: Conjunctivae normal.     Pupils: Pupils are equal, round, and reactive to light.  Cardiovascular:     Rate and Rhythm: Normal rate and regular rhythm.     Heart sounds: Normal heart sounds.  Pulmonary:     Effort: Pulmonary effort is normal.     Breath sounds: Normal breath sounds.  Abdominal:     General: Bowel sounds are normal.     Palpations: Abdomen is soft.  Musculoskeletal:        General: Normal range of motion.     Cervical back: Normal range of motion.     Comments: Significant swelling just proximal to right knee along distal femur, sitting with leg externally rotated and slightly bent at the knee, DP pulse intact, normal sensation  throughout distal right leg  Skin:    General: Skin is warm and dry.  Neurological:     Mental Status: She is alert and oriented to person, place, and time.     ED Results / Procedures / Treatments   Labs (all labs ordered are listed, but only abnormal results are displayed) Labs Reviewed  URINALYSIS, ROUTINE W REFLEX MICROSCOPIC  CBC WITH DIFFERENTIAL/PLATELET  COMPREHENSIVE METABOLIC PANEL  TYPE AND SCREEN    EKG None  Radiology DG Femur Min 2 Views Right  Result Date: 12/11/2022 CLINICAL DATA:  [place], right leg tenderness EXAM: RIGHT FEMUR 2 VIEWS; RIGHT KNEE - 1-2 VIEW; DG HIP (WITH OR WITHOUT PELVIS) 2-3V RIGHT COMPARISON:  None Available. FINDINGS: Right hip: Frontal view of the pelvis as well as frontal and cross-table lateral views of the right hip are obtained. Patient is rotated to the right during imaging. There are no acute displaced fractures. The hips are well aligned. Mild symmetrical bilateral hip osteoarthritis. Calcification within the pelvis consistent with calcified fibroid. Right femur: Frontal and cross-table lateral views of the right femur are obtained. There is a comminuted impacted fracture of the distal right femoral metadiaphyseal junction. Ventral angulation at the fracture site. Overlying soft tissue swelling. Remaining portions of the right femur are unremarkable. Right knee: Frontal and cross-table lateral views are obtained. There is a comminuted impacted fracture of the distal right femoral metadiaphyseal junction, with ventral angulation at the fracture site. Fracture does not appear to extend into the femoral condyles or joint space of the right knee. There is severe osteoarthritis. Synovial osteochondromatosis within the suprapatellar region. Diffuse soft tissue swelling. IMPRESSION: 1. Comminuted impacted fracture of the distal right femoral metadiaphyseal junction. 2. Severe soft tissue swelling surrounding the distal femur and right knee. 3. No other  acute bony abnormalities. 4. 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 Electronically Signed   By: Randa Ngo M.D.   On: 12/11/2022 22:50   DG Hip Unilat W or Wo Pelvis 2-3 Views Right  Result Date: 12/11/2022 CLINICAL DATA:  [place], right leg tenderness EXAM: RIGHT FEMUR 2 VIEWS; RIGHT KNEE - 1-2 VIEW; DG HIP (WITH OR WITHOUT PELVIS) 2-3V RIGHT COMPARISON:  None Available. FINDINGS: Right hip: Frontal view of the pelvis as well as frontal and cross-table lateral views of the right hip are obtained. Patient is rotated to the right during imaging. There are no acute displaced fractures. The hips are well aligned. Mild symmetrical bilateral hip osteoarthritis. Calcification within the pelvis consistent with calcified fibroid. Right femur: Frontal and cross-table lateral views of the right femur are obtained. There is a comminuted impacted fracture of the distal right femoral metadiaphyseal junction. Ventral angulation at the fracture site.  Overlying soft tissue swelling. Remaining portions of the right femur are unremarkable. Right knee: Frontal and cross-table lateral views are obtained. There is a comminuted impacted fracture of the distal right femoral metadiaphyseal junction, with ventral angulation at the fracture site. Fracture does not appear to extend into the femoral condyles or joint space of the right knee. There is severe osteoarthritis. Synovial osteochondromatosis within the suprapatellar region. Diffuse soft tissue swelling. IMPRESSION: 1. Comminuted impacted fracture of the distal right femoral metadiaphyseal junction. 2. Severe soft tissue swelling surrounding the distal femur and right knee. 3. No other acute bony abnormalities. 4. 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 Electronically Signed   By: Randa Ngo M.D.   On: 12/11/2022 22:50   DG Knee 1-2 Views Right  Result Date: 12/11/2022 CLINICAL DATA:  [place], right leg tenderness EXAM: RIGHT FEMUR 2 VIEWS; RIGHT  KNEE - 1-2 VIEW; DG HIP (WITH OR WITHOUT PELVIS) 2-3V RIGHT COMPARISON:  None Available. FINDINGS: Right hip: Frontal view of the pelvis as well as frontal and cross-table lateral views of the right hip are obtained. Patient is rotated to the right during imaging. There are no acute displaced fractures. The hips are well aligned. Mild symmetrical bilateral hip osteoarthritis. Calcification within the pelvis consistent with calcified fibroid. Right femur: Frontal and cross-table lateral views of the right femur are obtained. There is a comminuted impacted fracture of the distal right femoral metadiaphyseal junction. Ventral angulation at the fracture site. Overlying soft tissue swelling. Remaining portions of the right femur are unremarkable. Right knee: Frontal and cross-table lateral views are obtained. There is a comminuted impacted fracture of the distal right femoral metadiaphyseal junction, with ventral angulation at the fracture site. Fracture does not appear to extend into the femoral condyles or joint space of the right knee. There is severe osteoarthritis. Synovial osteochondromatosis within the suprapatellar region. Diffuse soft tissue swelling. IMPRESSION: 1. Comminuted impacted fracture of the distal right femoral metadiaphyseal junction. 2. Severe soft tissue swelling surrounding the distal femur and right knee. 3. No other acute bony abnormalities. 4. 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 Electronically Signed   By: Randa Ngo M.D.   On: 12/11/2022 22:50   CT Cervical Spine Wo Contrast  Result Date: 12/11/2022 CLINICAL DATA:  [place], right knee pain EXAM: CT CERVICAL SPINE WITHOUT CONTRAST TECHNIQUE: Multidetector CT imaging of the cervical spine was performed without intravenous contrast. Multiplanar CT image reconstructions were also generated. RADIATION DOSE REDUCTION: This exam was performed according to the departmental dose-optimization program which includes automated  exposure control, adjustment of the mA and/or kV according to patient size and/or use of iterative reconstruction technique. COMPARISON:  None Available. FINDINGS: Alignment: Straightening of the cervical spine due to extensive spondylosis from C3 through C5. Otherwise alignment is anatomic. Skull base and vertebrae: No acute fracture. No primary bone lesion or focal pathologic process. Soft tissues and spinal canal: No prevertebral fluid or swelling. No visible canal hematoma. Disc levels: There is bony fusion across the disc spaces and facet joints from C3 through C5. Prominent spondylosis at C5-6 and C6-7. Upper chest: Airway is patent.  Lung apices are clear. Other: Reconstructed images demonstrate no additional findings. IMPRESSION: 1. No acute cervical spine fracture. 2. Extensive multilevel spondylosis and facet hypertrophy. Electronically Signed   By: Randa Ngo M.D.   On: 12/11/2022 22:20   CT Head Wo Contrast  Result Date: 12/11/2022 CLINICAL DATA:  [place], right knee pain EXAM: CT HEAD WITHOUT CONTRAST TECHNIQUE: Contiguous  axial images were obtained from the base of the skull through the vertex without intravenous contrast. RADIATION DOSE REDUCTION: This exam was performed according to the departmental dose-optimization program which includes automated exposure control, adjustment of the mA and/or kV according to patient size and/or use of iterative reconstruction technique. COMPARISON:  07/30/2022 FINDINGS: Brain: No acute infarct or hemorrhage. Lateral ventricles and midline structures are unremarkable. No acute extra-axial fluid collections. No mass effect. Vascular: No hyperdense vessel or unexpected calcification. Skull: Normal. Negative for fracture or focal lesion. Sinuses/Orbits: Mild ethmoid sinus mucosal thickening. Remaining paranasal sinuses are clear. Other: None. IMPRESSION: 1. No acute intracranial process. Electronically Signed   By: Randa Ngo M.D.   On: 12/11/2022 22:18     Procedures Procedures  {Document cardiac monitor, telemetry assessment procedure when appropriate:1}  Medications Ordered in ED Medications  fentaNYL (SUBLIMAZE) injection 50 mcg (has no administration in time range)    ED Course/ Medical Decision Making/ A&P   {   Click here for ABCD2, HEART and other calculatorsREFRESH Note before signing :1}                          Medical Decision Making Amount and/or Complexity of Data Reviewed Labs: ordered. Radiology: ordered. ECG/medicine tests: ordered.  Risk Prescription drug management. Decision regarding hospitalization.   2320-- spoke with Dr. Lyla Glassing who has reviewed images-- requested CT of knee, bucks traction vs knee immobilizer.  Keep NPO after midnight but may be done on Monday as schedule tomorrow quite full.  He will see in AM, let hospitalist know definitive OR time. Final Clinical Impression(s) / ED Diagnoses Final diagnoses:  Closed comminuted intra-articular fracture of distal end of right femur, initial encounter ([place])    Rx / DC Orders ED Discharge Orders     None

## 2022-12-12 DIAGNOSIS — S72491A Other fracture of lower end of right femur, initial encounter for closed fracture: Secondary | ICD-10-CM

## 2022-12-12 DIAGNOSIS — Z803 Family history of malignant neoplasm of breast: Secondary | ICD-10-CM | POA: Diagnosis not present

## 2022-12-12 DIAGNOSIS — D6959 Other secondary thrombocytopenia: Secondary | ICD-10-CM | POA: Diagnosis present

## 2022-12-12 DIAGNOSIS — I10 Essential (primary) hypertension: Secondary | ICD-10-CM | POA: Diagnosis not present

## 2022-12-12 DIAGNOSIS — Z853 Personal history of malignant neoplasm of breast: Secondary | ICD-10-CM | POA: Diagnosis not present

## 2022-12-12 DIAGNOSIS — E559 Vitamin D deficiency, unspecified: Secondary | ICD-10-CM | POA: Diagnosis present

## 2022-12-12 DIAGNOSIS — Z79899 Other long term (current) drug therapy: Secondary | ICD-10-CM | POA: Diagnosis not present

## 2022-12-12 DIAGNOSIS — S72451A Displaced supracondylar fracture without intracondylar extension of lower end of right femur, initial encounter for closed fracture: Secondary | ICD-10-CM | POA: Diagnosis present

## 2022-12-12 DIAGNOSIS — S728X1D Other fracture of right femur, subsequent encounter for closed fracture with routine healing: Secondary | ICD-10-CM

## 2022-12-12 DIAGNOSIS — Z96652 Presence of left artificial knee joint: Secondary | ICD-10-CM | POA: Diagnosis present

## 2022-12-12 DIAGNOSIS — Z833 Family history of diabetes mellitus: Secondary | ICD-10-CM | POA: Diagnosis not present

## 2022-12-12 DIAGNOSIS — I129 Hypertensive chronic kidney disease with stage 1 through stage 4 chronic kidney disease, or unspecified chronic kidney disease: Secondary | ICD-10-CM | POA: Diagnosis present

## 2022-12-12 DIAGNOSIS — D62 Acute posthemorrhagic anemia: Secondary | ICD-10-CM | POA: Diagnosis not present

## 2022-12-12 DIAGNOSIS — D696 Thrombocytopenia, unspecified: Secondary | ICD-10-CM | POA: Diagnosis present

## 2022-12-12 DIAGNOSIS — D631 Anemia in chronic kidney disease: Secondary | ICD-10-CM | POA: Diagnosis present

## 2022-12-12 DIAGNOSIS — Z880 Allergy status to penicillin: Secondary | ICD-10-CM | POA: Diagnosis not present

## 2022-12-12 DIAGNOSIS — Y92009 Unspecified place in unspecified non-institutional (private) residence as the place of occurrence of the external cause: Secondary | ICD-10-CM

## 2022-12-12 DIAGNOSIS — Z8249 Family history of ischemic heart disease and other diseases of the circulatory system: Secondary | ICD-10-CM | POA: Diagnosis not present

## 2022-12-12 DIAGNOSIS — W19XXXA Unspecified fall, initial encounter: Secondary | ICD-10-CM | POA: Diagnosis not present

## 2022-12-12 DIAGNOSIS — G3184 Mild cognitive impairment, so stated: Secondary | ICD-10-CM | POA: Diagnosis present

## 2022-12-12 DIAGNOSIS — Z888 Allergy status to other drugs, medicaments and biological substances status: Secondary | ICD-10-CM | POA: Diagnosis not present

## 2022-12-12 DIAGNOSIS — S72401A Unspecified fracture of lower end of right femur, initial encounter for closed fracture: Secondary | ICD-10-CM | POA: Diagnosis not present

## 2022-12-12 DIAGNOSIS — S7291XA Unspecified fracture of right femur, initial encounter for closed fracture: Secondary | ICD-10-CM | POA: Diagnosis present

## 2022-12-12 DIAGNOSIS — S7290XA Unspecified fracture of unspecified femur, initial encounter for closed fracture: Secondary | ICD-10-CM | POA: Diagnosis present

## 2022-12-12 DIAGNOSIS — N1831 Chronic kidney disease, stage 3a: Secondary | ICD-10-CM | POA: Diagnosis present

## 2022-12-12 DIAGNOSIS — W010XXA Fall on same level from slipping, tripping and stumbling without subsequent striking against object, initial encounter: Secondary | ICD-10-CM | POA: Diagnosis present

## 2022-12-12 DIAGNOSIS — Y92008 Other place in unspecified non-institutional (private) residence as the place of occurrence of the external cause: Secondary | ICD-10-CM | POA: Diagnosis not present

## 2022-12-12 DIAGNOSIS — Z66 Do not resuscitate: Secondary | ICD-10-CM | POA: Diagnosis present

## 2022-12-12 LAB — URINALYSIS, ROUTINE W REFLEX MICROSCOPIC
Bacteria, UA: NONE SEEN
Bilirubin Urine: NEGATIVE
Glucose, UA: NEGATIVE mg/dL
Hgb urine dipstick: NEGATIVE
Ketones, ur: NEGATIVE mg/dL
Leukocytes,Ua: NEGATIVE
Nitrite: NEGATIVE
Protein, ur: 30 mg/dL — AB
Specific Gravity, Urine: 1.025 (ref 1.005–1.030)
pH: 5 (ref 5.0–8.0)

## 2022-12-12 LAB — CBC WITH DIFFERENTIAL/PLATELET
Abs Immature Granulocytes: 0.03 10*3/uL (ref 0.00–0.07)
Basophils Absolute: 0 10*3/uL (ref 0.0–0.1)
Basophils Relative: 0 %
Eosinophils Absolute: 0 10*3/uL (ref 0.0–0.5)
Eosinophils Relative: 0 %
HCT: 27.1 % — ABNORMAL LOW (ref 36.0–46.0)
Hemoglobin: 8.6 g/dL — ABNORMAL LOW (ref 12.0–15.0)
Immature Granulocytes: 0 %
Lymphocytes Relative: 8 %
Lymphs Abs: 0.7 10*3/uL (ref 0.7–4.0)
MCH: 25.7 pg — ABNORMAL LOW (ref 26.0–34.0)
MCHC: 31.7 g/dL (ref 30.0–36.0)
MCV: 80.9 fL (ref 80.0–100.0)
Monocytes Absolute: 0.4 10*3/uL (ref 0.1–1.0)
Monocytes Relative: 5 %
Neutro Abs: 7.6 10*3/uL (ref 1.7–7.7)
Neutrophils Relative %: 87 %
Platelets: 141 10*3/uL — ABNORMAL LOW (ref 150–400)
RBC: 3.35 MIL/uL — ABNORMAL LOW (ref 3.87–5.11)
RDW: 15.9 % — ABNORMAL HIGH (ref 11.5–15.5)
WBC: 8.8 10*3/uL (ref 4.0–10.5)
nRBC: 0 % (ref 0.0–0.2)

## 2022-12-12 LAB — COMPREHENSIVE METABOLIC PANEL
ALT: 24 U/L (ref 0–44)
AST: 30 U/L (ref 15–41)
Albumin: 3 g/dL — ABNORMAL LOW (ref 3.5–5.0)
Alkaline Phosphatase: 55 U/L (ref 38–126)
Anion gap: 8 (ref 5–15)
BUN: 22 mg/dL (ref 8–23)
CO2: 22 mmol/L (ref 22–32)
Calcium: 9.1 mg/dL (ref 8.9–10.3)
Chloride: 115 mmol/L — ABNORMAL HIGH (ref 98–111)
Creatinine, Ser: 0.94 mg/dL (ref 0.44–1.00)
GFR, Estimated: 58 mL/min — ABNORMAL LOW (ref >60)
Glucose, Bld: 143 mg/dL — ABNORMAL HIGH (ref 70–99)
Potassium: 3.9 mmol/L (ref 3.5–5.1)
Sodium: 145 mmol/L (ref 135–145)
Total Bilirubin: 0.4 mg/dL (ref 0.3–1.2)
Total Protein: 5.8 g/dL — ABNORMAL LOW (ref 6.5–8.1)

## 2022-12-12 LAB — TSH: TSH: 0.304 u[IU]/mL — ABNORMAL LOW (ref 0.350–4.500)

## 2022-12-12 LAB — VITAMIN D 25 HYDROXY (VIT D DEFICIENCY, FRACTURES): Vit D, 25-Hydroxy: 24.93 ng/mL — ABNORMAL LOW (ref 30–100)

## 2022-12-12 LAB — PROTIME-INR
INR: 1.3 — ABNORMAL HIGH (ref 0.8–1.2)
Prothrombin Time: 16 seconds — ABNORMAL HIGH (ref 11.4–15.2)

## 2022-12-12 LAB — MAGNESIUM: Magnesium: 2 mg/dL (ref 1.7–2.4)

## 2022-12-12 MED ORDER — GUAIFENESIN 100 MG/5ML PO LIQD: 5.0000 mL | ORAL | Status: DC | PRN

## 2022-12-12 MED ORDER — METOPROLOL TARTRATE 5 MG/5ML IV SOLN: 5.0000 mg | INTRAVENOUS | Status: DC | PRN

## 2022-12-12 MED ORDER — ONDANSETRON HCL 4 MG/2ML IJ SOLN: 4.0000 mg | Freq: Four times a day (QID) | INTRAMUSCULAR | Status: DC | PRN

## 2022-12-12 MED ORDER — OMEGA-3-ACID ETHYL ESTERS 1 G PO CAPS
1.0000 g | ORAL_CAPSULE | Freq: Every day | ORAL | Status: DC
  Administered 2022-12-12 – 2022-12-15 (×2): 1 g via ORAL
  Filled 2022-12-12 (×4): qty 1

## 2022-12-12 MED ORDER — MUPIROCIN 2 % EX OINT
1.0000 | TOPICAL_OINTMENT | Freq: Two times a day (BID) | CUTANEOUS | Status: DC
  Administered 2022-12-12 – 2022-12-15 (×3): 1 via NASAL
  Filled 2022-12-12 (×2): qty 22

## 2022-12-12 MED ORDER — HEPARIN SODIUM (PORCINE) 5000 UNIT/ML IJ SOLN: 5000.0000 [IU] | Freq: Three times a day (TID) | INTRAMUSCULAR | Status: DC

## 2022-12-12 MED ORDER — VITAMIN D 25 MCG (1000 UNIT) PO TABS
1000.0000 [IU] | ORAL_TABLET | Freq: Every day | ORAL | Status: DC
  Administered 2022-12-12 – 2022-12-15 (×2): 1000 [IU] via ORAL
  Filled 2022-12-12 (×4): qty 1

## 2022-12-12 MED ORDER — DEXTROSE-NACL 5-0.45 % IV SOLN: INTRAVENOUS | Status: DC

## 2022-12-12 MED ORDER — CYCLOSPORINE 0.05 % OP EMUL
1.0000 [drp] | Freq: Every day | OPHTHALMIC | Status: DC
  Administered 2022-12-12 – 2022-12-15 (×3): 1 [drp] via OPHTHALMIC
  Filled 2022-12-12 (×4): qty 30

## 2022-12-12 MED ORDER — TRAZODONE HCL 50 MG PO TABS
50.0000 mg | ORAL_TABLET | Freq: Every evening | ORAL | Status: DC | PRN
  Administered 2022-12-12: 50 mg via ORAL
  Filled 2022-12-12: qty 1

## 2022-12-12 MED ORDER — HYDRALAZINE HCL 20 MG/ML IJ SOLN: 5.0000 mg | INTRAMUSCULAR | Status: DC | PRN

## 2022-12-12 MED ORDER — SENNOSIDES-DOCUSATE SODIUM 8.6-50 MG PO TABS: 1.0000 | ORAL_TABLET | Freq: Every evening | ORAL | Status: DC | PRN

## 2022-12-12 MED ORDER — HYDROMORPHONE HCL 1 MG/ML IJ SOLN
0.5000 mg | INTRAMUSCULAR | Status: DC | PRN
  Administered 2022-12-12: 0.5 mg via INTRAVENOUS
  Filled 2022-12-12: qty 0.5

## 2022-12-12 MED ORDER — ADULT MULTIVITAMIN W/MINERALS CH
1.0000 | ORAL_TABLET | Freq: Every day | ORAL | Status: DC
  Administered 2022-12-12 – 2022-12-15 (×2): 1 via ORAL
  Filled 2022-12-12 (×4): qty 1

## 2022-12-12 MED ORDER — PANTOPRAZOLE SODIUM 40 MG PO TBEC
40.0000 mg | DELAYED_RELEASE_TABLET | Freq: Every day | ORAL | Status: DC
  Administered 2022-12-12 – 2022-12-15 (×3): 40 mg via ORAL
  Filled 2022-12-12 (×4): qty 1

## 2022-12-12 MED ORDER — ENOXAPARIN SODIUM 30 MG/0.3ML IJ SOSY
30.0000 mg | PREFILLED_SYRINGE | Freq: Once | INTRAMUSCULAR | Status: AC
  Administered 2022-12-12: 30 mg via SUBCUTANEOUS
  Filled 2022-12-12: qty 0.3

## 2022-12-12 MED ORDER — IPRATROPIUM-ALBUTEROL 0.5-2.5 (3) MG/3ML IN SOLN: 3.0000 mL | RESPIRATORY_TRACT | Status: DC | PRN

## 2022-12-12 MED ORDER — LOSARTAN POTASSIUM 50 MG PO TABS
50.0000 mg | ORAL_TABLET | Freq: Every day | ORAL | Status: DC
  Administered 2022-12-12 – 2022-12-13 (×2): 50 mg via ORAL
  Filled 2022-12-12 (×2): qty 1

## 2022-12-12 MED ORDER — OXYCODONE HCL 5 MG PO TABS
5.0000 mg | ORAL_TABLET | ORAL | Status: DC | PRN
  Administered 2022-12-13 – 2022-12-15 (×4): 5 mg via ORAL
  Filled 2022-12-12 (×4): qty 1

## 2022-12-12 MED ORDER — LACTATED RINGERS IV SOLN: INTRAVENOUS | Status: DC

## 2022-12-12 MED ORDER — ACETAMINOPHEN 325 MG PO TABS
650.0000 mg | ORAL_TABLET | Freq: Four times a day (QID) | ORAL | Status: DC | PRN
  Administered 2022-12-12: 650 mg via ORAL
  Filled 2022-12-12: qty 2

## 2022-12-12 MED ORDER — ONDANSETRON HCL 4 MG PO TABS: 4.0000 mg | ORAL_TABLET | Freq: Four times a day (QID) | ORAL | Status: DC | PRN

## 2022-12-12 MED ORDER — HYDRALAZINE HCL 20 MG/ML IJ SOLN: 10.0000 mg | INTRAMUSCULAR | Status: DC | PRN

## 2022-12-12 MED ORDER — ACETAMINOPHEN 650 MG RE SUPP: 650.0000 mg | Freq: Four times a day (QID) | RECTAL | Status: DC | PRN

## 2022-12-12 NOTE — H&P (Signed)
PCP:   Ngetich, Nelda Bucks, NP   Chief Complaint: Right hip pain   HPI: This is a 89 year old female with past medical history significant for hypertension and remote history of breast cancer.  She currently developing mild cognitive impairment.  Per family have noticed her getting a bit weaker and becoming increased fall risk.  Today they were getting ready to go out, when she fell trying to get in the car.  It was a witnessed fall.  There was no loss of consciousness, she did not hit her head.  She was in a lot of pain therefore an ambulance was called, she was taken to the ER.   In the ER she was noted to have sustained comminuted diametaphyseal distal femoral fracture with impaction at the fracture site, displacement and angulation   History provided by daughter who is at bedside.  Review of Systems:  The patient denies anorexia, fever, weight loss,, vision loss, decreased hearing, hoarseness, chest pain, syncope, dyspnea on exertion, peripheral edema, balance deficits, hemoptysis, abdominal pain, melena, hematochezia, severe indigestion/heartburn, hematuria, incontinence, genital sores, muscle weakness, suspicious skin lesions, transient blindness, difficulty walking, depression, unusual weight change, abnormal bleeding, enlarged lymph nodes, angioedema, and breast masses.  Past Medical History: Past Medical History:  Diagnosis Date   Anemia    Arthritis    Ataxia    Back problem    Bowel trouble    Breast cancer ([place])    Carpal tunnel syndrome    Constipation    Disturbance, sleep    Dyspnea    increased exertion    Frequent headaches    GI bleed    History of mammogram 2000   History of urinary tract infection    Hypertension    Obesity    Pain in both knees    Sepsis ([place])    Urinary incontinence    Past Surgical History:  Procedure Laterality Date   BREAST SURGERY     for cancer-right breast   COLON SURGERY     secondary to bowel blockages   EYE SURGERY      cataract surgery bilateral    gastric stapling     KNEE ARTHROPLASTY Left 10/07/2016   Procedure: LEFT TOTAL KNEE ARTHROPLASTY WITH COMPUTER NAVIGATION;  Surgeon: Rod Can, MD;  Location: [facility];  Service: Orthopedics;  Laterality: Left;    Medications: Prior to Admission medications   Medication Sig Start Date End Date Taking? Authorizing Provider  acetaminophen (TYLENOL) 650 MG CR tablet Take 650 mg by mouth daily as needed for pain.    [provider]  bisacodyl (DULCOLAX) 5 MG EC tablet Take 1 tablet (5 mg total) by mouth daily as needed for moderate constipation. 08/03/22   Amin, Jeanella Flattery, MD  CALCIUM PO Take 1 capsule by mouth daily.    [provider]  cholecalciferol (VITAMIN D3) 10 MCG (400 UNIT) TABS tablet Take 1 tablet (400 Units total) by mouth daily. 08/03/22   Amin, Jeanella Flattery, MD  cycloSPORINE (RESTASIS) 0.05 % ophthalmic emulsion Place 1 drop into both eyes daily. 09/24/22   Ngetich, Dinah C, NP  losartan (COZAAR) 50 MG tablet Take 1 tablet (50 mg total) by mouth daily. 09/24/22   Ngetich, Dinah C, NP  Multiple Vitamin (MULTIVITAMIN) tablet Take 1 tablet by mouth daily.    [provider]  Omega-3 Fatty Acids (FISH OIL PO) Take 1 capsule by mouth daily.    [provider]  omeprazole (PRILOSEC) 40 MG capsule TAKE 1 CAPSULE  BY MOUTH ONCE DAILY BEFORE BREAKFAST 07/28/21   Ngetich, Dinah C, NP  OVER THE COUNTER MEDICATION Take 1 capsule by mouth daily. Gluco Gel Joint & Cartilage Support    [provider]  OVER THE COUNTER MEDICATION Apply 1 application  topically daily as needed (pain relief). Youngevity Pain Cream    [provider]  polyethylene glycol (MIRALAX) 17 g packet Take 17 g by mouth daily as needed for mild constipation.    [provider]    Allergies:   Allergies  Allergen Reactions   Ace Inhibitors Cough   Lortab [Hydrocodone-Acetaminophen] Other (See Comments)    Bad dreams   Remeron  [Mirtazapine] Other (See Comments)    Hallucinations (can take 1/2 tablet--7.5 mg), not 15 mg   Penicillins Rash and Other (See Comments)    Throat swelling, Has patient had a PCN reaction causing immediate rash, facial/tongue/throat swelling, SOB or lightheadedness with hypotension: Yes Has patient had a PCN reaction causing severe rash involving mucus membranes or skin necrosis: No Has patient had a PCN reaction that required hospitalization No Has patient had a PCN reaction occurring within the last 10 years: No If all of the above answers are "NO", then may proceed with Cephalosporin use.     Social History:  reports that she has never smoked. She has never used smokeless tobacco. She reports that she does not drink alcohol and does not use drugs.  Family History: Family History  Problem Relation Age of Onset   Arthritis Mother    Diabetes Mellitus II Mother    Hypertension Mother    Breast cancer Mother    Heart disease Father     Physical Exam: Vitals:   12/11/22 2034  BP: (!) 150/100  Pulse: 77  Resp: 16  Temp: 97.8 F (36.6 C)  TempSrc: Oral  SpO2: 99%    General:  Alert and oriented times three, well developed and nourished, no acute distress Eyes: PERRLA, pink conjunctiva, no scleral icterus ENT: Moist oral mucosa, neck supple, no thyromegaly Lungs: clear to ascultation, no wheeze, no crackles, no use of accessory muscles Cardiovascular: regular rate and rhythm, no regurgitation, no gallops, no murmurs. No carotid bruits, no JVD Abdomen: soft, positive BS, non-tender, non-distended, no organomegaly, not an acute abdomen GU: not examined Neuro: CN II - XII grossly intact, sensation intact Musculoskeletal: Right hip pain Skin: no rash, no subcutaneous crepitation, no decubitus Psych: appropriate patient   Labs on Admission:   Recent Labs    12/12/22 0207  WBC 8.8  NEUTROABS 7.6  HGB 8.6*  HCT 27.1*  MCV 80.9  PLT 141*    Radiological Exams on  Admission: CT Knee Right Wo Contrast  Result Date: 12/12/2022 CLINICAL DATA:  Distal right femoral fracture with comminution. EXAM: CT OF THE RIGHT KNEE WITHOUT CONTRAST TECHNIQUE: Multidetector CT imaging of the right knee was performed according to the standard protocol. Multiplanar CT image reconstructions were also generated. RADIATION DOSE REDUCTION: This exam was performed according to the departmental dose-optimization program which includes automated exposure control, adjustment of the mA and/or kV according to patient size and/or use of iterative reconstruction technique. COMPARISON:  Right knee and femoral series earlier today. FINDINGS: Bones/Joint/Cartilage Diffuse osteopenia. Again noted is a comminuted diametaphyseal distal femoral fracture with impaction at the fracture site. There is a butterfly comminution fragment of the medial aspect of the distal fragment of the distal femoral metaphysis, slightly displaced and additional smaller comminution fragments between fracture margins. The main distal  fragment is displaced posteriorly up to 1/2 of a shaft width, maybe only approximately 1/3 of the shaft width, with mild angulation towards the medial aspect and moderate dorsal angulation of the distal fragment. The fracture does not extend into the condyles. There has no proximal tibial or fibular fracture. Background advanced tricompartmental degenerative arthrosis with bulky marginal osteophytes and with juxta-articular sclerosis and microcystic change along the femorotibial articulation. There is mild spurring of the proximal tibiofibular joint. There is a moderate heterogeneous hemarthrosis, most of which is suprapatellar. There is a 2.9 cm popcorn like calcification in the lateral gutter of the suprapatellar bursal space, additional small subcentimeter loose bodies medially, findings consistent with synovial osteochondromatosis. Ligaments Suboptimally assessed by CT. Muscles and Tendons Not well  seen with this technique. The extensor mechanism is grossly intact. Partial fatty atrophy in the dorsal thigh compartment is noted, mild atrophy in the foreleg musculature. No visible intramuscular hematoma or soft tissue gas. Soft tissues Diffuse superficial edema. No soft tissue gas is seen. No soft tissue mass. Other: Partially visible left knee arthroplasty. IMPRESSION: 1. Comminuted diametaphyseal distal femoral fracture with impaction at the fracture site, displacement and angulation as described above. 2. Moderate heterogeneous hemarthrosis. 3. Osteopenia and degenerative change. 4. Diffuse superficial edema. 5. 2.9 cm popcorn like calcification in the lateral gutter of the suprapatellar bursal space, additional small subcentimeter loose bodies medially, findings consistent with synovial osteochondromatosis. Electronically Signed   By: Telford Nab M.D.   On: 12/12/2022 00:15   DG Femur Min 2 Views Right  Result Date: 12/11/2022 CLINICAL DATA:  [place], right leg tenderness EXAM: RIGHT FEMUR 2 VIEWS; RIGHT KNEE - 1-2 VIEW; DG HIP (WITH OR WITHOUT PELVIS) 2-3V RIGHT COMPARISON:  None Available. FINDINGS: Right hip: Frontal view of the pelvis as well as frontal and cross-table lateral views of the right hip are obtained. Patient is rotated to the right during imaging. There are no acute displaced fractures. The hips are well aligned. Mild symmetrical bilateral hip osteoarthritis. Calcification within the pelvis consistent with calcified fibroid. Right femur: Frontal and cross-table lateral views of the right femur are obtained. There is a comminuted impacted fracture of the distal right femoral metadiaphyseal junction. Ventral angulation at the fracture site. Overlying soft tissue swelling. Remaining portions of the right femur are unremarkable. Right knee: Frontal and cross-table lateral views are obtained. There is a comminuted impacted fracture of the distal right femoral metadiaphyseal junction, with  ventral angulation at the fracture site. Fracture does not appear to extend into the femoral condyles or joint space of the right knee. There is severe osteoarthritis. Synovial osteochondromatosis within the suprapatellar region. Diffuse soft tissue swelling. IMPRESSION: 1. Comminuted impacted fracture of the distal right femoral metadiaphyseal junction. 2. Severe soft tissue swelling surrounding the distal femur and right knee. 3. No other acute bony abnormalities. 4. 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 Electronically Signed   By: Randa Ngo M.D.   On: 12/11/2022 22:50   DG Hip Unilat W or Wo Pelvis 2-3 Views Right  Result Date: 12/11/2022 CLINICAL DATA:  [place], right leg tenderness EXAM: RIGHT FEMUR 2 VIEWS; RIGHT KNEE - 1-2 VIEW; DG HIP (WITH OR WITHOUT PELVIS) 2-3V RIGHT COMPARISON:  None Available. FINDINGS: Right hip: Frontal view of the pelvis as well as frontal and cross-table lateral views of the right hip are obtained. Patient is rotated to the right during imaging. There are no acute displaced fractures. The hips are well aligned. Mild symmetrical bilateral hip osteoarthritis.  Calcification within the pelvis consistent with calcified fibroid. Right femur: Frontal and cross-table lateral views of the right femur are obtained. There is a comminuted impacted fracture of the distal right femoral metadiaphyseal junction. Ventral angulation at the fracture site. Overlying soft tissue swelling. Remaining portions of the right femur are unremarkable. Right knee: Frontal and cross-table lateral views are obtained. There is a comminuted impacted fracture of the distal right femoral metadiaphyseal junction, with ventral angulation at the fracture site. Fracture does not appear to extend into the femoral condyles or joint space of the right knee. There is severe osteoarthritis. Synovial osteochondromatosis within the suprapatellar region. Diffuse soft tissue swelling. IMPRESSION: 1. Comminuted  impacted fracture of the distal right femoral metadiaphyseal junction. 2. Severe soft tissue swelling surrounding the distal femur and right knee. 3. No other acute bony abnormalities. 4. 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 Electronically Signed   By: Randa Ngo M.D.   On: 12/11/2022 22:50   DG Knee 1-2 Views Right  Result Date: 12/11/2022 CLINICAL DATA:  [place], right leg tenderness EXAM: RIGHT FEMUR 2 VIEWS; RIGHT KNEE - 1-2 VIEW; DG HIP (WITH OR WITHOUT PELVIS) 2-3V RIGHT COMPARISON:  None Available. FINDINGS: Right hip: Frontal view of the pelvis as well as frontal and cross-table lateral views of the right hip are obtained. Patient is rotated to the right during imaging. There are no acute displaced fractures. The hips are well aligned. Mild symmetrical bilateral hip osteoarthritis. Calcification within the pelvis consistent with calcified fibroid. Right femur: Frontal and cross-table lateral views of the right femur are obtained. There is a comminuted impacted fracture of the distal right femoral metadiaphyseal junction. Ventral angulation at the fracture site. Overlying soft tissue swelling. Remaining portions of the right femur are unremarkable. Right knee: Frontal and cross-table lateral views are obtained. There is a comminuted impacted fracture of the distal right femoral metadiaphyseal junction, with ventral angulation at the fracture site. Fracture does not appear to extend into the femoral condyles or joint space of the right knee. There is severe osteoarthritis. Synovial osteochondromatosis within the suprapatellar region. Diffuse soft tissue swelling. IMPRESSION: 1. Comminuted impacted fracture of the distal right femoral metadiaphyseal junction. 2. Severe soft tissue swelling surrounding the distal femur and right knee. 3. No other acute bony abnormalities. 4. 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 Electronically Signed   By: Randa Ngo M.D.   On: 12/11/2022  22:50   CT Cervical Spine Wo Contrast  Result Date: 12/11/2022 CLINICAL DATA:  [place], right knee pain EXAM: CT CERVICAL SPINE WITHOUT CONTRAST TECHNIQUE: Multidetector CT imaging of the cervical spine was performed without intravenous contrast. Multiplanar CT image reconstructions were also generated. RADIATION DOSE REDUCTION: This exam was performed according to the departmental dose-optimization program which includes automated exposure control, adjustment of the mA and/or kV according to patient size and/or use of iterative reconstruction technique. COMPARISON:  None Available. FINDINGS: Alignment: Straightening of the cervical spine due to extensive spondylosis from C3 through C5. Otherwise alignment is anatomic. Skull base and vertebrae: No acute fracture. No primary bone lesion or focal pathologic process. Soft tissues and spinal canal: No prevertebral fluid or swelling. No visible canal hematoma. Disc levels: There is bony fusion across the disc spaces and facet joints from C3 through C5. Prominent spondylosis at C5-6 and C6-7. Upper chest: Airway is patent.  Lung apices are clear. Other: Reconstructed images demonstrate no additional findings. IMPRESSION: 1. No acute cervical spine fracture. 2. Extensive multilevel spondylosis and  facet hypertrophy. Electronically Signed   By: Randa Ngo M.D.   On: 12/11/2022 22:20   CT Head Wo Contrast  Result Date: 12/11/2022 CLINICAL DATA:  [place], right knee pain EXAM: CT HEAD WITHOUT CONTRAST TECHNIQUE: Contiguous axial images were obtained from the base of the skull through the vertex without intravenous contrast. RADIATION DOSE REDUCTION: This exam was performed according to the departmental dose-optimization program which includes automated exposure control, adjustment of the mA and/or kV according to patient size and/or use of iterative reconstruction technique. COMPARISON:  07/30/2022 FINDINGS: Brain: No acute infarct or hemorrhage. Lateral ventricles and  midline structures are unremarkable. No acute extra-axial fluid collections. No mass effect. Vascular: No hyperdense vessel or unexpected calcification. Skull: Normal. Negative for fracture or focal lesion. Sinuses/Orbits: Mild ethmoid sinus mucosal thickening. Remaining paranasal sinuses are clear. Other: None. IMPRESSION: 1. No acute intracranial process. Electronically Signed   By: Randa Ngo M.D.   On: 12/11/2022 22:18    Assessment/Plan Present on Admission:  Right femoral fracture (HCC) -Admit to MedSurg -N.p.o., gentle IV fluid hydration -Pain meds as needed -Orthopedics on-call have been consulted.  His schedule is full, if he is unable to do the surgery, he will try to engage in other orthopedic surgeon.  If that is unsuccessful he will put on the schedule for tomorrow.   Thrombocytopenia ([place]), mild -Monitor   Anemia due to chronic kidney disease -Hemoglobin 8.6, down from 10.8 08/03/2022 -Anemia panel ordered   MCI (mild cognitive impairment) -Aware   Hypertension -Cozaar resumed   DNR (do not resuscitate)  Tracy Faulkner 12/12/2022, 3:03 AM

## 2022-12-12 NOTE — Progress Notes (Addendum)
PROGRESS NOTE    Tracy Faulkner  KXF:[id number] DOB: [birth year] DOA: 12/11/2022 PCP: Sandrea Hughs, NP   Brief Narrative:  89 year old with history of HTN, remote breast cancer comes to the hospital with complaints of weakness and fall when trying to get out of car.  Patient was found to have communicated diametaphyseal distal femoral fracture with impaction at the femoral site.  Orthopedic consulted   Assessment & Plan:  Principal Problem:   Femoral fracture ([place]) Active Problems:   Hypertension   Hx of breast cancer   MCI (mild cognitive impairment)   Chronic kidney disease, stage 3a (H[facility])   DNR (do not resuscitate)   Fall at home, initial encounter   Thrombocytopenia ([place])   Anemia due to chronic kidney disease   Femur fracture, right ([place])    Right femoral fracture, close Mechanical fall - Orthopedic planning on surgery tomorrow.  PT/OT.  Postop pain control, dressing changes, follow-up care, PT/OT precautions and DVT prophylaxis management per orthopedic. -CT head and cervical spine are negative  Thrombocytopenia - Platelets stable will monitor  Anemia of chronic disease - Hemoglobin stable at 8.6, will monitor  Essential hypertension - Losartan 50 mg daily.  IV as needed  Mild cognitive impairment - Supportive care  Vitamin D deficiency - Supplement started    DVT prophylaxis: Subcu heparin Code Status: DNR Family Communication: Daughter at bedside  Status is: Inpatient Surgical plans tomorrow   Subjective: Doing well, daughter at bedside.  Complains of pain at the surgical site of the right hip.  Otherwise doing okay.   Examination:  General exam: Appears calm and comfortable  Respiratory system: Clear to auscultation. Respiratory effort normal. Cardiovascular system: S1 & S2 heard, RRR. No JVD, murmurs, rubs, gallops or clicks. No pedal edema. Gastrointestinal system: Abdomen is nondistended, soft and nontender. No organomegaly or masses  felt. Normal bowel sounds heard. Central nervous system: Alert and oriented. No focal neurological deficits. Extremities: Limited engine motion of bilateral lower extremities Skin: No rashes, lesions or ulcers Psychiatry: Judgement and insight appear normal. Mood & affect appropriate.     Objective: Vitals:   12/11/22 2034 12/12/22 0504  BP: (!) 150/100 (!) 136/96  Pulse: 77 79  Resp: 16 17  Temp: 97.8 F (36.6 C) 98.5 F (36.9 C)  TempSrc: Oral Oral  SpO2: 99% 100%    Intake/Output Summary (Last 24 hours) at 12/12/2022 6967 Last data filed at 12/12/2022 8938 Gross per 24 hour  Intake 201.16 ml  Output --  Net 201.16 ml   There were no vitals filed for this visit.   Data Reviewed:   CBC: Recent Labs  Lab 12/12/22 0207  WBC 8.8  NEUTROABS 7.6  HGB 8.6*  HCT 27.1*  MCV 80.9  PLT 101*   Basic Metabolic Panel: Recent Labs  Lab 12/12/22 0207 12/12/22 0552  NA 145  --   K 3.9  --   CL 115*  --   CO2 22  --   GLUCOSE 143*  --   BUN 22  --   CREATININE 0.94  --   CALCIUM 9.1  --   MG  --  2.0   GFR: CrCl cannot be calculated (Unknown ideal weight.). Liver Function Tests: Recent Labs  Lab 12/12/22 0207  AST 30  ALT 24  ALKPHOS 55  BILITOT 0.4  PROT 5.8*  ALBUMIN 3.0*   No results for input(s): "LIPASE", "AMYLASE" in the last 168 hours. No results for input(s): "AMMONIA" in the last 168  hours. Coagulation Profile: Recent Labs  Lab 12/12/22 0552  INR 1.3*   Cardiac Enzymes: No results for input(s): "CKTOTAL", "CKMB", "CKMBINDEX", "TROPONINI" in the last 168 hours. BNP (last 3 results) No results for input(s): "PROBNP" in the last 8760 hours. HbA1C: No results for input(s): "HGBA1C" in the last 72 hours. CBG: No results for input(s): "GLUCAP" in the last 168 hours. Lipid Profile: No results for input(s): "CHOL", "HDL", "LDLCALC", "TRIG", "CHOLHDL", "LDLDIRECT" in the last 72 hours. Thyroid Function Tests: No results for input(s): "TSH",  "T4TOTAL", "FREET4", "T3FREE", "THYROIDAB" in the last 72 hours. Anemia Panel: No results for input(s): "VITAMINB12", "FOLATE", "FERRITIN", "TIBC", "IRON", "RETICCTPCT" in the last 72 hours. Sepsis Labs: No results for input(s): "PROCALCITON", "LATICACIDVEN" in the last 168 hours.  No results found for this or any previous visit (from the past 240 hour(s)).       Radiology Studies: CT Knee Right Wo Contrast  Result Date: 12/12/2022 CLINICAL DATA:  Distal right femoral fracture with comminution. EXAM: CT OF THE RIGHT KNEE WITHOUT CONTRAST TECHNIQUE: Multidetector CT imaging of the right knee was performed according to the standard protocol. Multiplanar CT image reconstructions were also generated. RADIATION DOSE REDUCTION: This exam was performed according to the departmental dose-optimization program which includes automated exposure control, adjustment of the mA and/or kV according to patient size and/or use of iterative reconstruction technique. COMPARISON:  Right knee and femoral series earlier today. FINDINGS: Bones/Joint/Cartilage Diffuse osteopenia. Again noted is a comminuted diametaphyseal distal femoral fracture with impaction at the fracture site. There is a butterfly comminution fragment of the medial aspect of the distal fragment of the distal femoral metaphysis, slightly displaced and additional smaller comminution fragments between fracture margins. The main distal fragment is displaced posteriorly up to 1/2 of a shaft width, maybe only approximately 1/3 of the shaft width, with mild angulation towards the medial aspect and moderate dorsal angulation of the distal fragment. The fracture does not extend into the condyles. There has no proximal tibial or fibular fracture. Background advanced tricompartmental degenerative arthrosis with bulky marginal osteophytes and with juxta-articular sclerosis and microcystic change along the femorotibial articulation. There is mild spurring of the  proximal tibiofibular joint. There is a moderate heterogeneous hemarthrosis, most of which is suprapatellar. There is a 2.9 cm popcorn like calcification in the lateral gutter of the suprapatellar bursal space, additional small subcentimeter loose bodies medially, findings consistent with synovial osteochondromatosis. Ligaments Suboptimally assessed by CT. Muscles and Tendons Not well seen with this technique. The extensor mechanism is grossly intact. Partial fatty atrophy in the dorsal thigh compartment is noted, mild atrophy in the foreleg musculature. No visible intramuscular hematoma or soft tissue gas. Soft tissues Diffuse superficial edema. No soft tissue gas is seen. No soft tissue mass. Other: Partially visible left knee arthroplasty. IMPRESSION: 1. Comminuted diametaphyseal distal femoral fracture with impaction at the fracture site, displacement and angulation as described above. 2. Moderate heterogeneous hemarthrosis. 3. Osteopenia and degenerative change. 4. Diffuse superficial edema. 5. 2.9 cm popcorn like calcification in the lateral gutter of the suprapatellar bursal space, additional small subcentimeter loose bodies medially, findings consistent with synovial osteochondromatosis. Electronically Signed   By: Telford Nab M.D.   On: 12/12/2022 00:15   DG Femur Min 2 Views Right  Result Date: 12/11/2022 CLINICAL DATA:  [place], right leg tenderness EXAM: RIGHT FEMUR 2 VIEWS; RIGHT KNEE - 1-2 VIEW; DG HIP (WITH OR WITHOUT PELVIS) 2-3V RIGHT COMPARISON:  None Available. FINDINGS: Right hip: Frontal view of the pelvis  as well as frontal and cross-table lateral views of the right hip are obtained. Patient is rotated to the right during imaging. There are no acute displaced fractures. The hips are well aligned. Mild symmetrical bilateral hip osteoarthritis. Calcification within the pelvis consistent with calcified fibroid. Right femur: Frontal and cross-table lateral views of the right femur are obtained.  There is a comminuted impacted fracture of the distal right femoral metadiaphyseal junction. Ventral angulation at the fracture site. Overlying soft tissue swelling. Remaining portions of the right femur are unremarkable. Right knee: Frontal and cross-table lateral views are obtained. There is a comminuted impacted fracture of the distal right femoral metadiaphyseal junction, with ventral angulation at the fracture site. Fracture does not appear to extend into the femoral condyles or joint space of the right knee. There is severe osteoarthritis. Synovial osteochondromatosis within the suprapatellar region. Diffuse soft tissue swelling. IMPRESSION: 1. Comminuted impacted fracture of the distal right femoral metadiaphyseal junction. 2. Severe soft tissue swelling surrounding the distal femur and right knee. 3. No other acute bony abnormalities. 4. 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 Electronically Signed   By: Randa Ngo M.D.   On: 12/11/2022 22:50   DG Hip Unilat W or Wo Pelvis 2-3 Views Right  Result Date: 12/11/2022 CLINICAL DATA:  [place], right leg tenderness EXAM: RIGHT FEMUR 2 VIEWS; RIGHT KNEE - 1-2 VIEW; DG HIP (WITH OR WITHOUT PELVIS) 2-3V RIGHT COMPARISON:  None Available. FINDINGS: Right hip: Frontal view of the pelvis as well as frontal and cross-table lateral views of the right hip are obtained. Patient is rotated to the right during imaging. There are no acute displaced fractures. The hips are well aligned. Mild symmetrical bilateral hip osteoarthritis. Calcification within the pelvis consistent with calcified fibroid. Right femur: Frontal and cross-table lateral views of the right femur are obtained. There is a comminuted impacted fracture of the distal right femoral metadiaphyseal junction. Ventral angulation at the fracture site. Overlying soft tissue swelling. Remaining portions of the right femur are unremarkable. Right knee: Frontal and cross-table lateral views are obtained.  There is a comminuted impacted fracture of the distal right femoral metadiaphyseal junction, with ventral angulation at the fracture site. Fracture does not appear to extend into the femoral condyles or joint space of the right knee. There is severe osteoarthritis. Synovial osteochondromatosis within the suprapatellar region. Diffuse soft tissue swelling. IMPRESSION: 1. Comminuted impacted fracture of the distal right femoral metadiaphyseal junction. 2. Severe soft tissue swelling surrounding the distal femur and right knee. 3. No other acute bony abnormalities. 4. 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 Electronically Signed   By: Randa Ngo M.D.   On: 12/11/2022 22:50   DG Knee 1-2 Views Right  Result Date: 12/11/2022 CLINICAL DATA:  [place], right leg tenderness EXAM: RIGHT FEMUR 2 VIEWS; RIGHT KNEE - 1-2 VIEW; DG HIP (WITH OR WITHOUT PELVIS) 2-3V RIGHT COMPARISON:  None Available. FINDINGS: Right hip: Frontal view of the pelvis as well as frontal and cross-table lateral views of the right hip are obtained. Patient is rotated to the right during imaging. There are no acute displaced fractures. The hips are well aligned. Mild symmetrical bilateral hip osteoarthritis. Calcification within the pelvis consistent with calcified fibroid. Right femur: Frontal and cross-table lateral views of the right femur are obtained. There is a comminuted impacted fracture of the distal right femoral metadiaphyseal junction. Ventral angulation at the fracture site. Overlying soft tissue swelling. Remaining portions of the right femur are unremarkable. Right  knee: Frontal and cross-table lateral views are obtained. There is a comminuted impacted fracture of the distal right femoral metadiaphyseal junction, with ventral angulation at the fracture site. Fracture does not appear to extend into the femoral condyles or joint space of the right knee. There is severe osteoarthritis. Synovial osteochondromatosis within the  suprapatellar region. Diffuse soft tissue swelling. IMPRESSION: 1. Comminuted impacted fracture of the distal right femoral metadiaphyseal junction. 2. Severe soft tissue swelling surrounding the distal femur and right knee. 3. No other acute bony abnormalities. 4. 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 Electronically Signed   By: Randa Ngo M.D.   On: 12/11/2022 22:50   CT Cervical Spine Wo Contrast  Result Date: 12/11/2022 CLINICAL DATA:  [place], right knee pain EXAM: CT CERVICAL SPINE WITHOUT CONTRAST TECHNIQUE: Multidetector CT imaging of the cervical spine was performed without intravenous contrast. Multiplanar CT image reconstructions were also generated. RADIATION DOSE REDUCTION: This exam was performed according to the departmental dose-optimization program which includes automated exposure control, adjustment of the mA and/or kV according to patient size and/or use of iterative reconstruction technique. COMPARISON:  None Available. FINDINGS: Alignment: Straightening of the cervical spine due to extensive spondylosis from C3 through C5. Otherwise alignment is anatomic. Skull base and vertebrae: No acute fracture. No primary bone lesion or focal pathologic process. Soft tissues and spinal canal: No prevertebral fluid or swelling. No visible canal hematoma. Disc levels: There is bony fusion across the disc spaces and facet joints from C3 through C5. Prominent spondylosis at C5-6 and C6-7. Upper chest: Airway is patent.  Lung apices are clear. Other: Reconstructed images demonstrate no additional findings. IMPRESSION: 1. No acute cervical spine fracture. 2. Extensive multilevel spondylosis and facet hypertrophy. Electronically Signed   By: Randa Ngo M.D.   On: 12/11/2022 22:20   CT Head Wo Contrast  Result Date: 12/11/2022 CLINICAL DATA:  [place], right knee pain EXAM: CT HEAD WITHOUT CONTRAST TECHNIQUE: Contiguous axial images were obtained from the base of the skull through the  vertex without intravenous contrast. RADIATION DOSE REDUCTION: This exam was performed according to the departmental dose-optimization program which includes automated exposure control, adjustment of the mA and/or kV according to patient size and/or use of iterative reconstruction technique. COMPARISON:  07/30/2022 FINDINGS: Brain: No acute infarct or hemorrhage. Lateral ventricles and midline structures are unremarkable. No acute extra-axial fluid collections. No mass effect. Vascular: No hyperdense vessel or unexpected calcification. Skull: Normal. Negative for fracture or focal lesion. Sinuses/Orbits: Mild ethmoid sinus mucosal thickening. Remaining paranasal sinuses are clear. Other: None. IMPRESSION: 1. No acute intracranial process. Electronically Signed   By: Randa Ngo M.D.   On: 12/11/2022 22:18        Scheduled Meds:  cycloSPORINE  1 drop Both Eyes Daily   heparin  5,000 Units Subcutaneous Q8H   losartan  50 mg Oral Daily   multivitamin with minerals  1 tablet Oral Daily   omega-3 acid ethyl esters  1 g Oral Daily   pantoprazole  40 mg Oral Daily   Continuous Infusions:  lactated ringers 75 mL/hr at 12/12/22 0508     LOS: 0 days   Time spent= 35 mins    Serrena Linderman Arsenio Loader, MD [facility]  If 7PM-7AM, please contact night-coverage  12/12/2022, 7:27 AM

## 2022-12-12 NOTE — Progress Notes (Signed)
Orthopedic Tech Progress Note Patient Details:  Tracy Faulkner 15-May-1932 [id number]  Ortho Devices Type of Ortho Device: Knee Immobilizer Ortho Device/Splint Location: rle Ortho Device/Splint Interventions: Ordered, Application, Adjustment  The patients RN helped me apply the brace. Post Interventions Patient Tolerated: Well Instructions Provided: Care of device, Adjustment of device  Karolee Stamps 12/12/2022, 12:31 AM

## 2022-12-12 NOTE — Progress Notes (Signed)
Patient known to me for previous L TKA in 2017. She presented to [facility] ED last night after falling while getting into the car. She had right knee pain and inability to weight bear. X-rays and CT scan in the ED show a comminuted R supracondylar femur fracture. I have discussed the patient with Dr. Marcelino Scot, orthopaedic traumatologist. Patient may have diet today, and then NPO after MN for possible surgery tomorrow. OTO Lovenox given this am. Full consult to follow.

## 2022-12-13 ENCOUNTER — Inpatient Hospital Stay (HOSPITAL_COMMUNITY): Payer: Medicare PPO | Admitting: Anesthesiology

## 2022-12-13 ENCOUNTER — Encounter (HOSPITAL_COMMUNITY): Payer: Self-pay | Admitting: Family Medicine

## 2022-12-13 ENCOUNTER — Inpatient Hospital Stay (HOSPITAL_COMMUNITY): Payer: Medicare PPO

## 2022-12-13 ENCOUNTER — Encounter (HOSPITAL_COMMUNITY)
Admission: EM | Disposition: A | Discharge status: Skilled Nursing Facility | Payer: Self-pay | Source: Home / Self Care | Attending: Internal Medicine

## 2022-12-13 ENCOUNTER — Other Ambulatory Visit: Payer: Self-pay

## 2022-12-13 DIAGNOSIS — S72401A Unspecified fracture of lower end of right femur, initial encounter for closed fracture: Secondary | ICD-10-CM | POA: Diagnosis not present

## 2022-12-13 DIAGNOSIS — S728X1D Other fracture of right femur, subsequent encounter for closed fracture with routine healing: Secondary | ICD-10-CM | POA: Diagnosis not present

## 2022-12-13 HISTORY — PX: ORIF FEMUR FRACTURE: SHX2119

## 2022-12-13 LAB — CBC
HCT: 30.6 % — ABNORMAL LOW (ref 36.0–46.0)
Hemoglobin: 9.3 g/dL — ABNORMAL LOW (ref 12.0–15.0)
MCH: 26 pg (ref 26.0–34.0)
MCHC: 30.4 g/dL (ref 30.0–36.0)
MCV: 85.5 fL (ref 80.0–100.0)
Platelets: 164 10*3/uL (ref 150–400)
RBC: 3.58 MIL/uL — ABNORMAL LOW (ref 3.87–5.11)
RDW: 16.2 % — ABNORMAL HIGH (ref 11.5–15.5)
WBC: 10 10*3/uL (ref 4.0–10.5)
nRBC: 0 % (ref 0.0–0.2)

## 2022-12-13 LAB — BASIC METABOLIC PANEL
Anion gap: 11 (ref 5–15)
BUN: 22 mg/dL (ref 8–23)
CO2: 23 mmol/L (ref 22–32)
Calcium: 9.5 mg/dL (ref 8.9–10.3)
Chloride: 113 mmol/L — ABNORMAL HIGH (ref 98–111)
Creatinine, Ser: 0.96 mg/dL (ref 0.44–1.00)
GFR, Estimated: 57 mL/min — ABNORMAL LOW (ref >60)
Glucose, Bld: 66 mg/dL — ABNORMAL LOW (ref 70–99)
Potassium: 4.3 mmol/L (ref 3.5–5.1)
Sodium: 147 mmol/L — ABNORMAL HIGH (ref 135–145)

## 2022-12-13 LAB — MAGNESIUM: Magnesium: 2.2 mg/dL (ref 1.7–2.4)

## 2022-12-13 LAB — T4, FREE: Free T4: 1.38 ng/dL — ABNORMAL HIGH (ref 0.61–1.12)

## 2022-12-13 LAB — SURGICAL PCR SCREEN
MRSA, PCR: NEGATIVE
Staphylococcus aureus: NEGATIVE

## 2022-12-13 LAB — GLUCOSE, CAPILLARY: Glucose-Capillary: 116 mg/dL — ABNORMAL HIGH (ref 70–99)

## 2022-12-13 SURGERY — OPEN REDUCTION INTERNAL FIXATION (ORIF) DISTAL FEMUR FRACTURE
Anesthesia: General | Laterality: Right

## 2022-12-13 MED ORDER — FENTANYL CITRATE (PF) 250 MCG/5ML IJ SOLN
INTRAMUSCULAR | Status: DC | PRN
  Administered 2022-12-13 (×2): 50 ug via INTRAVENOUS

## 2022-12-13 MED ORDER — OXYCODONE HCL 5 MG PO TABS: 5.0000 mg | ORAL_TABLET | Freq: Once | ORAL | Status: DC | PRN

## 2022-12-13 MED ORDER — ROCURONIUM BROMIDE 10 MG/ML (PF) SYRINGE
PREFILLED_SYRINGE | INTRAVENOUS | Status: DC | PRN
  Administered 2022-12-13: 50 mg via INTRAVENOUS

## 2022-12-13 MED ORDER — CHLORHEXIDINE GLUCONATE 0.12 % MT SOLN: 15.0000 mL | Freq: Once | OROMUCOSAL | Status: AC

## 2022-12-13 MED ORDER — LACTATED RINGERS IV SOLN: INTRAVENOUS | Status: DC

## 2022-12-13 MED ORDER — GLYCOPYRROLATE 0.2 MG/ML IJ SOLN
INTRAMUSCULAR | Status: DC | PRN
  Administered 2022-12-13: .2 mg via INTRAVENOUS

## 2022-12-13 MED ORDER — LIDOCAINE 2% (20 MG/ML) 5 ML SYRINGE
INTRAMUSCULAR | Status: DC | PRN
  Administered 2022-12-13: 50 mg via INTRAVENOUS

## 2022-12-13 MED ORDER — DOCUSATE SODIUM 100 MG PO CAPS
100.0000 mg | ORAL_CAPSULE | Freq: Two times a day (BID) | ORAL | Status: DC
  Administered 2022-12-14 – 2022-12-15 (×2): 100 mg via ORAL
  Filled 2022-12-13 (×3): qty 1

## 2022-12-13 MED ORDER — ACETAMINOPHEN 325 MG PO TABS: 650.0000 mg | ORAL_TABLET | Freq: Four times a day (QID) | ORAL | Status: DC

## 2022-12-13 MED ORDER — ORAL CARE MOUTH RINSE: 15.0000 mL | Freq: Once | OROMUCOSAL | Status: AC

## 2022-12-13 MED ORDER — PROPOFOL 10 MG/ML IV BOLUS
INTRAVENOUS | Status: DC | PRN
  Administered 2022-12-13: 80 mg via INTRAVENOUS

## 2022-12-13 MED ORDER — DEXAMETHASONE SODIUM PHOSPHATE 10 MG/ML IJ SOLN
INTRAMUSCULAR | Status: DC | PRN
  Administered 2022-12-13: 10 mg via INTRAVENOUS

## 2022-12-13 MED ORDER — FENTANYL CITRATE (PF) 100 MCG/2ML IJ SOLN: 25.0000 ug | INTRAMUSCULAR | Status: DC | PRN

## 2022-12-13 MED ORDER — METOCLOPRAMIDE HCL 5 MG PO TABS: 5.0000 mg | ORAL_TABLET | Freq: Three times a day (TID) | ORAL | Status: DC | PRN

## 2022-12-13 MED ORDER — 0.9 % SODIUM CHLORIDE (POUR BTL) OPTIME
TOPICAL | Status: DC | PRN
  Administered 2022-12-13: 1000 mL

## 2022-12-13 MED ORDER — VANCOMYCIN HCL IN DEXTROSE 1-5 GM/200ML-% IV SOLN
1000.0000 mg | Freq: Two times a day (BID) | INTRAVENOUS | Status: AC
  Administered 2022-12-13: 1000 mg via INTRAVENOUS
  Filled 2022-12-13: qty 200

## 2022-12-13 MED ORDER — FENTANYL CITRATE (PF) 250 MCG/5ML IJ SOLN
INTRAMUSCULAR | Status: AC
  Filled 2022-12-13: qty 5

## 2022-12-13 MED ORDER — POLYETHYLENE GLYCOL 3350 17 G PO PACK: 17.0000 g | PACK | Freq: Every day | ORAL | Status: DC | PRN

## 2022-12-13 MED ORDER — VANCOMYCIN HCL 1000 MG IV SOLR
INTRAVENOUS | Status: AC
  Filled 2022-12-13: qty 20

## 2022-12-13 MED ORDER — SUGAMMADEX SODIUM 200 MG/2ML IV SOLN
INTRAVENOUS | Status: DC | PRN
  Administered 2022-12-13: 200 mg via INTRAVENOUS

## 2022-12-13 MED ORDER — MORPHINE SULFATE (PF) 2 MG/ML IV SOLN
0.5000 mg | INTRAVENOUS | Status: DC | PRN
  Administered 2022-12-14: 1 mg via INTRAVENOUS
  Administered 2022-12-14: 0.5 mg via INTRAVENOUS
  Filled 2022-12-13 (×2): qty 1

## 2022-12-13 MED ORDER — PROPOFOL 10 MG/ML IV BOLUS
INTRAVENOUS | Status: AC
  Filled 2022-12-13: qty 20

## 2022-12-13 MED ORDER — VANCOMYCIN HCL 1000 MG IV SOLR
INTRAVENOUS | Status: DC | PRN
  Administered 2022-12-13: 1000 mg via TOPICAL

## 2022-12-13 MED ORDER — PHENYLEPHRINE HCL-NACL 20-0.9 MG/250ML-% IV SOLN
INTRAVENOUS | Status: DC | PRN
  Administered 2022-12-13: 50 ug/min via INTRAVENOUS

## 2022-12-13 MED ORDER — CHLORHEXIDINE GLUCONATE 0.12 % MT SOLN
OROMUCOSAL | Status: AC
  Administered 2022-12-13: 15 mL via OROMUCOSAL
  Filled 2022-12-13: qty 15

## 2022-12-13 MED ORDER — ONDANSETRON HCL 4 MG/2ML IJ SOLN
INTRAMUSCULAR | Status: DC | PRN
  Administered 2022-12-13: 4 mg via INTRAVENOUS

## 2022-12-13 MED ORDER — DEXAMETHASONE SODIUM PHOSPHATE 10 MG/ML IJ SOLN
INTRAMUSCULAR | Status: AC
  Filled 2022-12-13: qty 1

## 2022-12-13 MED ORDER — ACETAMINOPHEN 325 MG PO TABS
650.0000 mg | ORAL_TABLET | Freq: Four times a day (QID) | ORAL | Status: DC | PRN
  Administered 2022-12-13 – 2022-12-14 (×3): 650 mg via ORAL
  Filled 2022-12-13 (×4): qty 2

## 2022-12-13 MED ORDER — ENOXAPARIN SODIUM 40 MG/0.4ML IJ SOSY: 40.0000 mg | PREFILLED_SYRINGE | INTRAMUSCULAR | Status: DC

## 2022-12-13 MED ORDER — CEFAZOLIN SODIUM-DEXTROSE 2-4 GM/100ML-% IV SOLN
INTRAVENOUS | Status: AC
  Filled 2022-12-13: qty 100

## 2022-12-13 MED ORDER — ONDANSETRON HCL 4 MG/2ML IJ SOLN: 4.0000 mg | Freq: Once | INTRAMUSCULAR | Status: DC | PRN

## 2022-12-13 MED ORDER — GLYCOPYRROLATE PF 0.2 MG/ML IJ SOSY
PREFILLED_SYRINGE | INTRAMUSCULAR | Status: AC
  Filled 2022-12-13: qty 1

## 2022-12-13 MED ORDER — PHENYLEPHRINE 80 MCG/ML (10ML) SYRINGE FOR IV PUSH (FOR BLOOD PRESSURE SUPPORT)
PREFILLED_SYRINGE | INTRAVENOUS | Status: DC | PRN
  Administered 2022-12-13: 60 ug via INTRAVENOUS
  Administered 2022-12-13: 80 ug via INTRAVENOUS
  Administered 2022-12-13: 60 ug via INTRAVENOUS

## 2022-12-13 MED ORDER — ONDANSETRON HCL 4 MG/2ML IJ SOLN
INTRAMUSCULAR | Status: AC
  Filled 2022-12-13: qty 2

## 2022-12-13 MED ORDER — ROCURONIUM BROMIDE 10 MG/ML (PF) SYRINGE
PREFILLED_SYRINGE | INTRAVENOUS | Status: AC
  Filled 2022-12-13: qty 10

## 2022-12-13 MED ORDER — CEFAZOLIN SODIUM-DEXTROSE 2-3 GM-%(50ML) IV SOLR
INTRAVENOUS | Status: DC | PRN
  Administered 2022-12-13: 2 g via INTRAVENOUS

## 2022-12-13 MED ORDER — DIPHENHYDRAMINE HCL 12.5 MG/5ML PO ELIX: 12.5000 mg | ORAL_SOLUTION | ORAL | Status: DC | PRN

## 2022-12-13 MED ORDER — OXYCODONE HCL 5 MG/5ML PO SOLN: 5.0000 mg | Freq: Once | ORAL | Status: DC | PRN

## 2022-12-13 MED ORDER — METOCLOPRAMIDE HCL 5 MG/ML IJ SOLN: 5.0000 mg | Freq: Three times a day (TID) | INTRAMUSCULAR | Status: DC | PRN

## 2022-12-13 MED ORDER — LIDOCAINE 2% (20 MG/ML) 5 ML SYRINGE
INTRAMUSCULAR | Status: AC
  Filled 2022-12-13: qty 5

## 2022-12-13 MED ORDER — CEFAZOLIN SODIUM-DEXTROSE 2-4 GM/100ML-% IV SOLN: 2.0000 g | Freq: Three times a day (TID) | INTRAVENOUS | Status: DC

## 2022-12-13 MED ORDER — PHENYLEPHRINE 80 MCG/ML (10ML) SYRINGE FOR IV PUSH (FOR BLOOD PRESSURE SUPPORT)
PREFILLED_SYRINGE | INTRAVENOUS | Status: AC
  Filled 2022-12-13: qty 10

## 2022-12-13 SURGICAL SUPPLY — 67 items
BAG COUNTER SPONGE SURGICOUNT (BAG) ×1 IMPLANT
BIT DRILL 4.3 (BIT) ×1
BIT DRILL 4.3X300MM (BIT) IMPLANT
BIT DRILL LONG 3.3 (BIT) IMPLANT
BIT DRILL QC 3.3X195 (BIT) IMPLANT
BLADE CLIPPER SURG (BLADE) IMPLANT
BNDG COHESIVE 6X5 TAN STRL LF (GAUZE/BANDAGES/DRESSINGS) ×1 IMPLANT
BNDG ELASTIC 4X5.8 VLCR STR LF (GAUZE/BANDAGES/DRESSINGS) IMPLANT
BNDG ELASTIC 6X10 VLCR STRL LF (GAUZE/BANDAGES/DRESSINGS) ×1 IMPLANT
BNDG ELASTIC 6X5.8 VLCR STR LF (GAUZE/BANDAGES/DRESSINGS) IMPLANT
BRUSH SCRUB EZ PLAIN DRY (MISCELLANEOUS) ×2 IMPLANT
CANISTER SUCT 3000ML PPV (MISCELLANEOUS) ×1 IMPLANT
CAP LOCK NCB (Cap) IMPLANT
CHLORAPREP W/TINT 26 (MISCELLANEOUS) ×1 IMPLANT
COVER SURGICAL LIGHT HANDLE (MISCELLANEOUS) ×1 IMPLANT
DRAPE C-ARM 42X72 X-RAY (DRAPES) ×1 IMPLANT
DRAPE C-ARMOR (DRAPES) ×1 IMPLANT
DRAPE HALF SHEET 40X57 (DRAPES) ×2 IMPLANT
DRAPE ORTHO SPLIT 77X108 STRL (DRAPES) ×2
DRAPE SURG 17X23 STRL (DRAPES) ×1 IMPLANT
DRAPE SURG ORHT 6 SPLT 77X108 (DRAPES) ×2 IMPLANT
DRAPE U-SHAPE 47X51 STRL (DRAPES) ×1 IMPLANT
DRSG ADAPTIC 3X8 NADH LF (GAUZE/BANDAGES/DRESSINGS) IMPLANT
DRSG MEPILEX BORDER 4X12 (GAUZE/BANDAGES/DRESSINGS) IMPLANT
DRSG MEPILEX BORDER 4X4 (GAUZE/BANDAGES/DRESSINGS) IMPLANT
DRSG MEPILEX BORDER 4X8 (GAUZE/BANDAGES/DRESSINGS) IMPLANT
DRSG MEPITEL 4X7.2 (GAUZE/BANDAGES/DRESSINGS) IMPLANT
ELECT REM PT RETURN 9FT ADLT (ELECTROSURGICAL) ×1
ELECTRODE REM PT RTRN 9FT ADLT (ELECTROSURGICAL) ×1 IMPLANT
GAUZE PAD ABD 8X10 STRL (GAUZE/BANDAGES/DRESSINGS) ×3 IMPLANT
GAUZE SPONGE 4X4 12PLY STRL (GAUZE/BANDAGES/DRESSINGS) ×1 IMPLANT
GLOVE BIO SURGEON STRL SZ 6.5 (GLOVE) ×3 IMPLANT
GLOVE BIO SURGEON STRL SZ7.5 (GLOVE) ×4 IMPLANT
GLOVE BIOGEL PI IND STRL 6.5 (GLOVE) ×1 IMPLANT
GLOVE BIOGEL PI IND STRL 7.5 (GLOVE) ×1 IMPLANT
GOWN STRL REUS W/ TWL LRG LVL3 (GOWN DISPOSABLE) ×3 IMPLANT
GOWN STRL REUS W/TWL LRG LVL3 (GOWN DISPOSABLE) ×3
K-WIRE 2.0 (WIRE) ×1
K-WIRE FXSTD 280X2XNS SS (WIRE) ×1
KIT BASIN OR (CUSTOM PROCEDURE TRAY) ×1 IMPLANT
KIT TURNOVER KIT B (KITS) ×1 IMPLANT
KWIRE FXSTD 280X2XNS SS (WIRE) IMPLANT
NS IRRIG 1000ML POUR BTL (IV SOLUTION) ×1 IMPLANT
PACK TOTAL JOINT (CUSTOM PROCEDURE TRAY) ×1 IMPLANT
PAD ARMBOARD 7.5X6 YLW CONV (MISCELLANEOUS) ×2 IMPLANT
PAD CAST 4YDX4 CTTN HI CHSV (CAST SUPPLIES) ×1 IMPLANT
PADDING CAST COTTON 4X4 STRL (CAST SUPPLIES) ×1
PADDING CAST COTTON 6X4 STRL (CAST SUPPLIES) ×1 IMPLANT
PLATE FEM DIST NCB PP 278MM (Plate) IMPLANT
SCREW CORTICAL NCB 5.0X90MM (Screw) IMPLANT
SCREW NCB 4.0X36MM (Screw) IMPLANT
SCREW NCB 5.0X36MM (Screw) IMPLANT
SCREW NCB 5.0X85MM (Screw) IMPLANT
SPONGE T-LAP 18X18 ~~LOC~~+RFID (SPONGE) IMPLANT
STAPLER VISISTAT 35W (STAPLE) ×1 IMPLANT
SUCTION FRAZIER HANDLE 10FR (MISCELLANEOUS) ×1
SUCTION TUBE FRAZIER 10FR DISP (MISCELLANEOUS) ×1 IMPLANT
SUT ETHILON 3 0 PS 1 (SUTURE) ×2 IMPLANT
SUT VIC AB 0 CT1 27 (SUTURE)
SUT VIC AB 0 CT1 27XBRD ANBCTR (SUTURE) IMPLANT
SUT VIC AB 1 CT1 27 (SUTURE)
SUT VIC AB 1 CT1 27XBRD ANBCTR (SUTURE) IMPLANT
SUT VIC AB 2-0 CT1 27 (SUTURE) ×2
SUT VIC AB 2-0 CT1 TAPERPNT 27 (SUTURE) ×2 IMPLANT
TOWEL GREEN STERILE (TOWEL DISPOSABLE) ×2 IMPLANT
TRAY FOLEY MTR SLVR 16FR STAT (SET/KITS/TRAYS/PACK) IMPLANT
WATER STERILE IRR 1000ML POUR (IV SOLUTION) ×2 IMPLANT

## 2022-12-13 NOTE — Anesthesia Preprocedure Evaluation (Addendum)
Anesthesia Evaluation  Patient identified by MRN, date of birth, ID band Patient awake    Reviewed: Allergy & Precautions, H&P , NPO status , Patient's Chart, lab work & pertinent test results  Airway Mallampati: II  TM Distance: >3 FB Neck ROM: Full    Dental no notable dental hx.    Pulmonary neg pulmonary ROS   Pulmonary exam normal breath sounds clear to auscultation       Cardiovascular hypertension, Normal cardiovascular exam Rhythm:Regular Rate:Normal     Neuro/Psych negative neurological ROS  negative psych ROS   GI/Hepatic negative GI ROS, Neg liver ROS,,,  Endo/Other  negative endocrine ROS    Renal/GU negative Renal ROS  negative genitourinary   Musculoskeletal negative musculoskeletal ROS (+)    Abdominal   Peds negative pediatric ROS (+)  Hematology  (+) Blood dyscrasia, anemia   Anesthesia Other Findings   Reproductive/Obstetrics negative OB ROS                             Anesthesia Physical Anesthesia Plan  ASA: 2  Anesthesia Plan: General   Post-op Pain Management:    Induction: Intravenous  PONV Risk Score and Plan: 3 and Ondansetron, Dexamethasone and Treatment may vary due to age or medical condition  Airway Management Planned: Oral ETT  Additional Equipment:   Intra-op Plan:   Post-operative Plan: Extubation in OR  Informed Consent: I have reviewed the patients History and Physical, chart, labs and discussed the procedure including the risks, benefits and alternatives for the proposed anesthesia with the patient or authorized representative who has indicated his/her understanding and acceptance.     Dental advisory given  Plan Discussed with: CRNA and Surgeon  Anesthesia Plan Comments:        Anesthesia Quick Evaluation

## 2022-12-13 NOTE — Interval H&P Note (Signed)
History and Physical Interval Note:  12/13/2022 7:46 AM  Tracy Faulkner  has presented today for surgery, with the diagnosis of Right Distal Femur Fx.  The various methods of treatment have been discussed with the patient and family. After consideration of risks, benefits and other options for treatment, the patient has consented to  Procedure(s): OPEN REDUCTION INTERNAL FIXATION (ORIF) DISTAL FEMUR FRACTURE (Right) as a surgical intervention.  The patient's history has been reviewed, patient examined, no change in status, stable for surgery.  I have reviewed the patient's chart and labs.  Questions were answered to the patient's satisfaction.     [name] P [name]

## 2022-12-13 NOTE — H&P (View-Only) (Signed)
Orthopaedic Trauma Service (OTS) Consult   Patient ID: Tracy Faulkner MRN: [id number] DOB/AGE: May 17, 1932 89 y.o.  Reason for Consult:Right distal femur fracture Referring Physician: Dr. Rod Can, MD [facility]  HPI: Tracy Faulkner is an 89 y.o. female who is being seen in consultation at the request of Dr. Lyla Glassing for evaluation of right distal femur fracture.  Patient sustained a ground-level fall while at home sustaining a right distal femur fracture.  Patient was seen and evaluated by the hospitalist and admitted.  Dr. Lyla Glassing was called for consultation but unfortunately due to OR scheduling and complexity of her injury he recommended treatment by an orthopedic traumatologist.  Patient was seen and evaluated on 2 W.  Daughter is at bedside.  The patient ambulates with a cane intermittently with a walker.  She lives at home with her daughter her granddaughter and grandsons so 4 generations of family members in the household.  The family has been going through a recent tragedy as one of her sons was planning to go on a cruise and sustained a massive heart attack and passed away.  The services scheduled for later this week.  The patient herself is drowsy this morning she does respond appropriately to commands but most of the history is obtained by her daughter who is at bedside.  Denies any significant numbness or tingling denies any injuries anywhere else.  Past Medical History:  Diagnosis Date   Anemia    Arthritis    Ataxia    Back problem    Bowel trouble    Breast cancer ([place])    Carpal tunnel syndrome    Constipation    Disturbance, sleep    Dyspnea    increased exertion    Frequent headaches    GI bleed    History of mammogram 2000   History of urinary tract infection    Hypertension    Obesity    Pain in both knees    Sepsis ([place])    Urinary incontinence     Past Surgical History:  Procedure Laterality Date   BREAST SURGERY     for cancer-right breast    COLON SURGERY     secondary to bowel blockages   EYE SURGERY     cataract surgery bilateral    gastric stapling     KNEE ARTHROPLASTY Left 10/07/2016   Procedure: LEFT TOTAL KNEE ARTHROPLASTY WITH COMPUTER NAVIGATION;  Surgeon: Rod Can, MD;  Location: [facility];  Service: Orthopedics;  Laterality: Left;    Family History  Problem Relation Age of Onset   Arthritis Mother    Diabetes Mellitus II Mother    Hypertension Mother    Breast cancer Mother    Heart disease Father     Social History:  reports that she has never smoked. She has never used smokeless tobacco. She reports that she does not drink alcohol and does not use drugs.  Allergies:  Allergies  Allergen Reactions   Ace Inhibitors Cough   Lortab [Hydrocodone-Acetaminophen] Other (See Comments)    Bad dreams   Remeron [Mirtazapine] Other (See Comments)    Hallucinations (can take 1/2 tablet--7.5 mg), not 15 mg   Penicillins Rash and Other (See Comments)    Throat swelling, Has patient had a PCN reaction causing immediate rash, facial/tongue/throat swelling, SOB or lightheadedness with hypotension: Yes Has patient had a PCN reaction causing severe rash involving mucus membranes or skin necrosis: No Has patient had a PCN reaction that required hospitalization No Has patient  had a PCN reaction occurring within the last 10 years: No If all of the above answers are "NO", then may proceed with Cephalosporin use.     Medications:  No current facility-administered medications on file prior to encounter.   Current Outpatient Medications on File Prior to Encounter  Medication Sig Dispense Refill   ACETAMINOPHEN ER PO Take 500 mg by mouth daily as needed for pain.     bisacodyl (DULCOLAX) 5 MG EC tablet Take 1 tablet (5 mg total) by mouth daily as needed for moderate constipation. 30 tablet 0   CALCIUM PO Take 1 capsule by mouth daily.     cycloSPORINE (RESTASIS) 0.05 % ophthalmic emulsion Place 1 drop into both eyes daily.  (Patient taking differently: Place 1 drop into both eyes 2 (two) times daily.) 0.4 mL 3   losartan (COZAAR) 50 MG tablet Take 1 tablet (50 mg total) by mouth daily. 90 tablet 2   Multiple Vitamin (MULTIVITAMIN) tablet Take 1 tablet by mouth daily.     Omega-3 Fatty Acids (FISH OIL PO) Take 1 capsule by mouth daily.     omeprazole (PRILOSEC) 40 MG capsule TAKE 1 CAPSULE BY MOUTH ONCE DAILY BEFORE BREAKFAST (Patient taking differently: Take 40 mg by mouth daily.) 90 capsule 1   OVER THE COUNTER MEDICATION Take 1 capsule by mouth daily. Gluco Gel Joint & Cartilage Support     OVER THE COUNTER MEDICATION Apply 1 application  topically daily as needed (pain relief). Youngevity Pain Cream     cholecalciferol (VITAMIN D3) 10 MCG (400 UNIT) TABS tablet Take 1 tablet (400 Units total) by mouth daily. (Patient not taking: Reported on 12/12/2022) 30 tablet 0     ROS: Constitutional: No fever or chills Vision: No changes in vision ENT: No difficulty swallowing CV: No chest pain Pulm: No SOB or wheezing GI: No nausea or vomiting GU: No urgency or inability to hold urine Skin: No poor wound healing Neurologic: No numbness or tingling Psychiatric: No depression or anxiety Heme: No bruising Allergic: No reaction to medications or food   Exam: Blood pressure 96/60, pulse 73, temperature 98.7 F (37.1 C), temperature source Oral, resp. rate 16, SpO2 100 %. General: No acute distress Orientation: Drowsy but awakens and responds Mood and Affect: Cooperative and pleasant Gait: Unable to assess due to her fracture Coordination and balance: Within normal limits  Right lower extremity: Knee immobilizer is in place.  The leg is shortened and externally rotated.  Unable to tolerate any range of motion.  Lateral and sensory function intact to the right lower extremity.  Compartments are soft compressible.  No skin wounds or open lesions on her leg.  No deformity through the lower portion of her leg.  Left  lower extremity: Skin without lesions. No tenderness to palpation. Full painless ROM, full strength in each muscle groups without evidence of instability.   Medical Decision Making: Data: Imaging: X-rays and CT scan of the right knee are reviewed which shows a extra-articular distal femur fracture.  There is what appears to be a calcific nodule within the knee joint.  This appears benign in nature.  There is tricompartmental knee arthritis within the knee.  Labs:  Results for orders placed or performed during the hospital encounter of 12/11/22 (from the past 24 hour(s))  TSH     Status: Abnormal   Collection Time: 12/12/22  7:31 AM  Result Value Ref Range   TSH 0.304 (L) 0.350 - 4.500 uIU/mL  VITAMIN D 25 Hydroxy (  Vit-D Deficiency, Fractures)     Status: Abnormal   Collection Time: 12/12/22  7:31 AM  Result Value Ref Range   Vit D, 25-Hydroxy 24.93 (L) 30 - 100 ng/mL  Surgical PCR screen     Status: None   Collection Time: 12/12/22 10:50 PM   Specimen: Nasal Mucosa; Nasal Swab  Result Value Ref Range   MRSA, PCR NEGATIVE NEGATIVE   Staphylococcus aureus NEGATIVE NEGATIVE  Basic metabolic panel     Status: Abnormal   Collection Time: 12/13/22 12:11 AM  Result Value Ref Range   Sodium 147 (H) 135 - 145 mmol/L   Potassium 4.3 3.5 - 5.1 mmol/L   Chloride 113 (H) 98 - 111 mmol/L   CO2 23 22 - 32 mmol/L   Glucose, Bld 66 (L) 70 - 99 mg/dL   BUN 22 8 - 23 mg/dL   Creatinine, Ser 0.96 0.44 - 1.00 mg/dL   Calcium 9.5 8.9 - 10.3 mg/dL   GFR, Estimated 57 (L) >60 mL/min   Anion gap 11 5 - 15  CBC     Status: Abnormal   Collection Time: 12/13/22 12:11 AM  Result Value Ref Range   WBC 10.0 4.0 - 10.5 K/uL   RBC 3.58 (L) 3.87 - 5.11 MIL/uL   Hemoglobin 9.3 (L) 12.0 - 15.0 g/dL   HCT 30.6 (L) 36.0 - 46.0 %   MCV 85.5 80.0 - 100.0 fL   MCH 26.0 26.0 - 34.0 pg   MCHC 30.4 30.0 - 36.0 g/dL   RDW 16.2 (H) 11.5 - 15.5 %   Platelets 164 150 - 400 K/uL   nRBC 0.0 0.0 - 0.2 %  Magnesium      Status: None   Collection Time: 12/13/22 12:11 AM  Result Value Ref Range   Magnesium 2.2 1.7 - 2.4 mg/dL  T4, free     Status: Abnormal   Collection Time: 12/13/22 12:11 AM  Result Value Ref Range   Free T4 1.38 (H) 0.61 - 1.12 ng/dL     Imaging or Labs ordered: None  Medical history and chart was reviewed and case discussed with medical provider.  Assessment/Plan: 89 year old female with a right distal femur fracture.  Due to the unstable nature of her injury and debilitating nature of these injuries I recommend proceeding with open reduction internal fixation.  I discussed risks and benefits of surgical intervention with the patient and her daughter.  I discussed the risk of nonoperative management including inability to ambulate risk of developing multiple complications including debility, bedsores, pneumonia, and UTIs.  I discussed the risks of surgery including bleeding infection, malunion, nonunion, hardware failure, hardware irritation, nerve or blood vessel injury, knee stiffness, DVT, even the possibility anesthetic complications.  I discussed with her that in my opinion she would not be able to ambulate with nonoperative management and that she would likely be debilitated for the remainder of her life.  After full discussion the patient and her daughter wish to proceed with surgery.  Consent will be obtained.  Shona Needles, MD [facility] [phone] (office) [email or web address]

## 2022-12-13 NOTE — Op Note (Signed)
Orthopaedic Surgery Operative Note (CSN: [id number] ) Date of Surgery: 12/13/2022  Admit Date: 12/11/2022   Diagnoses: Pre-Op Diagnoses: Right supracondylar distal femur fracture  Post-Op Diagnosis: Same  Procedures: CPT 27511-Open reduction internal fixation of right distal femur fracture  Surgeons : Primary: Shona Needles, MD  Assistant: Patrecia Pace, PA-C  Location: OR 3   Anesthesia: General   Antibiotics: Ancef 2g preop with 1 gm vancomycin powder placed topically   Tourniquet time: None used    Estimated Blood Loss: 431 mL  Complications: None  Specimens:None   Implants: Implant Name Type Inv. Item Serial No. Manufacturer Lot No. LRB No. Used Action  SCREW CORTICAL NCB 5.0X90MM - [id number] Screw SCREW CORTICAL NCB 5.0X90MM  [employer](ORTH,TRAU,BIO,SG) [id number] Right 1 Implanted  CAP LOCK NCB - [id number] Cap CAP LOCK NCB  [employer](ORTH,TRAU,BIO,SG) ON TRAY Right 7 Implanted  PLATE FEM DIST NCB PP 278MM - [id number] Plate PLATE FEM DIST NCB PP 278MM  [employer](ORTH,TRAU,BIO,SG) ON TRAY Right 1 Implanted  SCREW NCB 5.0X85MM - [id number] Screw SCREW NCB 5.0X85MM  [employer](ORTH,TRAU,BIO,SG) ON TRAY Right 4 Implanted  SCREW NCB 5.0X36MM - [id number] Screw SCREW NCB 5.0X36MM  [employer](ORTH,TRAU,BIO,SG) ON TRAY Right 2 Implanted  SCREW NCB 4.0X36MM - [id number] Screw SCREW NCB 4.0X36MM  [employer](ORTH,TRAU,BIO,SG) ON TRAY Right 1 Implanted     Indications for Surgery: 89 year old female with a right distal femur fracture. Due to the unstable nature of her injury and debilitating nature of these injuries I recommend proceeding with open reduction internal fixation.  I discussed risks and benefits of surgical intervention with the patient and her daughter.  I discussed the risk of nonoperative management including inability to ambulate risk of developing multiple complications including debility, bedsores, pneumonia, and UTIs.  I discussed the risks of  surgery including bleeding infection, malunion, nonunion, hardware failure, hardware irritation, nerve or blood vessel injury, knee stiffness, DVT, even the possibility anesthetic complications.  I discussed with her that in my opinion she would not be able to ambulate with nonoperative management and that she would likely be debilitated for the remainder of her life.  After full discussion the patient and her daughter wish to proceed with surgery.  Consent will be obtained.  Operative Findings: Open reduction internal fixation right supracondylar distal femur fracture using [employer] NCB distal femoral locking plate  Procedure: The patient was identified in the preoperative holding area. Consent was confirmed with the patient and their family and all questions were answered. The operative extremity was marked after confirmation with the patient. she was then brought back to the operating room by our anesthesia colleagues.  She was placed under general anesthetic and carefully transferred over to radiolucent flattop table.  A bump was placed under her operative hip.  The right lower extremity was then prepped and draped in usual sterile fashion.  Timeout was performed to verify the patient, the procedure, and the extremity.  Preoperative antibiotics were dosed.  Fluoroscopic imaging was obtained to show the unstable nature of her injury.  The hip and knee were flexed over a triangle.  Traction was applied and alignment was maintained.  A lateral approach to the distal femur was carried down through skin and subcutaneous tissue.  I incised through the IT band and expose the lateral condyle.  I then developed the plane between the vastus lateralis and the lateral cortex.  I then placed a 12 hole [employer] NCB distal femoral locking plate to a targeting arm and slid this  submuscularly along the lateral cortex of the femur.  I held provisionally with a K wire distally and then a percutaneous 3.3 mm drill  bit proximally.  I then placed a 5.0 millimeter screws distally to bring the plate flush to bone.  I then percutaneously placed 5.0 millimeter screws into the femoral shaft to correct the coronal alignment.  I removed the 3.3 mm drill bit and then placed a 4.0 millimeter screw.  I then placed locking caps on both of the 5.0 millimeter screws in the femoral shaft and I removed the targeting arm.  I then returned to the distal segment and placed 3 more 5.0 mm threaded screws.  Excellent fixation was obtained and locking caps were placed in all the distal screws.  Final fluoroscopic imaging was obtained.  The incision was then copiously irrigated.  A gram of vancomycin powder was placed into the incision.  A layered closure of 0 Vicryl, 2-0 Vicryl, 3-0 Monocryl and Dermabond was used to close the skin.  Sterile dressings were applied.  The patient was then awoke from anesthesia and taken to the PACU in stable condition.  Post Op Plan/Instructions: Patient will be weightbearing as tolerated to the right lower extremity.  She will receive postoperative Ancef.  She will receive Lovenox for DVT prophylaxis.  Will likely plan to discharge on oral DOAC.  Will have her mobilize with physical and Occupational Therapy.  No knee range of motion restrictions.  I was present and performed the entire surgery.  Patrecia Pace, PA-C did assist me throughout the case. An assistant was necessary given the difficulty in approach, maintenance of reduction and ability to instrument the fracture.   Katha Hamming, MD [facility]

## 2022-12-13 NOTE — Progress Notes (Addendum)
Patient's daughter is requesting to speak t the provider prior to surgery. Informed surgeon via [place] regarding this matter, waiting on a response at this time. Decline CHG baths and to sign consent till provider is spoken to.   Informed Rushie Nyhan, PA:  04:56am: Ok, Dr. Doreatha Martin will talk to the daughter this morning

## 2022-12-13 NOTE — Transfer of Care (Signed)
Immediate Anesthesia Transfer of Care Note  Patient: Tracy Faulkner  Procedure(s) Performed: OPEN REDUCTION INTERNAL FIXATION (ORIF) DISTAL FEMUR FRACTURE (Right)  Patient Location: PACU  Anesthesia Type:General  Level of Consciousness: awake, alert , and oriented  Airway & Oxygen Therapy: Patient Spontanous Breathing and Patient connected to face mask oxygen  Post-op Assessment: Report given to RN, Post -op Vital signs reviewed and stable, and Patient moving all extremities X 4  Post vital signs: Reviewed and stable  Last Vitals:  Vitals Value Taken Time  BP 136/74 12/13/22 0945  Temp 36.4 C 12/13/22 0930  Pulse 68 12/13/22 0955  Resp 14 12/13/22 0955  SpO2 100 % 12/13/22 0955  Vitals shown include unvalidated device data.  Last Pain:  Vitals:   12/13/22 0930  TempSrc:   PainSc: Asleep      Patients Stated Pain Goal: 0 (22/63/33 5456)  Complications: No notable events documented.

## 2022-12-13 NOTE — Consult Note (Signed)
Orthopaedic Trauma Service (OTS) Consult   Patient ID: Tracy Faulkner MRN: [id number] DOB/AGE: 87-Jul-1933 87 y.o.  Reason for Consult:Right distal femur fracture Referring Physician: Dr. Rod Can, MD [facility]  HPI: Tracy Faulkner is an 89 y.o. female who is being seen in consultation at the request of Dr. Lyla Glassing for evaluation of right distal femur fracture.  Patient sustained a ground-level fall while at home sustaining a right distal femur fracture.  Patient was seen and evaluated by the hospitalist and admitted.  Dr. Lyla Glassing was called for consultation but unfortunately due to OR scheduling and complexity of her injury he recommended treatment by an orthopedic traumatologist.  Patient was seen and evaluated on 2 W.  Daughter is at bedside.  The patient ambulates with a cane intermittently with a walker.  She lives at home with her daughter her granddaughter and grandsons so 4 generations of family members in the household.  The family has been going through a recent tragedy as one of her sons was planning to go on a cruise and sustained a massive heart attack and passed away.  The services scheduled for later this week.  The patient herself is drowsy this morning she does respond appropriately to commands but most of the history is obtained by her daughter who is at bedside.  Denies any significant numbness or tingling denies any injuries anywhere else.  Past Medical History:  Diagnosis Date   Anemia    Arthritis    Ataxia    Back problem    Bowel trouble    Breast cancer ([place])    Carpal tunnel syndrome    Constipation    Disturbance, sleep    Dyspnea    increased exertion    Frequent headaches    GI bleed    History of mammogram 2000   History of urinary tract infection    Hypertension    Obesity    Pain in both knees    Sepsis ([place])    Urinary incontinence     Past Surgical History:  Procedure Laterality Date   BREAST SURGERY     for cancer-right breast    COLON SURGERY     secondary to bowel blockages   EYE SURGERY     cataract surgery bilateral    gastric stapling     KNEE ARTHROPLASTY Left 10/07/2016   Procedure: LEFT TOTAL KNEE ARTHROPLASTY WITH COMPUTER NAVIGATION;  Surgeon: Rod Can, MD;  Location: [facility];  Service: Orthopedics;  Laterality: Left;    Family History  Problem Relation Age of Onset   Arthritis Mother    Diabetes Mellitus II Mother    Hypertension Mother    Breast cancer Mother    Heart disease Father     Social History:  reports that she has never smoked. She has never used smokeless tobacco. She reports that she does not drink alcohol and does not use drugs.  Allergies:  Allergies  Allergen Reactions   Ace Inhibitors Cough   Lortab [Hydrocodone-Acetaminophen] Other (See Comments)    Bad dreams   Remeron [Mirtazapine] Other (See Comments)    Hallucinations (can take 1/2 tablet--7.5 mg), not 15 mg   Penicillins Rash and Other (See Comments)    Throat swelling, Has patient had a PCN reaction causing immediate rash, facial/tongue/throat swelling, SOB or lightheadedness with hypotension: Yes Has patient had a PCN reaction causing severe rash involving mucus membranes or skin necrosis: No Has patient had a PCN reaction that required hospitalization No Has patient  had a PCN reaction occurring within the last 10 years: No If all of the above answers are "NO", then may proceed with Cephalosporin use.     Medications:  No current facility-administered medications on file prior to encounter.   Current Outpatient Medications on File Prior to Encounter  Medication Sig Dispense Refill   ACETAMINOPHEN ER PO Take 500 mg by mouth daily as needed for pain.     bisacodyl (DULCOLAX) 5 MG EC tablet Take 1 tablet (5 mg total) by mouth daily as needed for moderate constipation. 30 tablet 0   CALCIUM PO Take 1 capsule by mouth daily.     cycloSPORINE (RESTASIS) 0.05 % ophthalmic emulsion Place 1 drop into both eyes daily.  (Patient taking differently: Place 1 drop into both eyes 2 (two) times daily.) 0.4 mL 3   losartan (COZAAR) 50 MG tablet Take 1 tablet (50 mg total) by mouth daily. 90 tablet 2   Multiple Vitamin (MULTIVITAMIN) tablet Take 1 tablet by mouth daily.     Omega-3 Fatty Acids (FISH OIL PO) Take 1 capsule by mouth daily.     omeprazole (PRILOSEC) 40 MG capsule TAKE 1 CAPSULE BY MOUTH ONCE DAILY BEFORE BREAKFAST (Patient taking differently: Take 40 mg by mouth daily.) 90 capsule 1   OVER THE COUNTER MEDICATION Take 1 capsule by mouth daily. Gluco Gel Joint & Cartilage Support     OVER THE COUNTER MEDICATION Apply 1 application  topically daily as needed (pain relief). Youngevity Pain Cream     cholecalciferol (VITAMIN D3) 10 MCG (400 UNIT) TABS tablet Take 1 tablet (400 Units total) by mouth daily. (Patient not taking: Reported on 12/12/2022) 30 tablet 0     ROS: Constitutional: No fever or chills Vision: No changes in vision ENT: No difficulty swallowing CV: No chest pain Pulm: No SOB or wheezing GI: No nausea or vomiting GU: No urgency or inability to hold urine Skin: No poor wound healing Neurologic: No numbness or tingling Psychiatric: No depression or anxiety Heme: No bruising Allergic: No reaction to medications or food   Exam: Blood pressure 96/60, pulse 73, temperature 98.7 F (37.1 C), temperature source Oral, resp. rate 16, SpO2 100 %. General: No acute distress Orientation: Drowsy but awakens and responds Mood and Affect: Cooperative and pleasant Gait: Unable to assess due to her fracture Coordination and balance: Within normal limits  Right lower extremity: Knee immobilizer is in place.  The leg is shortened and externally rotated.  Unable to tolerate any range of motion.  Lateral and sensory function intact to the right lower extremity.  Compartments are soft compressible.  No skin wounds or open lesions on her leg.  No deformity through the lower portion of her leg.  Left  lower extremity: Skin without lesions. No tenderness to palpation. Full painless ROM, full strength in each muscle groups without evidence of instability.   Medical Decision Making: Data: Imaging: X-rays and CT scan of the right knee are reviewed which shows a extra-articular distal femur fracture.  There is what appears to be a calcific nodule within the knee joint.  This appears benign in nature.  There is tricompartmental knee arthritis within the knee.  Labs:  Results for orders placed or performed during the hospital encounter of 12/11/22 (from the past 24 hour(s))  TSH     Status: Abnormal   Collection Time: 12/12/22  7:31 AM  Result Value Ref Range   TSH 0.304 (L) 0.350 - 4.500 uIU/mL  VITAMIN D 25 Hydroxy (  Vit-D Deficiency, Fractures)     Status: Abnormal   Collection Time: 12/12/22  7:31 AM  Result Value Ref Range   Vit D, 25-Hydroxy 24.93 (L) 30 - 100 ng/mL  Surgical PCR screen     Status: None   Collection Time: 12/12/22 10:50 PM   Specimen: Nasal Mucosa; Nasal Swab  Result Value Ref Range   MRSA, PCR NEGATIVE NEGATIVE   Staphylococcus aureus NEGATIVE NEGATIVE  Basic metabolic panel     Status: Abnormal   Collection Time: 12/13/22 12:11 AM  Result Value Ref Range   Sodium 147 (H) 135 - 145 mmol/L   Potassium 4.3 3.5 - 5.1 mmol/L   Chloride 113 (H) 98 - 111 mmol/L   CO2 23 22 - 32 mmol/L   Glucose, Bld 66 (L) 70 - 99 mg/dL   BUN 22 8 - 23 mg/dL   Creatinine, Ser 0.96 0.44 - 1.00 mg/dL   Calcium 9.5 8.9 - 10.3 mg/dL   GFR, Estimated 57 (L) >60 mL/min   Anion gap 11 5 - 15  CBC     Status: Abnormal   Collection Time: 12/13/22 12:11 AM  Result Value Ref Range   WBC 10.0 4.0 - 10.5 K/uL   RBC 3.58 (L) 3.87 - 5.11 MIL/uL   Hemoglobin 9.3 (L) 12.0 - 15.0 g/dL   HCT 30.6 (L) 36.0 - 46.0 %   MCV 85.5 80.0 - 100.0 fL   MCH 26.0 26.0 - 34.0 pg   MCHC 30.4 30.0 - 36.0 g/dL   RDW 16.2 (H) 11.5 - 15.5 %   Platelets 164 150 - 400 K/uL   nRBC 0.0 0.0 - 0.2 %  Magnesium      Status: None   Collection Time: 12/13/22 12:11 AM  Result Value Ref Range   Magnesium 2.2 1.7 - 2.4 mg/dL  T4, free     Status: Abnormal   Collection Time: 12/13/22 12:11 AM  Result Value Ref Range   Free T4 1.38 (H) 0.61 - 1.12 ng/dL     Imaging or Labs ordered: None  Medical history and chart was reviewed and case discussed with medical provider.  Assessment/Plan: 89 year old female with a right distal femur fracture.  Due to the unstable nature of her injury and debilitating nature of these injuries I recommend proceeding with open reduction internal fixation.  I discussed risks and benefits of surgical intervention with the patient and her daughter.  I discussed the risk of nonoperative management including inability to ambulate risk of developing multiple complications including debility, bedsores, pneumonia, and UTIs.  I discussed the risks of surgery including bleeding infection, malunion, nonunion, hardware failure, hardware irritation, nerve or blood vessel injury, knee stiffness, DVT, even the possibility anesthetic complications.  I discussed with her that in my opinion she would not be able to ambulate with nonoperative management and that she would likely be debilitated for the remainder of her life.  After full discussion the patient and her daughter wish to proceed with surgery.  Consent will be obtained.  Shona Needles, MD [facility] [phone] (office) [email or web address]

## 2022-12-13 NOTE — Anesthesia Procedure Notes (Signed)
Procedure Name: Intubation Date/Time: 12/13/2022 8:13 AM  Performed by: Mariea Clonts, CRNAPre-anesthesia Checklist: Patient identified, Emergency Drugs available, Suction available and Patient being monitored Patient Re-evaluated:Patient Re-evaluated prior to induction Oxygen Delivery Method: Circle System Utilized Preoxygenation: Pre-oxygenation with 100% oxygen Induction Type: IV induction Ventilation: Mask ventilation without difficulty Laryngoscope Size: Miller and 2 Grade View: Grade I Tube type: Oral Tube size: 7.0 mm Number of attempts: 1 Airway Equipment and Method: Stylet and Oral airway Placement Confirmation: ETT inserted through vocal cords under direct vision, positive ETCO2 and breath sounds checked- equal and bilateral Tube secured with: Tape Dental Injury: Teeth and Oropharynx as per pre-operative assessment

## 2022-12-13 NOTE — TOC CAGE-AID Note (Signed)
Transition of Care [facility]) - CAGE-AID Screening   Patient Details  Name: Tracy Faulkner MRN: [id number] Date of Birth: [birth year]  Transition of Care [facility]) CM/SW Contact:    Bethann Berkshire, [place] Phone Number: 12/13/2022, 10:17 AM   CAGE-AID Screening: Substance Abuse Screening unable to be completed due to: : Patient unable to participate

## 2022-12-13 NOTE — Progress Notes (Signed)
PROGRESS NOTE    Tracy Faulkner  FGH:[id number] DOB: 23-May-1932 DOA: 12/11/2022 PCP: Sandrea Hughs, NP   Brief Narrative:  89 year old with history of HTN, remote breast cancer comes to the hospital with complaints of weakness and fall when trying to get out of car.  Patient was found to have communicated diametaphyseal distal femoral fracture with impaction at the femoral site.  Orthopedic consulted, and underwent ORIF on 1/15.   Assessment & Plan:  Principal Problem:   Femoral fracture (HCC) Active Problems:   Hypertension   Hx of breast cancer   MCI (mild cognitive impairment)   Chronic kidney disease, stage 3a (HCC)   DNR (do not resuscitate)   Fall at home, initial encounter   Thrombocytopenia ([place])   Anemia due to chronic kidney disease   Femur fracture, right ([place])    Right femoral fracture, close Mechanical fall -Post OP today; ORIF 1/15.  PT/OT.  Postop pain control, dressing changes, follow-up care, PT/OT precautions and DVT prophylaxis management per orthopedic. -CT head and cervical spine are negative  Thrombocytopenia - Platelets stable will monitor  Anemia of chronic disease - Hemoglobin stable at 8.6, will monitor  Essential hypertension - Losartan 50 mg daily.  IV as needed  Mild cognitive impairment - Supportive care  Vitamin D deficiency - Supplement started    DVT prophylaxis: Subcu heparin Code Status: DNR Family Communication: daugther at bedside  Status is: Inpatient Surgical plans tomorrow   Subjective: Drowsy no complaints.  Daughter at bedside  Examination: Constitutional: NAD Respiratory: Clear to auscultation bilaterally Cardiovascular: Normal sinus rhythm, no rubs Abdomen: Nontender nondistended good bowel sounds Musculoskeletal: No edema noted Skin: No rashes seen Neurologic: CN 2-12 grossly intact.  And nonfocal Psychiatric: drwosy Surgical site ok  Objective: Vitals:   12/13/22 0930 12/13/22 1000 12/13/22 1015  12/13/22 1049  BP: 121/71 (!) 140/76 138/77 137/80  Pulse: 72 70 67 70  Resp: '14 18 14 15  '$ Temp: 97.6 F (36.4 C)  97.6 F (36.4 C) (!) 97.5 F (36.4 C)  TempSrc:    Axillary  SpO2: 100% 100% 100% 100%    Intake/Output Summary (Last 24 hours) at 12/13/2022 1207 Last data filed at 12/13/2022 1000 Gross per 24 hour  Intake 1142.51 ml  Output 650 ml  Net 492.51 ml   There were no vitals filed for this visit.   Data Reviewed:   CBC: Recent Labs  Lab 12/12/22 0207 12/13/22 0011  WBC 8.8 10.0  NEUTROABS 7.6  --   HGB 8.6* 9.3*  HCT 27.1* 30.6*  MCV 80.9 85.5  PLT 141* 678   Basic Metabolic Panel: Recent Labs  Lab 12/12/22 0207 12/12/22 0552 12/13/22 0011  NA 145  --  147*  K 3.9  --  4.3  CL 115*  --  113*  CO2 22  --  23  GLUCOSE 143*  --  66*  BUN 22  --  22  CREATININE 0.94  --  0.96  CALCIUM 9.1  --  9.5  MG  --  2.0 2.2   GFR: CrCl cannot be calculated (Unknown ideal weight.). Liver Function Tests: Recent Labs  Lab 12/12/22 0207  AST 30  ALT 24  ALKPHOS 55  BILITOT 0.4  PROT 5.8*  ALBUMIN 3.0*   No results for input(s): "LIPASE", "AMYLASE" in the last 168 hours. No results for input(s): "AMMONIA" in the last 168 hours. Coagulation Profile: Recent Labs  Lab 12/12/22 0552  INR 1.3*   Cardiac Enzymes: No  results for input(s): "CKTOTAL", "CKMB", "CKMBINDEX", "TROPONINI" in the last 168 hours. BNP (last 3 results) No results for input(s): "PROBNP" in the last 8760 hours. HbA1C: No results for input(s): "HGBA1C" in the last 72 hours. CBG: Recent Labs  Lab 12/13/22 0753  GLUCAP 116*   Lipid Profile: No results for input(s): "CHOL", "HDL", "LDLCALC", "TRIG", "CHOLHDL", "LDLDIRECT" in the last 72 hours. Thyroid Function Tests: Recent Labs    12/12/22 0731 12/13/22 0011  TSH 0.304*  --   FREET4  --  1.38*   Anemia Panel: No results for input(s): "VITAMINB12", "FOLATE", "FERRITIN", "TIBC", "IRON", "RETICCTPCT" in the last 72  hours. Sepsis Labs: No results for input(s): "PROCALCITON", "LATICACIDVEN" in the last 168 hours.  Recent Results (from the past 240 hour(s))  Surgical PCR screen     Status: None   Collection Time: 12/12/22 10:50 PM   Specimen: Nasal Mucosa; Nasal Swab  Result Value Ref Range Status   MRSA, PCR NEGATIVE NEGATIVE Final   Staphylococcus aureus NEGATIVE NEGATIVE Final    Comment: (NOTE) The Xpert SA Assay (FDA approved for NASAL specimens in patients 70 years of age and older), is one component of a comprehensive surveillance program. It is not intended to diagnose infection nor to guide or monitor treatment. Performed at [facility], [place] [address], [place], [place] [address]          Radiology Studies: DG Knee Right Port  Result Date: 12/13/2022 CLINICAL DATA:  Postop EXAM: PORTABLE RIGHT KNEE - 1-2 VIEW COMPARISON:  CT from earlier the same day FINDINGS: Lateral plate and screw fixation of distal femoral shaft fracture, major fragments in near anatomic alignment. No evident complication. Moderate tricompartmental DJD as before. 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 IMPRESSION: Lateral plate and screw fixation of distal femoral shaft fracture, major fragments in near anatomic alignment. Electronically Signed   By: Lucrezia Europe M.D.   On: 12/13/2022 10:20   DG FEMUR, MIN 2 VIEWS RIGHT  Result Date: 12/13/2022 CLINICAL DATA:  ORIF distal right femur fracture. EXAM: RIGHT FEMUR 2 VIEWS; DG C-ARM 1-60 MIN-NO REPORT COMPARISON:  12/11/2022; right knee CT-12/11/2022 FLUOROSCOPY TIME: FLUOROSCOPY TIME 30 seconds (2.1 mGy) FINDINGS: Six spot intraoperative fluoroscopic images during sideplate fixation comminuted right distal femur fracture are provided for review. Completion images demonstrate marked improved alignment of known distal comminuted fracture. There is expected subcutaneous emphysema about the operative site.  No radiopaque foreign body. Redemonstrated calcified loose body within the superior patellofemoral joint space. Suspected moderate to severe tricompartmental degenerative change of the knee, incompletely evaluated. IMPRESSION: Post ORIF of comminuted right distal femur fracture without evidence of complication. Electronically Signed   By: Sandi Mariscal M.D.   On: 12/13/2022 09:19   DG C-Arm 1-60 Min-No Report  Result Date: 12/13/2022 Fluoroscopy was utilized by the requesting physician.  No radiographic interpretation.   CT Knee Right Wo Contrast  Result Date: 12/12/2022 CLINICAL DATA:  Distal right femoral fracture with comminution. EXAM: CT OF THE RIGHT KNEE WITHOUT CONTRAST TECHNIQUE: Multidetector CT imaging of the right knee was performed according to the standard protocol. Multiplanar CT image reconstructions were also generated. RADIATION DOSE REDUCTION: This exam was performed according to the departmental dose-optimization program which includes automated exposure control, adjustment of the mA and/or kV according to patient size and/or use of iterative reconstruction technique. COMPARISON:  Right knee and femoral series earlier today. FINDINGS: Bones/Joint/Cartilage Diffuse osteopenia. Again noted is a comminuted diametaphyseal distal femoral fracture  with impaction at the fracture site. There is a butterfly comminution fragment of the medial aspect of the distal fragment of the distal femoral metaphysis, slightly displaced and additional smaller comminution fragments between fracture margins. The main distal fragment is displaced posteriorly up to 1/2 of a shaft width, maybe only approximately 1/3 of the shaft width, with mild angulation towards the medial aspect and moderate dorsal angulation of the distal fragment. The fracture does not extend into the condyles. There has no proximal tibial or fibular fracture. Background advanced tricompartmental degenerative arthrosis with bulky marginal  osteophytes and with juxta-articular sclerosis and microcystic change along the femorotibial articulation. There is mild spurring of the proximal tibiofibular joint. There is a moderate heterogeneous hemarthrosis, most of which is suprapatellar. There is a 2.9 cm popcorn like calcification in the lateral gutter of the suprapatellar bursal space, additional small subcentimeter loose bodies medially, findings consistent with synovial osteochondromatosis. Ligaments Suboptimally assessed by CT. Muscles and Tendons Not well seen with this technique. The extensor mechanism is grossly intact. Partial fatty atrophy in the dorsal thigh compartment is noted, mild atrophy in the foreleg musculature. No visible intramuscular hematoma or soft tissue gas. Soft tissues Diffuse superficial edema. No soft tissue gas is seen. No soft tissue mass. Other: Partially visible left knee arthroplasty. IMPRESSION: 1. Comminuted diametaphyseal distal femoral fracture with impaction at the fracture site, displacement and angulation as described above. 2. Moderate heterogeneous hemarthrosis. 3. Osteopenia and degenerative change. 4. Diffuse superficial edema. 5. 2.9 cm popcorn like calcification in the lateral gutter of the suprapatellar bursal space, additional small subcentimeter loose bodies medially, findings consistent with synovial osteochondromatosis. Electronically Signed   By: Telford Nab M.D.   On: 12/12/2022 00:15   DG Femur Min 2 Views Right  Result Date: 12/11/2022 CLINICAL DATA:  [place], right leg tenderness EXAM: RIGHT FEMUR 2 VIEWS; RIGHT KNEE - 1-2 VIEW; DG HIP (WITH OR WITHOUT PELVIS) 2-3V RIGHT COMPARISON:  None Available. FINDINGS: Right hip: Frontal view of the pelvis as well as frontal and cross-table lateral views of the right hip are obtained. Patient is rotated to the right during imaging. There are no acute displaced fractures. The hips are well aligned. Mild symmetrical bilateral hip osteoarthritis. Calcification  within the pelvis consistent with calcified fibroid. Right femur: Frontal and cross-table lateral views of the right femur are obtained. There is a comminuted impacted fracture of the distal right femoral metadiaphyseal junction. Ventral angulation at the fracture site. Overlying soft tissue swelling. Remaining portions of the right femur are unremarkable. Right knee: Frontal and cross-table lateral views are obtained. There is a comminuted impacted fracture of the distal right femoral metadiaphyseal junction, with ventral angulation at the fracture site. Fracture does not appear to extend into the femoral condyles or joint space of the right knee. There is severe osteoarthritis. Synovial osteochondromatosis within the suprapatellar region. Diffuse soft tissue swelling. IMPRESSION: 1. Comminuted impacted fracture of the distal right femoral metadiaphyseal junction. 2. Severe soft tissue swelling surrounding the distal femur and right knee. 3. No other acute bony abnormalities. 4. 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 Electronically Signed   By: Randa Ngo M.D.   On: 12/11/2022 22:50   DG Hip Unilat W or Wo Pelvis 2-3 Views Right  Result Date: 12/11/2022 CLINICAL DATA:  [place], right leg tenderness EXAM: RIGHT FEMUR 2 VIEWS; RIGHT KNEE - 1-2 VIEW; DG HIP (WITH OR WITHOUT PELVIS) 2-3V RIGHT COMPARISON:  None Available. FINDINGS: Right hip: Frontal view of the pelvis  as well as frontal and cross-table lateral views of the right hip are obtained. Patient is rotated to the right during imaging. There are no acute displaced fractures. The hips are well aligned. Mild symmetrical bilateral hip osteoarthritis. Calcification within the pelvis consistent with calcified fibroid. Right femur: Frontal and cross-table lateral views of the right femur are obtained. There is a comminuted impacted fracture of the distal right femoral metadiaphyseal junction. Ventral angulation at the fracture site. Overlying soft  tissue swelling. Remaining portions of the right femur are unremarkable. Right knee: Frontal and cross-table lateral views are obtained. There is a comminuted impacted fracture of the distal right femoral metadiaphyseal junction, with ventral angulation at the fracture site. Fracture does not appear to extend into the femoral condyles or joint space of the right knee. There is severe osteoarthritis. Synovial osteochondromatosis within the suprapatellar region. Diffuse soft tissue swelling. IMPRESSION: 1. Comminuted impacted fracture of the distal right femoral metadiaphyseal junction. 2. Severe soft tissue swelling surrounding the distal femur and right knee. 3. No other acute bony abnormalities. 4. 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 Electronically Signed   By: Randa Ngo M.D.   On: 12/11/2022 22:50   DG Knee 1-2 Views Right  Result Date: 12/11/2022 CLINICAL DATA:  [place], right leg tenderness EXAM: RIGHT FEMUR 2 VIEWS; RIGHT KNEE - 1-2 VIEW; DG HIP (WITH OR WITHOUT PELVIS) 2-3V RIGHT COMPARISON:  None Available. FINDINGS: Right hip: Frontal view of the pelvis as well as frontal and cross-table lateral views of the right hip are obtained. Patient is rotated to the right during imaging. There are no acute displaced fractures. The hips are well aligned. Mild symmetrical bilateral hip osteoarthritis. Calcification within the pelvis consistent with calcified fibroid. Right femur: Frontal and cross-table lateral views of the right femur are obtained. There is a comminuted impacted fracture of the distal right femoral metadiaphyseal junction. Ventral angulation at the fracture site. Overlying soft tissue swelling. Remaining portions of the right femur are unremarkable. Right knee: Frontal and cross-table lateral views are obtained. There is a comminuted impacted fracture of the distal right femoral metadiaphyseal junction, with ventral angulation at the fracture site. Fracture does not appear to  extend into the femoral condyles or joint space of the right knee. There is severe osteoarthritis. Synovial osteochondromatosis within the suprapatellar region. Diffuse soft tissue swelling. IMPRESSION: 1. Comminuted impacted fracture of the distal right femoral metadiaphyseal junction. 2. Severe soft tissue swelling surrounding the distal femur and right knee. 3. No other acute bony abnormalities. 4. 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 Electronically Signed   By: Randa Ngo M.D.   On: 12/11/2022 22:50   CT Cervical Spine Wo Contrast  Result Date: 12/11/2022 CLINICAL DATA:  [place], right knee pain EXAM: CT CERVICAL SPINE WITHOUT CONTRAST TECHNIQUE: Multidetector CT imaging of the cervical spine was performed without intravenous contrast. Multiplanar CT image reconstructions were also generated. RADIATION DOSE REDUCTION: This exam was performed according to the departmental dose-optimization program which includes automated exposure control, adjustment of the mA and/or kV according to patient size and/or use of iterative reconstruction technique. COMPARISON:  None Available. FINDINGS: Alignment: Straightening of the cervical spine due to extensive spondylosis from C3 through C5. Otherwise alignment is anatomic. Skull base and vertebrae: No acute fracture. No primary bone lesion or focal pathologic process. Soft tissues and spinal canal: No prevertebral fluid or swelling. No visible canal hematoma. Disc levels: There is bony fusion across the disc spaces and facet joints from  C3 through C5. Prominent spondylosis at C5-6 and C6-7. Upper chest: Airway is patent.  Lung apices are clear. Other: Reconstructed images demonstrate no additional findings. IMPRESSION: 1. No acute cervical spine fracture. 2. Extensive multilevel spondylosis and facet hypertrophy. Electronically Signed   By: Randa Ngo M.D.   On: 12/11/2022 22:20   CT Head Wo Contrast  Result Date: 12/11/2022 CLINICAL DATA:  [place],  right knee pain EXAM: CT HEAD WITHOUT CONTRAST TECHNIQUE: Contiguous axial images were obtained from the base of the skull through the vertex without intravenous contrast. RADIATION DOSE REDUCTION: This exam was performed according to the departmental dose-optimization program which includes automated exposure control, adjustment of the mA and/or kV according to patient size and/or use of iterative reconstruction technique. COMPARISON:  07/30/2022 FINDINGS: Brain: No acute infarct or hemorrhage. Lateral ventricles and midline structures are unremarkable. No acute extra-axial fluid collections. No mass effect. Vascular: No hyperdense vessel or unexpected calcification. Skull: Normal. Negative for fracture or focal lesion. Sinuses/Orbits: Mild ethmoid sinus mucosal thickening. Remaining paranasal sinuses are clear. Other: None. IMPRESSION: 1. No acute intracranial process. Electronically Signed   By: Randa Ngo M.D.   On: 12/11/2022 22:18        Scheduled Meds:  acetaminophen  650 mg Oral Q6H   cholecalciferol  1,000 Units Oral Daily   cycloSPORINE  1 drop Both Eyes Daily   docusate sodium  100 mg Oral BID   [START ON 12/14/2022] enoxaparin (LOVENOX) injection  40 mg Subcutaneous Q24H   losartan  50 mg Oral Daily   multivitamin with minerals  1 tablet Oral Daily   mupirocin ointment  1 Application Nasal BID   omega-3 acid ethyl esters  1 g Oral Daily   pantoprazole  40 mg Oral Daily   Continuous Infusions:  ceFAZolin      ceFAZolin (ANCEF) IV     dextrose 5 % and 0.45% NaCl 0  (12/13/22 0839)     LOS: 1 day   Time spent= 35 mins    Eulalia Ellerman Arsenio Loader, MD [facility]  If 7PM-7AM, please contact night-coverage  12/13/2022, 12:07 PM

## 2022-12-14 ENCOUNTER — Encounter (HOSPITAL_COMMUNITY): Payer: Self-pay | Admitting: Student

## 2022-12-14 DIAGNOSIS — S728X1D Other fracture of right femur, subsequent encounter for closed fracture with routine healing: Secondary | ICD-10-CM | POA: Diagnosis not present

## 2022-12-14 LAB — PREPARE RBC (CROSSMATCH)

## 2022-12-14 LAB — BASIC METABOLIC PANEL
Anion gap: 7 (ref 5–15)
BUN: 26 mg/dL — ABNORMAL HIGH (ref 8–23)
CO2: 24 mmol/L (ref 22–32)
Calcium: 8.2 mg/dL — ABNORMAL LOW (ref 8.9–10.3)
Chloride: 107 mmol/L (ref 98–111)
Creatinine, Ser: 1.17 mg/dL — ABNORMAL HIGH (ref 0.44–1.00)
GFR, Estimated: 45 mL/min — ABNORMAL LOW (ref >60)
Glucose, Bld: 93 mg/dL (ref 70–99)
Potassium: 4.1 mmol/L (ref 3.5–5.1)
Sodium: 138 mmol/L (ref 135–145)

## 2022-12-14 LAB — CBC
HCT: 17.4 % — ABNORMAL LOW (ref 36.0–46.0)
Hemoglobin: 5.6 g/dL — CL (ref 12.0–15.0)
MCH: 25.8 pg — ABNORMAL LOW (ref 26.0–34.0)
MCHC: 32.2 g/dL (ref 30.0–36.0)
MCV: 80.2 fL (ref 80.0–100.0)
Platelets: 105 10*3/uL — ABNORMAL LOW (ref 150–400)
RBC: 2.17 MIL/uL — ABNORMAL LOW (ref 3.87–5.11)
RDW: 16 % — ABNORMAL HIGH (ref 11.5–15.5)
WBC: 9.7 10*3/uL (ref 4.0–10.5)
nRBC: 0 % (ref 0.0–0.2)

## 2022-12-14 LAB — GLUCOSE, CAPILLARY: Glucose-Capillary: 100 mg/dL — ABNORMAL HIGH (ref 70–99)

## 2022-12-14 LAB — MAGNESIUM: Magnesium: 1.9 mg/dL (ref 1.7–2.4)

## 2022-12-14 MED ORDER — ENOXAPARIN SODIUM 40 MG/0.4ML IJ SOSY: 40.0000 mg | PREFILLED_SYRINGE | INTRAMUSCULAR | Status: DC

## 2022-12-14 MED ORDER — SODIUM CHLORIDE 0.9 % IV BOLUS
500.0000 mL | Freq: Once | INTRAVENOUS | Status: AC
  Administered 2022-12-14: 500 mL via INTRAVENOUS

## 2022-12-14 MED ORDER — OLANZAPINE 10 MG IM SOLR
5.0000 mg | Freq: Four times a day (QID) | INTRAMUSCULAR | Status: DC | PRN
  Administered 2022-12-14: 5 mg via INTRAMUSCULAR
  Filled 2022-12-14 (×2): qty 10

## 2022-12-14 MED ORDER — SODIUM CHLORIDE 0.9% IV SOLUTION: Freq: Once | INTRAVENOUS | Status: DC

## 2022-12-14 MED ORDER — STERILE WATER FOR INJECTION IJ SOLN
INTRAMUSCULAR | Status: AC
  Filled 2022-12-14: qty 10

## 2022-12-14 NOTE — Evaluation (Signed)
Physical Therapy Evaluation Patient Details Name: Tracy Faulkner MRN: [id number] DOB: [birth year] Today's Date: 12/14/2022  History of Present Illness  89 yo female admitted 1/13 with fall out of car with Rt femur fx. s/p ORIf 1/15. PMhx: HTN, breast CA, CKD, anemia  Clinical Impression  Pt with flat affect, restless and attempting to climb over rail on arrival. Pt with change in cognition from prior date per RN and at baseline lives with daughter although pt unable to provide PLOf and taken from prior admission. Pt with decreased strength, function, transfers, cognition and mobility who will benefit from acute therapy to maximize mobility and safety. Anticipate need for SNF with pt current function.        Recommendations for follow up therapy are one component of a multi-disciplinary discharge planning process, led by the attending physician.  Recommendations may be updated based on patient status, additional functional criteria and insurance authorization.  Follow Up Recommendations Skilled nursing-short term rehab (<3 hours/day) Can patient physically be transported by private vehicle: No    Assistance Recommended at Discharge Frequent or constant Supervision/Assistance  Patient can return home with the following  A lot of help with walking and/or transfers;A lot of help with bathing/dressing/bathroom;Direct supervision/assist for medications management;Assistance with cooking/housework;Assist for transportation;Help with stairs or ramp for entrance    Equipment Recommendations Wheelchair (measurements PT)  Recommendations for Other Services       Functional Status Assessment Patient has had a recent decline in their functional status and/or demonstrates limited ability to make significant improvements in function in a reasonable and predictable amount of time     Precautions / Restrictions Precautions Precautions: Fall Restrictions RLE Weight Bearing: Weight bearing as tolerated       Mobility  Bed Mobility Overal bed mobility: Needs Assistance Bed Mobility: Supine to Sit, Sit to Supine     Supine to sit: Min assist Sit to supine: Mod assist, +2 for safety/equipment   General bed mobility comments: min hand held assist, increased time and cues to rise from supine to sitting. REturn to bed with assist to lift legs, +2 for safety. With hand over hand assist to grasp head rail and in trendelenburg pt able to slide to [facility] with min assist    Transfers Overall transfer level: Needs assistance   Transfers: Sit to/from Stand Sit to Stand: Mod assist           General transfer comment: mod assist to rise from bed with left knee blocked and use of belt, pt repeatedly attempting to stand with 2 periods of clearing sacrum but could not fully extend hips and trunk in standing with return to bed, unable to weight shift    Ambulation/Gait                  Stairs            Wheelchair Mobility    Modified Rankin (Stroke Patients Only)       Balance Overall balance assessment: Needs assistance   Sitting balance-Leahy Scale: Fair Sitting balance - Comments: static sitting with guarding   Standing balance support: Bilateral upper extremity supported Standing balance-Leahy Scale: Poor Standing balance comment: mod physical assist                             Pertinent Vitals/Pain Pain Assessment Pain Assessment: No/denies pain    Home Living Family/patient expects to be discharged to:: Private residence Living Arrangements: Children;Other  relatives Available Help at Discharge: Family;Available 24 hours/day Type of Home: House Home Access: Stairs to enter   [employer] of Steps: 1   Home Layout: One level Home Equipment: BSC/3in1;Shower seat;Wheelchair Probation officer (4 wheels);Rolling Walker (2 wheels);Cane - single point Additional Comments: pt unable to provide and home setup and PLOF taken from prior  admission, no family present    Prior Function               Mobility Comments: Uses cane at baseline, household distance ADLs Comments: Pt independent with feeding, bathing (set up assist), and dressing. family does IADLs     Hand Dominance        Extremity/Trunk Assessment   Upper Extremity Assessment Upper Extremity Assessment: Generalized weakness;Difficult to assess due to impaired cognition    Lower Extremity Assessment Lower Extremity Assessment: Generalized weakness;Difficult to assess due to impaired cognition    Cervical / Trunk Assessment Cervical / Trunk Assessment: Kyphotic  Communication      Cognition Arousal/Alertness: Awake/alert Behavior During Therapy: Flat affect, Restless Overall Cognitive Status: Impaired/Different from baseline Area of Impairment: Orientation, Attention, Memory, Following commands, Safety/judgement                 Orientation Level: Disoriented to, Time, Situation, Place Current Attention Level: Focused           General Comments: pt responding to name, required increased time and multimodal cues to direct movement. Pt very restless trying to climb over railing on arrival with pt having dismantled her operative dressing. Pt unable to respond verbally to questions. Per RN was oriented x 1 and following commands prior to this date        General Comments      Exercises     Assessment/Plan    PT Assessment Patient needs continued PT services  PT Problem List Decreased strength;Decreased mobility;Decreased range of motion;Decreased coordination;Decreased cognition;Decreased activity tolerance;Decreased balance       PT Treatment Interventions DME instruction;Therapeutic activities;Cognitive remediation;Therapeutic exercise;Gait training;Patient/family education;Balance training;Functional mobility training    PT Goals (Current goals can be found in the Care Plan section)  Acute Rehab PT Goals PT Goal  Formulation: Patient unable to participate in goal setting Time For Goal Achievement: 12/28/22 Potential to Achieve Goals: Fair    Frequency Min 2X/week     Co-evaluation               AM-PAC PT "6 Clicks" Mobility  Outcome Measure Help needed turning from your back to your side while in a flat bed without using bedrails?: A Little Help needed moving from lying on your back to sitting on the side of a flat bed without using bedrails?: A Lot Help needed moving to and from a bed to a chair (including a wheelchair)?: Total Help needed standing up from a chair using your arms (e.g., wheelchair or bedside chair)?: A Lot Help needed to walk in hospital room?: Total Help needed climbing 3-5 steps with a railing? : Total 6 Click Score: 10    End of Session Equipment Utilized During Treatment: Gait belt Activity Tolerance: Patient tolerated treatment well Patient left: in bed;with call bell/phone within reach;with bed alarm set Nurse Communication: Mobility status PT Visit Diagnosis: Other abnormalities of gait and mobility (R26.89);Muscle weakness (generalized) (M62.81);History of falling (Z91.81)    Time: 5462-7035 PT Time Calculation (min) (ACUTE ONLY): 13 min   Charges:   PT Evaluation $PT Eval Moderate Complexity: 1 Mod  [place] [facility] Office: [place] 12/14/2022, 8:56 AM

## 2022-12-14 NOTE — Progress Notes (Signed)
RN assessed patient this morning. Patient having increased agitation and trying to climb out of bed. Patient reorientated and repositioned. Bed alarm and bed at lowest position. Vitals are a little low but stable this morning. Dr. Reesa Chew made aware.

## 2022-12-14 NOTE — Progress Notes (Signed)
Orthopaedic Trauma Progress Note  SUBJECTIVE: Noted to be agitated earlier this morning, trying to get out of bed. Was able to be re-oriented and repositioned by nursing. Sitting up in bed now, nursing at bedside. Patient denies any significant pain.  OBJECTIVE:  Vitals:   12/14/22 0146 12/14/22 0723  BP: 99/61 101/66  Pulse: 76 (!) 104  Resp:  16  Temp:  98.7 F (37.1 C)  SpO2: 100% 100%    General: Sitting up in bed, NAD Respiratory: No increased work of breathing.  RLE: Ace wrap in place. One of the mepilex dressing to the thigh had been removed, new dressing applied. Compartments soft and compressible. Able to wiggle toes a small amount. 2+ DP pulse  IMAGING: Stable post op imaging.   LABS:  Results for orders placed or performed during the hospital encounter of 12/11/22 (from the past 24 hour(s))  CBC     Status: Abnormal   Collection Time: 12/14/22  6:57 AM  Result Value Ref Range   WBC 9.7 4.0 - 10.5 K/uL   RBC 2.17 (L) 3.87 - 5.11 MIL/uL   Hemoglobin 5.6 (LL) 12.0 - 15.0 g/dL   HCT 17.4 (L) 36.0 - 46.0 %   MCV 80.2 80.0 - 100.0 fL   MCH 25.8 (L) 26.0 - 34.0 pg   MCHC 32.2 30.0 - 36.0 g/dL   RDW 16.0 (H) 11.5 - 15.5 %   Platelets 105 (L) 150 - 400 K/uL   nRBC 0.0 0.0 - 0.2 %    ASSESSMENT: Tracy Faulkner is a 89 y.o. female, 1 Day Post-Op s/p OPEN REDUCTION INTERNAL FIXATION RIGHT DISTAL FEMUR FRACTURE  CV/Blood loss: Acute blood loss anemia, Hgb 5.6 this AM. 2 units ordered for transfusion. BP slightly low but stable.   PLAN: Weightbearing: WBAT RLE ROM:  ok for unrestricted ROM  Incisional and dressing care: Reinforce dressings as needed  Showering: Hold off for now Orthopedic device(s): None  Pain management:  1. Tylenol 650 mg q 6 hours PRN 2. Oxycodone 5 mg q 4 hours PRN 3. Morphine 0.5-1 mg q 2 hours PRN VTE prophylaxis: Hold Lovenox today due to low Hgb, SCDs ID:  Vancomycin post op Foley/Lines: Foley removed this AM. KVO IVFs Impediments to  Fracture Healing: Vit D level 24, started on D3 supplementation Dispo: PT/OT eval today, will likely require SNF.  Continue to monitor CBC. Plan to remove/change dressing RLE tomorrow 12/15/22  D/C recommendations: - Oxycodone 5 mg and Tylenol PRN for pain control - Eliquis 2.5 mg BID x 30 days for DVT prophylaxis - Continue 1000 units daily Vit D supplementation x 90 days  Follow - up plan: 2 weeks   Contact information:  Katha Hamming MD, Rushie Nyhan PA-C. After hours and holidays please check [email or web address] for group call information for [facility]   Gwinda Passe, PA-C [phone] (office) [email or web address]

## 2022-12-14 NOTE — Progress Notes (Signed)
PROGRESS NOTE    Tracy Faulkner  JJO:[id number] DOB: 05-Jul-1932 DOA: 12/11/2022 PCP: Sandrea Hughs, NP   Brief Narrative:  89 year old with history of HTN, remote breast cancer comes to the hospital with complaints of weakness and fall when trying to get out of car.  Patient was found to have communicated diametaphyseal distal femoral fracture with impaction at the femoral site.  Orthopedic consulted, and underwent ORIF on 1/15.  Postop developed blood loss anemia requiring PRBC transfusion.  Assessment & Plan:  Principal Problem:   Femoral fracture (HCC) Active Problems:   Hypertension   Hx of breast cancer   MCI (mild cognitive impairment)   Chronic kidney disease, stage 3a (HCC)   DNR (do not resuscitate)   Fall at home, initial encounter   Thrombocytopenia ([place])   Anemia due to chronic kidney disease   Femur fracture, right ([place])    Right femoral fracture, close Mechanical fall -Post OP today; ORIF 1/15.  PT OT recommending SNF. -CT head and cervical spine are negative  Thrombocytopenia - Platelets stable will monitor  Anemia of chronic disease Acute blood loss anemia -Baseline hemoglobin 8.5, this morning dropped a little bit below 6 therefore will order 2 unit PRBC.  Essential hypertension - Losartan 50 mg daily.  IV as needed  Mild cognitive impairment Intermittent agitation - Supportive care.  As needed Zyprexa  Vitamin D deficiency - Supplement started    DVT prophylaxis: Subcu heparin Code Status: DNR Family Communication: Daughter updated  Status is: Inpatient Ongoing management for blood loss anemia, agitation.  Hopefully SNF in the next 1-2 days   Subjective: Patient was resting well all day yesterday but overnight had episodes of agitation.  This morning she received Zyprexa.  No obvious signs of blood loss but hemoglobin did drop.  Patient and family agreeable for blood transfusion.  Examination: Constitutional: Not in acute distress,  slightly agitated Respiratory: Clear to auscultation bilaterally Cardiovascular: Normal sinus rhythm, no rubs Abdomen: Nontender nondistended good bowel sounds Musculoskeletal: No edema noted Skin: No rashes seen Neurologic: CN 2-12 grossly intact.  And nonfocal Psychiatric: Drowsy.  Poor judgment and insight.  Alert to name only Surgical site ok  Objective: Vitals:   12/14/22 0017 12/14/22 0117 12/14/22 0146 12/14/22 0723  BP: 101/68 (!) 89/64 99/61 101/66  Pulse: 86 74 76 (!) 104  Resp:  16  16  Temp:    98.7 F (37.1 C)  TempSrc:    Oral  SpO2:  100% 100% 100%    Intake/Output Summary (Last 24 hours) at 12/14/2022 1054 Last data filed at 12/14/2022 0353 Gross per 24 hour  Intake --  Output 250 ml  Net -250 ml   There were no vitals filed for this visit.   Data Reviewed:   CBC: Recent Labs  Lab 12/12/22 0207 12/13/22 0011 12/14/22 0657  WBC 8.8 10.0 9.7  NEUTROABS 7.6  --   --   HGB 8.6* 9.3* 5.6*  HCT 27.1* 30.6* 17.4*  MCV 80.9 85.5 80.2  PLT 141* 164 160*   Basic Metabolic Panel: Recent Labs  Lab 12/12/22 0207 12/12/22 0552 12/13/22 0011 12/14/22 0657  NA 145  --  147* 138  K 3.9  --  4.3 4.1  CL 115*  --  113* 107  CO2 22  --  23 24  GLUCOSE 143*  --  66* 93  BUN 22  --  22 26*  CREATININE 0.94  --  0.96 1.17*  CALCIUM 9.1  --  9.5 8.2*  MG  --  2.0 2.2 1.9   GFR: CrCl cannot be calculated (Unknown ideal weight.). Liver Function Tests: Recent Labs  Lab 12/12/22 0207  AST 30  ALT 24  ALKPHOS 55  BILITOT 0.4  PROT 5.8*  ALBUMIN 3.0*   No results for input(s): "LIPASE", "AMYLASE" in the last 168 hours. No results for input(s): "AMMONIA" in the last 168 hours. Coagulation Profile: Recent Labs  Lab 12/12/22 0552  INR 1.3*   Cardiac Enzymes: No results for input(s): "CKTOTAL", "CKMB", "CKMBINDEX", "TROPONINI" in the last 168 hours. BNP (last 3 results) No results for input(s): "PROBNP" in the last 8760 hours. HbA1C: No results  for input(s): "HGBA1C" in the last 72 hours. CBG: Recent Labs  Lab 12/13/22 0753  GLUCAP 116*   Lipid Profile: No results for input(s): "CHOL", "HDL", "LDLCALC", "TRIG", "CHOLHDL", "LDLDIRECT" in the last 72 hours. Thyroid Function Tests: Recent Labs    12/12/22 0731 12/13/22 0011  TSH 0.304*  --   FREET4  --  1.38*   Anemia Panel: No results for input(s): "VITAMINB12", "FOLATE", "FERRITIN", "TIBC", "IRON", "RETICCTPCT" in the last 72 hours. Sepsis Labs: No results for input(s): "PROCALCITON", "LATICACIDVEN" in the last 168 hours.  Recent Results (from the past 240 hour(s))  Surgical PCR screen     Status: None   Collection Time: 12/12/22 10:50 PM   Specimen: Nasal Mucosa; Nasal Swab  Result Value Ref Range Status   MRSA, PCR NEGATIVE NEGATIVE Final   Staphylococcus aureus NEGATIVE NEGATIVE Final    Comment: (NOTE) The Xpert SA Assay (FDA approved for NASAL specimens in patients 3 years of age and older), is one component of a comprehensive surveillance program. It is not intended to diagnose infection nor to guide or monitor treatment. Performed at [facility], [place] [address], [place], [place] [address]          Radiology Studies: DG Knee Right Port  Result Date: 12/13/2022 CLINICAL DATA:  Postop EXAM: PORTABLE RIGHT KNEE - 1-2 VIEW COMPARISON:  CT from earlier the same day FINDINGS: Lateral plate and screw fixation of distal femoral shaft fracture, major fragments in near anatomic alignment. No evident complication. Moderate tricompartmental DJD as before. 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 IMPRESSION: Lateral plate and screw fixation of distal femoral shaft fracture, major fragments in near anatomic alignment. Electronically Signed   By: Lucrezia Europe M.D.   On: 12/13/2022 10:20   DG FEMUR, MIN 2 VIEWS RIGHT  Result Date: 12/13/2022 CLINICAL DATA:  ORIF distal right femur fracture. EXAM:  RIGHT FEMUR 2 VIEWS; DG C-ARM 1-60 MIN-NO REPORT COMPARISON:  12/11/2022; right knee CT-12/11/2022 FLUOROSCOPY TIME: FLUOROSCOPY TIME 30 seconds (2.1 mGy) FINDINGS: Six spot intraoperative fluoroscopic images during sideplate fixation comminuted right distal femur fracture are provided for review. Completion images demonstrate marked improved alignment of known distal comminuted fracture. There is expected subcutaneous emphysema about the operative site. No radiopaque foreign body. Redemonstrated calcified loose body within the superior patellofemoral joint space. Suspected moderate to severe tricompartmental degenerative change of the knee, incompletely evaluated. IMPRESSION: Post ORIF of comminuted right distal femur fracture without evidence of complication. Electronically Signed   By: Sandi Mariscal M.D.   On: 12/13/2022 09:19   DG C-Arm 1-60 Min-No Report  Result Date: 12/13/2022 Fluoroscopy was utilized by the requesting physician.  No radiographic interpretation.        Scheduled Meds:  sodium chloride   Intravenous Once   cholecalciferol  1,000 Units Oral Daily   cycloSPORINE  1 drop Both Eyes Daily   docusate sodium  100 mg Oral BID   [START ON 12/15/2022] enoxaparin (LOVENOX) injection  40 mg Subcutaneous Q24H   losartan  50 mg Oral Daily   multivitamin with minerals  1 tablet Oral Daily   mupirocin ointment  1 Application Nasal BID   omega-3 acid ethyl esters  1 g Oral Daily   pantoprazole  40 mg Oral Daily   sterile water (preservative free)       Continuous Infusions:     LOS: 2 days   Time spent= 35 mins    Vinisha Faxon Arsenio Loader, MD [facility]  If 7PM-7AM, please contact night-coverage  12/14/2022, 10:54 AM

## 2022-12-14 NOTE — NC FL2 (Signed)
[place] LEVEL OF CARE FORM     IDENTIFICATION  Patient Name: Tracy Faulkner Birthdate: 08-20-1932 Sex: female Admission Date (Current Location): 12/11/2022  [facility] and [place] Number:  Herbalist and Address:  [facility] [place]. [facility], [place] [address] [place], [place] [address]      Provider Number: [id number]  Attending Physician Name and Address:  Damita Lack, MD  Relative Name and Phone Number:  Cherrell Maybee, daughter - [phone]    Current Level of Care: Hospital Recommended Level of Care: [place] Prior Approval Number:    Date Approved/Denied:   PASRR Number: [id number]  Discharge Plan: SNF    Current Diagnoses: Patient Active Problem List   Diagnosis Date Noted   Femoral fracture ([place]) 12/12/2022   Fall at home, initial encounter 12/12/2022   Thrombocytopenia ([place]) 12/12/2022   Anemia due to chronic kidney disease 12/12/2022   Femur fracture, right ([place]) 12/12/2022   Closed fracture of multiple pubic rami, initial encounter ([place]) 07/30/2022   MCI (mild cognitive impairment) 07/30/2022   Chronic kidney disease, stage 3a ([place]) 07/30/2022   DNR (do not resuscitate) 07/30/2022   Hx of breast cancer 03/12/2022   Hav (hallux abducto valgus), unspecified laterality 11/02/2021   Dyspnea    Iron deficiency anemia 10/05/2017   Vitamin D deficiency 10/05/2017   Degenerative arthritis of left knee 10/07/2016   Near syncope 12/31/2015   Chest discomfort 12/31/2015   Premature ventricular contractions 12/31/2015   Breast cancer ([place])    Disturbance, sleep    Arthritis    Anemia    Frequent headaches    Back problem    Bowel trouble    Carpal tunnel syndrome    Ataxia    Hypertension    Pain in both knees     Orientation RESPIRATION BLADDER Height & Weight     Self  Normal Incontinent Weight:   Height:     BEHAVIORAL SYMPTOMS/MOOD NEUROLOGICAL BOWEL NUTRITION STATUS      Incontinent Diet   AMBULATORY STATUS COMMUNICATION OF NEEDS Skin   Total Care Verbally Surgical wounds                       Personal Care Assistance Level of Assistance  Bathing, Feeding, Dressing Bathing Assistance: Maximum assistance Feeding assistance: Limited assistance Dressing Assistance: Maximum assistance     Functional Limitations Info  Sight, Hearing, Speech Sight Info: Adequate Hearing Info: Adequate Speech Info: Adequate    SPECIAL CARE FACTORS FREQUENCY  OT (By licensed OT), PT (By licensed PT)     PT Frequency: 3-5 x per week OT Frequency: 3-5 x per week            Contractures Contractures Info: Not present    Additional Factors Info  Psychotropic, Allergies, Code Status Code Status Info: DNR Allergies Info: Ace Inhibitors, Lortab, Remeron, Penicillin Psychotropic Info: Trazodone, Zyprexa         Current Medications (12/14/2022):  This is the current hospital active medication list Current Facility-Administered Medications  Medication Dose Route Frequency Provider Last Rate Last Admin   0.9 %  sodium chloride infusion (Manually program via Guardrails IV Fluids)   Intravenous Once Amin, Ankit Chirag, MD       acetaminophen (TYLENOL) tablet 650 mg  650 mg Oral Q6H PRN Corinne Ports, PA-C   650 mg at 12/14/22 0421   cholecalciferol (VITAMIN D3) 25 MCG (1000 UNIT) tablet 1,000 Units  1,000  Units Oral Daily Corinne Ports, PA-C   1,000 Units at 12/12/22 1231   cycloSPORINE (RESTASIS) 0.05 % ophthalmic emulsion 1 drop  1 drop Both Eyes Daily Corinne Ports, PA-C   1 drop at 12/13/22 1524   diphenhydrAMINE (BENADRYL) 12.5 MG/5ML elixir 12.5-25 mg  12.5-25 mg Oral Q4H PRN Corinne Ports, PA-C       docusate sodium (COLACE) capsule 100 mg  100 mg Oral BID Corinne Ports, PA-C       [START ON 12/15/2022] enoxaparin (LOVENOX) injection 40 mg  40 mg Subcutaneous Q24H McClung, Sarah A, PA-C       guaiFENesin (ROBITUSSIN) 100 MG/5ML liquid 5 mL  5 mL Oral Q4H PRN  Thereasa Solo, Sarah A, PA-C       hydrALAZINE (APRESOLINE) injection 10 mg  10 mg Intravenous Q4H PRN Corinne Ports, PA-C       ipratropium-albuterol (DUONEB) 0.5-2.5 (3) MG/3ML nebulizer solution 3 mL  3 mL Nebulization Q4H PRN McClung, Sarah A, PA-C       losartan (COZAAR) tablet 50 mg  50 mg Oral Daily Corinne Ports, PA-C   50 mg at 12/13/22 1524   metoCLOPramide (REGLAN) tablet 5-10 mg  5-10 mg Oral Q8H PRN Corinne Ports, PA-C       Or   metoCLOPramide (REGLAN) injection 5-10 mg  5-10 mg Intravenous Q8H PRN Corinne Ports, PA-C       metoprolol tartrate (LOPRESSOR) injection 5 mg  5 mg Intravenous Q4H PRN Corinne Ports, PA-C       morphine (PF) 2 MG/ML injection 0.5-1 mg  0.5-1 mg Intravenous Q2H PRN Corinne Ports, PA-C   0.5 mg at 12/14/22 0032   multivitamin with minerals tablet 1 tablet  1 tablet Oral Daily Corinne Ports, PA-C   1 tablet at 12/12/22 1004   mupirocin ointment (BACTROBAN) 2 % 1 Application  1 Application Nasal BID Corinne Ports, PA-C   1 Application at 41/93/79 2041   OLANZapine (ZYPREXA) injection 5 mg  5 mg Intramuscular Q6H PRN Damita Lack, MD   5 mg at 12/14/22 0240   omega-3 acid ethyl esters (LOVAZA) capsule 1 g  1 g Oral Daily Corinne Ports, PA-C   1 g at 12/12/22 1005   ondansetron (ZOFRAN) injection 4 mg  4 mg Intravenous Q6H PRN Corinne Ports, PA-C       ondansetron (ZOFRAN) tablet 4 mg  4 mg Oral Q6H PRN Rushie Nyhan A, PA-C       oxyCODONE (Oxy IR/ROXICODONE) immediate release tablet 5 mg  5 mg Oral Q4H PRN Corinne Ports, PA-C   5 mg at 12/14/22 0421   pantoprazole (PROTONIX) EC tablet 40 mg  40 mg Oral Daily Corinne Ports, PA-C   40 mg at 12/13/22 1525   polyethylene glycol (MIRALAX / GLYCOLAX) packet 17 g  17 g Oral Daily PRN Thereasa Solo, Sarah A, PA-C       senna-docusate (Senokot-S) tablet 1 tablet  1 tablet Oral QHS PRN Corinne Ports, PA-C       sterile water (preservative free) injection            traZODone (DESYREL)  tablet 50 mg  50 mg Oral QHS PRN Corinne Ports, PA-C   50 mg at 12/12/22 2104     Discharge Medications: Please see discharge summary for a list of discharge medications.  Relevant Imaging Results:  Relevant Lab Results:   Additional Information  SS# [id number]  Curlene Labrum, RN

## 2022-12-14 NOTE — TOC Progression Note (Signed)
Transition of Care [facility]) - Progression Note    Patient Details  Name: Tracy Faulkner MRN: [id number] Date of Birth: 10/03/1932  Transition of Care [facility]) CM/SW [place], RN Phone Number: 12/14/2022, 4:54 PM  Clinical Narrative:    Cm met with the daughter at the bedside and offered Medicare choice for SNF placement - the daughter chose [facility].  Perrin Smack, CM at [facility] was notified.  Insurance authorization was started through [employer].          Expected Discharge Plan and Services                                               Social Determinants of Health (SDOH) Interventions SDOH Screenings   Food Insecurity: No Food Insecurity (12/12/2022)  Housing: Low Risk  (12/12/2022)  Transportation Needs: No Transportation Needs (12/12/2022)  Utilities: Not At Risk (12/12/2022)  Depression (PHQ2-9): Low Risk  (09/02/2022)  Tobacco Use: Low Risk  (12/13/2022)    Readmission Risk Interventions     No data to display

## 2022-12-14 NOTE — Progress Notes (Signed)
OT Cancellation Note  Patient Details Name: Tracy Faulkner MRN: [id number] DOB: December 15, 1931   Cancelled Treatment:    Reason Eval/Treat Not Completed: Medical issues which prohibited therapy.  HGB 5.6 and patient with change in mental status.  Will hold and continue efforts as appropriate.    Mcihael Hinderman D Trebor Galdamez 12/14/2022, 12:16 PM 12/14/2022  RP, OTR/L  Acute Rehabilitation Services  Office:  [phone]

## 2022-12-14 NOTE — Anesthesia Postprocedure Evaluation (Signed)
Anesthesia Post Note  Patient: Tracy Faulkner  Procedure(s) Performed: OPEN REDUCTION INTERNAL FIXATION (ORIF) DISTAL FEMUR FRACTURE (Right)     Patient location during evaluation: PACU Anesthesia Type: General Level of consciousness: awake and alert Pain management: pain level controlled Vital Signs Assessment: post-procedure vital signs reviewed and stable Respiratory status: spontaneous breathing, nonlabored ventilation, respiratory function stable and patient connected to nasal cannula oxygen Cardiovascular status: blood pressure returned to baseline and stable Postop Assessment: no apparent nausea or vomiting Anesthetic complications: no  No notable events documented.  Last Vitals:  Vitals:   12/14/22 1100 12/14/22 1130  BP: 102/60 104/62  Pulse: 85 80  Resp: 19   Temp: 37.1 C 37.1 C  SpO2: 100% 95%    Last Pain:  Vitals:   12/14/22 1130  TempSrc: Axillary  PainSc:                  [name]

## 2022-12-15 DIAGNOSIS — Z7401 Bed confinement status: Secondary | ICD-10-CM | POA: Diagnosis not present

## 2022-12-15 DIAGNOSIS — N1831 Chronic kidney disease, stage 3a: Secondary | ICD-10-CM

## 2022-12-15 DIAGNOSIS — S72451D Displaced supracondylar fracture without intracondylar extension of lower end of right femur, subsequent encounter for closed fracture with routine healing: Secondary | ICD-10-CM | POA: Diagnosis not present

## 2022-12-15 DIAGNOSIS — S79929A Unspecified injury of unspecified thigh, initial encounter: Secondary | ICD-10-CM | POA: Diagnosis not present

## 2022-12-15 DIAGNOSIS — W19XXXA Unspecified fall, initial encounter: Secondary | ICD-10-CM | POA: Diagnosis not present

## 2022-12-15 DIAGNOSIS — S72491D Other fracture of lower end of right femur, subsequent encounter for closed fracture with routine healing: Secondary | ICD-10-CM | POA: Diagnosis not present

## 2022-12-15 DIAGNOSIS — Z4789 Encounter for other orthopedic aftercare: Secondary | ICD-10-CM | POA: Diagnosis not present

## 2022-12-15 DIAGNOSIS — I10 Essential (primary) hypertension: Secondary | ICD-10-CM | POA: Diagnosis not present

## 2022-12-15 DIAGNOSIS — Z9181 History of falling: Secondary | ICD-10-CM | POA: Diagnosis not present

## 2022-12-15 DIAGNOSIS — D696 Thrombocytopenia, unspecified: Secondary | ICD-10-CM

## 2022-12-15 DIAGNOSIS — Y92009 Unspecified place in unspecified non-institutional (private) residence as the place of occurrence of the external cause: Secondary | ICD-10-CM | POA: Diagnosis not present

## 2022-12-15 DIAGNOSIS — M6259 Muscle wasting and atrophy, not elsewhere classified, multiple sites: Secondary | ICD-10-CM | POA: Diagnosis not present

## 2022-12-15 DIAGNOSIS — M6281 Muscle weakness (generalized): Secondary | ICD-10-CM | POA: Diagnosis not present

## 2022-12-15 DIAGNOSIS — S72491S Other fracture of lower end of right femur, sequela: Secondary | ICD-10-CM | POA: Diagnosis not present

## 2022-12-15 DIAGNOSIS — R262 Difficulty in walking, not elsewhere classified: Secondary | ICD-10-CM | POA: Diagnosis not present

## 2022-12-15 DIAGNOSIS — S728X1D Other fracture of right femur, subsequent encounter for closed fracture with routine healing: Secondary | ICD-10-CM | POA: Diagnosis not present

## 2022-12-15 DIAGNOSIS — R2681 Unsteadiness on feet: Secondary | ICD-10-CM | POA: Diagnosis not present

## 2022-12-15 DIAGNOSIS — R41841 Cognitive communication deficit: Secondary | ICD-10-CM | POA: Diagnosis not present

## 2022-12-15 DIAGNOSIS — D631 Anemia in chronic kidney disease: Secondary | ICD-10-CM | POA: Diagnosis not present

## 2022-12-15 DIAGNOSIS — R279 Unspecified lack of coordination: Secondary | ICD-10-CM | POA: Diagnosis not present

## 2022-12-15 DIAGNOSIS — D649 Anemia, unspecified: Secondary | ICD-10-CM | POA: Diagnosis not present

## 2022-12-15 LAB — TYPE AND SCREEN
ABO/RH(D): O POS
Antibody Screen: NEGATIVE
Unit division: 0
Unit division: 0

## 2022-12-15 LAB — CBC
HCT: 23.9 % — ABNORMAL LOW (ref 36.0–46.0)
Hemoglobin: 7.9 g/dL — ABNORMAL LOW (ref 12.0–15.0)
MCH: 26.8 pg (ref 26.0–34.0)
MCHC: 33.1 g/dL (ref 30.0–36.0)
MCV: 81 fL (ref 80.0–100.0)
Platelets: 114 10*3/uL — ABNORMAL LOW (ref 150–400)
RBC: 2.95 MIL/uL — ABNORMAL LOW (ref 3.87–5.11)
RDW: 15.6 % — ABNORMAL HIGH (ref 11.5–15.5)
WBC: 7 10*3/uL (ref 4.0–10.5)
nRBC: 0 % (ref 0.0–0.2)

## 2022-12-15 LAB — BPAM RBC
Blood Product Expiration Date: 202402102359
Blood Product Expiration Date: 202402102359
ISSUE DATE / TIME: 202401161100
ISSUE DATE / TIME: 202401161529
Unit Type and Rh: 5100
Unit Type and Rh: 5100

## 2022-12-15 LAB — BASIC METABOLIC PANEL
Anion gap: 4 — ABNORMAL LOW (ref 5–15)
BUN: 23 mg/dL (ref 8–23)
CO2: 24 mmol/L (ref 22–32)
Calcium: 8.3 mg/dL — ABNORMAL LOW (ref 8.9–10.3)
Chloride: 111 mmol/L (ref 98–111)
Creatinine, Ser: 0.9 mg/dL (ref 0.44–1.00)
GFR, Estimated: >60 mL/min (ref >60)
Glucose, Bld: 81 mg/dL (ref 70–99)
Potassium: 3.7 mmol/L (ref 3.5–5.1)
Sodium: 139 mmol/L (ref 135–145)

## 2022-12-15 LAB — MAGNESIUM: Magnesium: 2 mg/dL (ref 1.7–2.4)

## 2022-12-15 MED ORDER — APIXABAN 2.5 MG PO TABS: 2.5000 mg | ORAL_TABLET | Freq: Two times a day (BID) | ORAL | 0 refills | Status: DC

## 2022-12-15 MED ORDER — VITAMIN D3 25 MCG PO TABS: 1000.0000 [IU] | ORAL_TABLET | Freq: Every day | ORAL | 0 refills | Status: AC

## 2022-12-15 MED ORDER — OXYCODONE HCL 5 MG PO TABS: 5.0000 mg | ORAL_TABLET | Freq: Four times a day (QID) | ORAL | 0 refills | Status: DC | PRN

## 2022-12-15 MED ORDER — FE FUM-VIT C-VIT B12-FA 460-60-0.01-1 MG PO CAPS
1.0000 | ORAL_CAPSULE | Freq: Every day | ORAL | Status: DC
  Administered 2022-12-15: 1 via ORAL
  Filled 2022-12-15: qty 1

## 2022-12-15 MED ORDER — DOCUSATE SODIUM 100 MG PO CAPS: 100.0000 mg | ORAL_CAPSULE | Freq: Two times a day (BID) | ORAL | 0 refills | Status: DC

## 2022-12-15 MED ORDER — FE FUM-VIT C-VIT B12-FA 460-60-0.01-1 MG PO CAPS: 1.0000 | ORAL_CAPSULE | Freq: Every day | ORAL | 0 refills | Status: DC

## 2022-12-15 MED ORDER — POLYETHYLENE GLYCOL 3350 17 G PO PACK: 17.0000 g | PACK | Freq: Every day | ORAL | 0 refills | Status: AC | PRN

## 2022-12-15 NOTE — Care Management Important Message (Signed)
Important Message  Patient Details  Name: Tracy Faulkner MRN: [id number] Date of Birth: 07/10/1932   Medicare Important Message Given:  Yes     [name] [name] 12/15/2022, 1:34 PM

## 2022-12-15 NOTE — Discharge Summary (Signed)
Physician Discharge Summary   Patient: Tracy Faulkner MRN: [id number] DOB: 1932/03/17  Admit date:     12/11/2022  Discharge date: 12/15/22  Discharge Physician: Lorella Nimrod   PCP: Sandrea Hughs, NP   Recommendations at discharge:  Please obtain CBC and BMP in 1 week Patient is being discharged on Eliquis for 30 days as DVT prophylaxis. Follow-up with orthopedic surgery Follow-up with primary care provider  Discharge Diagnoses: Principal Problem:   Femoral fracture ([place]) Active Problems:   Hypertension   Hx of breast cancer   MCI (mild cognitive impairment)   Chronic kidney disease, stage 3a ([place])   DNR (do not resuscitate)   Fall at home, initial encounter   Thrombocytopenia ([place])   Anemia due to chronic kidney disease   Femur fracture, right [facility])   Hospital Course: 89 year old with history of HTN, remote breast cancer comes to the hospital with complaints of weakness and fall when trying to get out of car.  Patient was found to have communicated diametaphyseal distal femoral fracture with impaction at the femoral site.  Orthopedic consulted, and underwent ORIF on 1/15.  Postop developed blood loss anemia requiring 2 units of PRBC transfusion. Hemoglobin on the day of discharge was 7.9.  She was also started on iron plus supplement to help.  She has been given Eliquis for 30 days as DVT prophylaxis. She is also being given oxycodone for pain to be used as needed.  Please monitor for any constipation related to opioid use and use bowel regimen appropriately.  Patient also has chronic thrombocytopenia which seems stable.  Orthopedic is also recommending continuation of vitamin D 1000 mg daily.  Patient will continue with rest of her home medications and follow-up with her providers for further recommendations.   Pain control - [employer] Controlled Substance Reporting System database was reviewed. and patient was instructed, not to drive, operate heavy machinery,  perform activities at heights, swimming or participation in water activities or provide baby-sitting services while on Pain, Sleep and Anxiety Medications; until their outpatient Physician has advised to do so again. Also recommended to not to take more than prescribed Pain, Sleep and Anxiety Medications.  Consultants: Orthopedic surgery Procedures performed: ORIF on 12/13/2022 Disposition: Skilled nursing facility Diet recommendation:  Cardiac diet DISCHARGE MEDICATION: Allergies as of 12/15/2022       Reactions   Ace Inhibitors Cough   Lortab [hydrocodone-acetaminophen] Other (See Comments)   Bad dreams   Remeron [mirtazapine] Other (See Comments)   Hallucinations (can take 1/2 tablet--7.5 mg), not 15 mg   Penicillins Rash, Other (See Comments)   Throat swelling, Has patient had a PCN reaction causing immediate rash, facial/tongue/throat swelling, SOB or lightheadedness with hypotension: Yes Has patient had a PCN reaction causing severe rash involving mucus membranes or skin necrosis: No Has patient had a PCN reaction that required hospitalization No Has patient had a PCN reaction occurring within the last 10 years: No If all of the above answers are "NO", then may proceed with Cephalosporin use.        Medication List     TAKE these medications    ACETAMINOPHEN ER PO Take 500 mg by mouth daily as needed for pain.   apixaban 2.5 MG Tabs tablet Commonly known as: Eliquis Take 1 tablet (2.5 mg total) by mouth 2 (two) times daily.   bisacodyl 5 MG EC tablet Commonly known as: DULCOLAX Take 1 tablet (5 mg total) by mouth daily as needed for moderate constipation.  CALCIUM PO Take 1 capsule by mouth daily.   cycloSPORINE 0.05 % ophthalmic emulsion Commonly known as: RESTASIS Place 1 drop into both eyes daily. What changed: when to take this   docusate sodium 100 MG capsule Commonly known as: COLACE Take 1 capsule (100 mg total) by mouth 2 (two) times daily.   Fe  Fum-Vit C-Vit B12-FA Caps capsule Commonly known as: TRIGELS-F FORTE Take 1 capsule by mouth daily after breakfast.   FISH OIL PO Take 1 capsule by mouth daily.   losartan 50 MG tablet Commonly known as: COZAAR Take 1 tablet (50 mg total) by mouth daily.   multivitamin tablet Take 1 tablet by mouth daily.   omeprazole 40 MG capsule Commonly known as: PRILOSEC TAKE 1 CAPSULE BY MOUTH ONCE DAILY BEFORE BREAKFAST What changed:  how much to take how to take this when to take this additional instructions   OVER THE COUNTER MEDICATION Take 1 capsule by mouth daily. Gluco Gel Joint & Cartilage Support   OVER THE COUNTER MEDICATION Apply 1 application  topically daily as needed (pain relief). Youngevity Pain Cream   oxyCODONE 5 MG immediate release tablet Commonly known as: Oxy IR/ROXICODONE Take 1 tablet (5 mg total) by mouth every 6 (six) hours as needed for moderate pain or severe pain.   polyethylene glycol 17 g packet Commonly known as: MIRALAX / GLYCOLAX Take 17 g by mouth daily as needed for mild constipation.   vitamin D3 25 MCG tablet Commonly known as: CHOLECALCIFEROL Take 1 tablet (1,000 Units total) by mouth daily. What changed:  medication strength how much to take        Contact information for follow-up providers     Haddix, Thomasene Lot, MD. Schedule an appointment as soon as possible for a visit in 2 week(s).   Specialty: Orthopedic Surgery Why: For wound check, repeat x-rays, suture removal Contact information: [address] [place] [address] [phone]         Ngetich, Nelda Bucks, NP. Schedule an appointment as soon as possible for a visit in 1 week(s).   Specialty: Family Medicine Contact information: [address] [place] [address] [phone]              Contact information for after-discharge care     Destination     [facility] .   Service: Skilled Nursing Contact  information: [address] [place] [phone]                    Discharge Exam: There were no vitals filed for this visit. General.  Frail and malnourished elderly lady, in no acute distress. Pulmonary.  Lungs clear bilaterally, normal respiratory effort. CV.  Regular rate and rhythm, no JVD, rub or murmur. Abdomen.  Soft, nontender, nondistended, BS positive. CNS.  Alert and oriented .  No focal neurologic deficit. Extremities.  No edema, no cyanosis, pulses intact and symmetrical. Psychiatry.  Judgment and insight appears impaired.  Condition at discharge: stable  The results of significant diagnostics from this hospitalization (including imaging, microbiology, ancillary and laboratory) are listed below for reference.   Imaging Studies: DG Knee Right Port  Result Date: 12/13/2022 CLINICAL DATA:  Postop EXAM: PORTABLE RIGHT KNEE - 1-2 VIEW COMPARISON:  CT from earlier the same day FINDINGS: Lateral plate and screw fixation of distal femoral shaft fracture, major fragments in near anatomic alignment. No evident complication. Moderate tricompartmental DJD as before. 3.3 cm  suprapatellar calcification consistent with the synovial osteochondromatosis previously described. IMPRESSION: Lateral plate and screw fixation of distal femoral shaft fracture, major fragments in near anatomic alignment. Electronically Signed   By: Lucrezia Europe M.D.   On: 12/13/2022 10:20   DG FEMUR, MIN 2 VIEWS RIGHT  Result Date: 12/13/2022 CLINICAL DATA:  ORIF distal right femur fracture. EXAM: RIGHT FEMUR 2 VIEWS; DG C-ARM 1-60 MIN-NO REPORT COMPARISON:  12/11/2022; right knee CT-12/11/2022 FLUOROSCOPY TIME: FLUOROSCOPY TIME 30 seconds (2.1 mGy) FINDINGS: Six spot intraoperative fluoroscopic images during sideplate fixation comminuted right distal femur fracture are provided for review. Completion images demonstrate marked improved alignment of known distal comminuted  fracture. There is expected subcutaneous emphysema about the operative site. No radiopaque foreign body. Redemonstrated calcified loose body within the superior patellofemoral joint space. Suspected moderate to severe tricompartmental degenerative change of the knee, incompletely evaluated. IMPRESSION: Post ORIF of comminuted right distal femur fracture without evidence of complication. Electronically Signed   By: Sandi Mariscal M.D.   On: 12/13/2022 09:19   DG C-Arm 1-60 Min-No Report  Result Date: 12/13/2022 Fluoroscopy was utilized by the requesting physician.  No radiographic interpretation.   CT Knee Right Wo Contrast  Result Date: 12/12/2022 CLINICAL DATA:  Distal right femoral fracture with comminution. EXAM: CT OF THE RIGHT KNEE WITHOUT CONTRAST TECHNIQUE: Multidetector CT imaging of the right knee was performed according to the standard protocol. Multiplanar CT image reconstructions were also generated. RADIATION DOSE REDUCTION: This exam was performed according to the departmental dose-optimization program which includes automated exposure control, adjustment of the mA and/or kV according to patient size and/or use of iterative reconstruction technique. COMPARISON:  Right knee and femoral series earlier today. FINDINGS: Bones/Joint/Cartilage Diffuse osteopenia. Again noted is a comminuted diametaphyseal distal femoral fracture with impaction at the fracture site. There is a butterfly comminution fragment of the medial aspect of the distal fragment of the distal femoral metaphysis, slightly displaced and additional smaller comminution fragments between fracture margins. The main distal fragment is displaced posteriorly up to 1/2 of a shaft width, maybe only approximately 1/3 of the shaft width, with mild angulation towards the medial aspect and moderate dorsal angulation of the distal fragment. The fracture does not extend into the condyles. There has no proximal tibial or fibular fracture. Background  advanced tricompartmental degenerative arthrosis with bulky marginal osteophytes and with juxta-articular sclerosis and microcystic change along the femorotibial articulation. There is mild spurring of the proximal tibiofibular joint. There is a moderate heterogeneous hemarthrosis, most of which is suprapatellar. There is a 2.9 cm popcorn like calcification in the lateral gutter of the suprapatellar bursal space, additional small subcentimeter loose bodies medially, findings consistent with synovial osteochondromatosis. Ligaments Suboptimally assessed by CT. Muscles and Tendons Not well seen with this technique. The extensor mechanism is grossly intact. Partial fatty atrophy in the dorsal thigh compartment is noted, mild atrophy in the foreleg musculature. No visible intramuscular hematoma or soft tissue gas. Soft tissues Diffuse superficial edema. No soft tissue gas is seen. No soft tissue mass. Other: Partially visible left knee arthroplasty. IMPRESSION: 1. Comminuted diametaphyseal distal femoral fracture with impaction at the fracture site, displacement and angulation as described above. 2. Moderate heterogeneous hemarthrosis. 3. Osteopenia and degenerative change. 4. Diffuse superficial edema. 5. 2.9 cm popcorn like calcification in the lateral gutter of the suprapatellar bursal space, additional small subcentimeter loose bodies medially, findings consistent with synovial osteochondromatosis. Electronically Signed   By: Telford Nab M.D.   On: 12/12/2022 00:15  DG Femur Min 2 Views Right  Result Date: 12/11/2022 CLINICAL DATA:  [place], right leg tenderness EXAM: RIGHT FEMUR 2 VIEWS; RIGHT KNEE - 1-2 VIEW; DG HIP (WITH OR WITHOUT PELVIS) 2-3V RIGHT COMPARISON:  None Available. FINDINGS: Right hip: Frontal view of the pelvis as well as frontal and cross-table lateral views of the right hip are obtained. Patient is rotated to the right during imaging. There are no acute displaced fractures. The hips are well  aligned. Mild symmetrical bilateral hip osteoarthritis. Calcification within the pelvis consistent with calcified fibroid. Right femur: Frontal and cross-table lateral views of the right femur are obtained. There is a comminuted impacted fracture of the distal right femoral metadiaphyseal junction. Ventral angulation at the fracture site. Overlying soft tissue swelling. Remaining portions of the right femur are unremarkable. Right knee: Frontal and cross-table lateral views are obtained. There is a comminuted impacted fracture of the distal right femoral metadiaphyseal junction, with ventral angulation at the fracture site. Fracture does not appear to extend into the femoral condyles or joint space of the right knee. There is severe osteoarthritis. Synovial osteochondromatosis within the suprapatellar region. Diffuse soft tissue swelling. IMPRESSION: 1. Comminuted impacted fracture of the distal right femoral metadiaphyseal junction. 2. Severe soft tissue swelling surrounding the distal femur and right knee. 3. No other acute bony abnormalities. 4. 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 Electronically Signed   By: Randa Ngo M.D.   On: 12/11/2022 22:50   DG Hip Unilat W or Wo Pelvis 2-3 Views Right  Result Date: 12/11/2022 CLINICAL DATA:  [place], right leg tenderness EXAM: RIGHT FEMUR 2 VIEWS; RIGHT KNEE - 1-2 VIEW; DG HIP (WITH OR WITHOUT PELVIS) 2-3V RIGHT COMPARISON:  None Available. FINDINGS: Right hip: Frontal view of the pelvis as well as frontal and cross-table lateral views of the right hip are obtained. Patient is rotated to the right during imaging. There are no acute displaced fractures. The hips are well aligned. Mild symmetrical bilateral hip osteoarthritis. Calcification within the pelvis consistent with calcified fibroid. Right femur: Frontal and cross-table lateral views of the right femur are obtained. There is a comminuted impacted fracture of the distal right femoral  metadiaphyseal junction. Ventral angulation at the fracture site. Overlying soft tissue swelling. Remaining portions of the right femur are unremarkable. Right knee: Frontal and cross-table lateral views are obtained. There is a comminuted impacted fracture of the distal right femoral metadiaphyseal junction, with ventral angulation at the fracture site. Fracture does not appear to extend into the femoral condyles or joint space of the right knee. There is severe osteoarthritis. Synovial osteochondromatosis within the suprapatellar region. Diffuse soft tissue swelling. IMPRESSION: 1. Comminuted impacted fracture of the distal right femoral metadiaphyseal junction. 2. Severe soft tissue swelling surrounding the distal femur and right knee. 3. No other acute bony abnormalities. 4. 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 Electronically Signed   By: Randa Ngo M.D.   On: 12/11/2022 22:50   DG Knee 1-2 Views Right  Result Date: 12/11/2022 CLINICAL DATA:  [place], right leg tenderness EXAM: RIGHT FEMUR 2 VIEWS; RIGHT KNEE - 1-2 VIEW; DG HIP (WITH OR WITHOUT PELVIS) 2-3V RIGHT COMPARISON:  None Available. FINDINGS: Right hip: Frontal view of the pelvis as well as frontal and cross-table lateral views of the right hip are obtained. Patient is rotated to the right during imaging. There are no acute displaced fractures. The hips are well aligned. Mild symmetrical bilateral hip osteoarthritis. Calcification within the pelvis consistent with  calcified fibroid. Right femur: Frontal and cross-table lateral views of the right femur are obtained. There is a comminuted impacted fracture of the distal right femoral metadiaphyseal junction. Ventral angulation at the fracture site. Overlying soft tissue swelling. Remaining portions of the right femur are unremarkable. Right knee: Frontal and cross-table lateral views are obtained. There is a comminuted impacted fracture of the distal right femoral metadiaphyseal  junction, with ventral angulation at the fracture site. Fracture does not appear to extend into the femoral condyles or joint space of the right knee. There is severe osteoarthritis. Synovial osteochondromatosis within the suprapatellar region. Diffuse soft tissue swelling. IMPRESSION: 1. Comminuted impacted fracture of the distal right femoral metadiaphyseal junction. 2. Severe soft tissue swelling surrounding the distal femur and right knee. 3. No other acute bony abnormalities. 4. 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 Electronically Signed   By: Randa Ngo M.D.   On: 12/11/2022 22:50   CT Cervical Spine Wo Contrast  Result Date: 12/11/2022 CLINICAL DATA:  [place], right knee pain EXAM: CT CERVICAL SPINE WITHOUT CONTRAST TECHNIQUE: Multidetector CT imaging of the cervical spine was performed without intravenous contrast. Multiplanar CT image reconstructions were also generated. RADIATION DOSE REDUCTION: This exam was performed according to the departmental dose-optimization program which includes automated exposure control, adjustment of the mA and/or kV according to patient size and/or use of iterative reconstruction technique. COMPARISON:  None Available. FINDINGS: Alignment: Straightening of the cervical spine due to extensive spondylosis from C3 through C5. Otherwise alignment is anatomic. Skull base and vertebrae: No acute fracture. No primary bone lesion or focal pathologic process. Soft tissues and spinal canal: No prevertebral fluid or swelling. No visible canal hematoma. Disc levels: There is bony fusion across the disc spaces and facet joints from C3 through C5. Prominent spondylosis at C5-6 and C6-7. Upper chest: Airway is patent.  Lung apices are clear. Other: Reconstructed images demonstrate no additional findings. IMPRESSION: 1. No acute cervical spine fracture. 2. Extensive multilevel spondylosis and facet hypertrophy. Electronically Signed   By: Randa Ngo M.D.   On:  12/11/2022 22:20   CT Head Wo Contrast  Result Date: 12/11/2022 CLINICAL DATA:  [place], right knee pain EXAM: CT HEAD WITHOUT CONTRAST TECHNIQUE: Contiguous axial images were obtained from the base of the skull through the vertex without intravenous contrast. RADIATION DOSE REDUCTION: This exam was performed according to the departmental dose-optimization program which includes automated exposure control, adjustment of the mA and/or kV according to patient size and/or use of iterative reconstruction technique. COMPARISON:  07/30/2022 FINDINGS: Brain: No acute infarct or hemorrhage. Lateral ventricles and midline structures are unremarkable. No acute extra-axial fluid collections. No mass effect. Vascular: No hyperdense vessel or unexpected calcification. Skull: Normal. Negative for fracture or focal lesion. Sinuses/Orbits: Mild ethmoid sinus mucosal thickening. Remaining paranasal sinuses are clear. Other: None. IMPRESSION: 1. No acute intracranial process. Electronically Signed   By: Randa Ngo M.D.   On: 12/11/2022 22:18    Microbiology: Results for orders placed or performed during the hospital encounter of 12/11/22  Surgical PCR screen     Status: None   Collection Time: 12/12/22 10:50 PM   Specimen: Nasal Mucosa; Nasal Swab  Result Value Ref Range Status   MRSA, PCR NEGATIVE NEGATIVE Final   Staphylococcus aureus NEGATIVE NEGATIVE Final    Comment: (NOTE) The Xpert SA Assay (FDA approved for NASAL specimens in patients 46 years of age and older), is one component of a comprehensive surveillance program. It is not intended  to diagnose infection nor to guide or monitor treatment. Performed at [facility], [place] [address], [place], [place] [address]     Labs: CBC: Recent Labs  Lab 12/12/22 0207 12/13/22 0011 12/14/22 0657 12/15/22 0653  WBC 8.8 10.0 9.7 7.0  NEUTROABS 7.6  --   --   --   HGB 8.6* 9.3* 5.6* 7.9*  HCT 27.1* 30.6* 17.4* 23.9*  MCV 80.9 85.5 80.2 81.0  PLT  141* 164 105* 761*   Basic Metabolic Panel: Recent Labs  Lab 12/12/22 0207 12/12/22 0552 12/13/22 0011 12/14/22 0657 12/15/22 0653  NA 145  --  147* 138 139  K 3.9  --  4.3 4.1 3.7  CL 115*  --  113* 107 111  CO2 22  --  '23 24 24  '$ GLUCOSE 143*  --  66* 93 81  BUN 22  --  22 26* 23  CREATININE 0.94  --  0.96 1.17* 0.90  CALCIUM 9.1  --  9.5 8.2* 8.3*  MG  --  2.0 2.2 1.9 2.0   Liver Function Tests: Recent Labs  Lab 12/12/22 0207  AST 30  ALT 24  ALKPHOS 55  BILITOT 0.4  PROT 5.8*  ALBUMIN 3.0*   CBG: Recent Labs  Lab 12/13/22 0753 12/14/22 2004  GLUCAP 116* 100*    Discharge time spent: greater than 30 minutes.  This record has been created using Systems analyst. Errors have been sought and corrected,but may not always be located. Such creation errors do not reflect on the standard of care.   Signed: Lorella Nimrod, MD [facility] 12/15/2022

## 2022-12-15 NOTE — Progress Notes (Signed)
Orthopaedic Trauma Progress Note  SUBJECTIVE: Doing fairly well this morning.  Pain controlled.  Notes difficulty lifting her leg which we discussed is normal given her injury and surgery.  Denies any significant numbness or tingling throughout the extremity.  No other issues of note.  OBJECTIVE:  Vitals:   12/15/22 0545 12/15/22 0731  BP: 135/76 (!) 109/59  Pulse: 69 61  Resp:  18  Temp:  98 F (36.7 C)  SpO2: 99% 100%    General: Sitting up in bed, NAD.  Eating breakfast Respiratory: No increased work of breathing.  RLE: Dressings removed, incisions CDI. Compartments soft and compressible. Able to wiggle toes. + sensation DPN, SPN, TN.  2+ DP pulse  IMAGING: Stable post op imaging.   LABS:  Results for orders placed or performed during the hospital encounter of 12/11/22 (from the past 24 hour(s))  Prepare RBC (crossmatch)     Status: None   Collection Time: 12/14/22  9:00 AM  Result Value Ref Range   Order Confirmation      ORDER PROCESSED BY BLOOD BANK Performed at [facility], [address], [place], [place] [address]   Glucose, capillary     Status: Abnormal   Collection Time: 12/14/22  8:04 PM  Result Value Ref Range   Glucose-Capillary 100 (H) 70 - 99 mg/dL  Basic metabolic panel     Status: Abnormal   Collection Time: 12/15/22  6:53 AM  Result Value Ref Range   Sodium 139 135 - 145 mmol/L   Potassium 3.7 3.5 - 5.1 mmol/L   Chloride 111 98 - 111 mmol/L   CO2 24 22 - 32 mmol/L   Glucose, Bld 81 70 - 99 mg/dL   BUN 23 8 - 23 mg/dL   Creatinine, Ser 0.90 0.44 - 1.00 mg/dL   Calcium 8.3 (L) 8.9 - 10.3 mg/dL   GFR, Estimated >60 >60 mL/min   Anion gap 4 (L) 5 - 15  CBC     Status: Abnormal   Collection Time: 12/15/22  6:53 AM  Result Value Ref Range   WBC 7.0 4.0 - 10.5 K/uL   RBC 2.95 (L) 3.87 - 5.11 MIL/uL   Hemoglobin 7.9 (L) 12.0 - 15.0 g/dL   HCT 23.9 (L) 36.0 - 46.0 %   MCV 81.0 80.0 - 100.0 fL   MCH 26.8 26.0 - 34.0 pg   MCHC 33.1 30.0 - 36.0  g/dL   RDW 15.6 (H) 11.5 - 15.5 %   Platelets 114 (L) 150 - 400 K/uL   nRBC 0.0 0.0 - 0.2 %  Magnesium     Status: None   Collection Time: 12/15/22  6:53 AM  Result Value Ref Range   Magnesium 2.0 1.7 - 2.4 mg/dL    ASSESSMENT: Tracy Faulkner is a 89 y.o. female, 2 Days Post-Op s/p OPEN REDUCTION INTERNAL FIXATION RIGHT DISTAL FEMUR FRACTURE  CV/Blood loss: Acute blood loss anemia, Hgb 7.9 this AM. Received 2 units PRBCs 12/14/22  PLAN: Weightbearing: WBAT RLE ROM:  ok for unrestricted ROM  Incisional and dressing care: Removed today, ok to leave open to air. Continue to change PRN Showering: Hold off for now Orthopedic device(s): None  Pain management:  1. Tylenol 650 mg q 6 hours PRN 2. Oxycodone 5 mg q 4 hours PRN 3. Morphine 0.5-1 mg q 2 hours PRN VTE prophylaxis: Lovenox. SCDs ID:  Vancomycin post op completed Foley/Lines: Foley removed this AM. KVO IVFs Impediments to Fracture Healing: Vit D level  24, started on D3 supplementation Dispo: PT/OT as tolerated, recommending SNF. Insurance Josem Kaufmann has been started for [employer]. Ok for d/c from ortho standpoint once cleared by therapies and medicine team  D/C recommendations: I have signed and placed Rx for these in patient's chart - Oxycodone 5 mg and Tylenol PRN for pain control - Eliquis 2.5 mg BID x 30 days for DVT prophylaxis - Continue 1000 units daily Vit D supplementation x 90 days  Follow - up plan: 2 weeks   Contact information:  Katha Hamming MD, Rushie Nyhan PA-C. After hours and holidays please check [email or web address] for group call information for [facility]   Gwinda Passe, PA-C [phone] (office) [email or web address]

## 2022-12-15 NOTE — Discharge Instructions (Signed)
Orthopaedic Trauma Service Discharge Instructions   General Discharge Instructions  WEIGHT BEARING STATUS: Weightbearing as tolerated right lower extremity  RANGE OF MOTION/ACTIVITY: Okay for knee range of motion as tolerated  Wound Care: Incisions can be left open to air if there is no drainage. Once the incision is completely dry and without drainage, it may be left open to air out.  Showering may begin Friday, 12/17/2022.  Clean incision gently with soap and water.  DVT/PE prophylaxis:  Eliquis 2.5 mg twice daily x 30 days  Diet: as you were eating previously.  Can use over the counter stool softeners and bowel preparations, such as Miralax, to help with bowel movements.  Narcotics can be constipating.  Be sure to drink plenty of fluids  PAIN MEDICATION USE AND EXPECTATIONS  You have likely been given narcotic medications to help control your pain.  After a traumatic event that results in an fracture (broken bone) with or without surgery, it is ok to use narcotic pain medications to help control one's pain.  We understand that everyone responds to pain differently and each individual patient will be evaluated on a regular basis for the continued need for narcotic medications. Ideally, narcotic medication use should last no more than 6-8 weeks (coinciding with fracture healing).   As a patient it is your responsibility as well to monitor narcotic medication use and report the amount and frequency you use these medications when you come to your office visit.   We would also advise that if you are using narcotic medications, you should take a dose prior to therapy to maximize you participation.  IF YOU ARE ON NARCOTIC MEDICATIONS IT IS NOT PERMISSIBLE TO OPERATE A MOTOR VEHICLE (MOTORCYCLE/CAR/TRUCK/MOPED) OR HEAVY MACHINERY DO NOT MIX NARCOTICS WITH OTHER CNS (CENTRAL NERVOUS SYSTEM) DEPRESSANTS SUCH AS ALCOHOL   STOP SMOKING OR USING NICOTINE PRODUCTS!!!!  As discussed nicotine severely  impairs your body's ability to heal surgical and traumatic wounds but also impairs bone healing.  Wounds and bone heal by forming microscopic blood vessels (angiogenesis) and nicotine is a vasoconstrictor (essentially, shrinks blood vessels).  Therefore, if vasoconstriction occurs to these microscopic blood vessels they essentially disappear and are unable to deliver necessary nutrients to the healing tissue.  This is one modifiable factor that you can do to dramatically increase your chances of healing your injury.    (This means no smoking, no nicotine gum, patches, etc)  DO NOT USE NONSTEROIDAL ANTI-INFLAMMATORY DRUGS (NSAID'S)  Using products such as Advil (ibuprofen), Aleve (naproxen), Motrin (ibuprofen) for additional pain control during fracture healing can delay and/or prevent the healing response.  If you would like to take over the counter (OTC) medication, Tylenol (acetaminophen) is ok.  However, some narcotic medications that are given for pain control contain acetaminophen as well. Therefore, you should not exceed more than 4000 mg of tylenol in a day if you do not have liver disease.  Also note that there are may OTC medicines, such as cold medicines and allergy medicines that my contain tylenol as well.  If you have any questions about medications and/or interactions please ask your doctor/PA or your pharmacist.      ICE AND ELEVATE INJURED/OPERATIVE EXTREMITY  Using ice and elevating the injured extremity above your heart can help with swelling and pain control.  Icing in a pulsatile fashion, such as 20 minutes on and 20 minutes off, can be followed.    Do not place ice directly on skin. Make sure there is  a barrier between to skin and the ice pack.    Using frozen items such as frozen peas works well as the conform nicely to the are that needs to be iced.  USE AN ACE WRAP OR TED HOSE FOR SWELLING CONTROL  In addition to icing and elevation, Ace wraps or TED hose are used to help limit  and resolve swelling.  It is recommended to use Ace wraps or TED hose until you are informed to stop.    When using Ace Wraps start the wrapping distally (farthest away from the body) and wrap proximally (closer to the body)   Example: If you had surgery on your leg or thing and you do not have a splint on, start the ace wrap at the toes and work your way up to the thigh        If you had surgery on your upper extremity and do not have a splint on, start the ace wrap at your fingers and work your way up to the upper arm  [place]: [phone]   VISIT OUR WEBSITE FOR ADDITIONAL INFORMATION: [email or web address]     Discharge Wound Care Instructions  Do NOT apply any ointments, solutions or lotions to pin sites or surgical wounds.  These prevent needed drainage and even though solutions like hydrogen peroxide kill bacteria, they also damage cells lining the pin sites that help fight infection.  Applying lotions or ointments can keep the wounds moist and can cause them to breakdown and open up as well. This can increase the risk for infection. When in doubt call the office.   If any drainage is noted, use one layer of adaptic or Mepitel, then gauze, Kerlix, and an ace wrap. - These dressing supplies should be available at local medical supply stores [facility], [facility], etc) as well as Management consultant ([facility], [facility], [place], etc)  Once the incision is completely dry and without drainage, it may be left open to air out.  Showering may begin 36-48 hours later.  Cleaning gently with soap and water.

## 2022-12-15 NOTE — TOC Transition Note (Signed)
Transition of Care [facility]) - CM/SW Discharge Note   Patient Details  Name: Tracy Faulkner MRN: [id number] Date of Birth: 29-Apr-1932  Transition of Care [facility]) CM/SW Contact:  Curlene Labrum, RN Phone Number: 12/15/2022, 11:15 AM   Clinical Narrative:    CM spoke with Perrin Smack, CM at [facility] and the facility is able to admit her today.  The patient is medically stable for discharge and discharge orders and summary were placed by the MD attending.  Discharge summary and orders were uploaded  in the hub for the facility.  [facility] is able to accept her today at 1:30 pm.  I called and left a voicemail with the patient's daughter by phone.  Helene Kelp will reach out to the daughter to complete admission paperwork today.  PTAR was called and transportation was arranged for 1:30 pm today.    PTAR packet at secretary's station.  Bedside nursing - please call report to [facility] today at [phone] after lunch today per facility request when bed is available.   Final next level of care: Skilled Nursing Facility Barriers to Discharge: No Barriers Identified   Patient Goals and CMS Choice CMS Medicare.gov Compare Post Acute Care list provided to:: Patient Represenative (must comment) (daughter, [name]) Choice offered to / list presented to : Adult Children  Discharge Placement                         Discharge Plan and Services Additional resources added to the After Visit Summary for     Discharge Planning Services: CM Consult Post Acute Care Choice: [place]                               Social Determinants of Health (SDOH) Interventions SDOH Screenings   Food Insecurity: No Food Insecurity (12/12/2022)  Housing: Low Risk  (12/12/2022)  Transportation Needs: No Transportation Needs (12/12/2022)  Utilities: Not At Risk (12/12/2022)  Depression (PHQ2-9): Low Risk  (09/02/2022)  Tobacco Use: Low Risk  (12/14/2022)     Readmission Risk  Interventions    12/15/2022   11:11 AM  Readmission Risk Prevention Plan  Transportation Screening Complete  PCP or Specialist Appt within 3-5 Days Complete  HRI or [place] Complete  Social Work Consult for [place] Planning/Counseling Complete  Palliative Care Screening Complete  Medication Review Press photographer) Complete

## 2022-12-15 NOTE — Progress Notes (Signed)
Report called to [facility] and given to nurse [facility]. No questions or concerns at this time
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
## 2022-12-22 DIAGNOSIS — S72491D Other fracture of lower end of right femur, subsequent encounter for closed fracture with routine healing: Secondary | ICD-10-CM | POA: Diagnosis not present

## 2022-12-22 DIAGNOSIS — M6281 Muscle weakness (generalized): Secondary | ICD-10-CM | POA: Diagnosis not present

## 2022-12-22 DIAGNOSIS — Z9181 History of falling: Secondary | ICD-10-CM | POA: Diagnosis not present

## 2022-12-22 DIAGNOSIS — I10 Essential (primary) hypertension: Secondary | ICD-10-CM | POA: Diagnosis not present

## 2022-12-22 DIAGNOSIS — D649 Anemia, unspecified: Secondary | ICD-10-CM | POA: Diagnosis not present

## 2022-12-22 DIAGNOSIS — Z4789 Encounter for other orthopedic aftercare: Secondary | ICD-10-CM | POA: Diagnosis not present

## 2022-12-22 DIAGNOSIS — S72491S Other fracture of lower end of right femur, sequela: Secondary | ICD-10-CM | POA: Diagnosis not present

## 2022-12-22 DIAGNOSIS — R2681 Unsteadiness on feet: Secondary | ICD-10-CM | POA: Diagnosis not present

## 2022-12-24 DIAGNOSIS — S72491S Other fracture of lower end of right femur, sequela: Secondary | ICD-10-CM | POA: Diagnosis not present

## 2022-12-24 DIAGNOSIS — D649 Anemia, unspecified: Secondary | ICD-10-CM | POA: Diagnosis not present

## 2022-12-27 DIAGNOSIS — S72491S Other fracture of lower end of right femur, sequela: Secondary | ICD-10-CM | POA: Diagnosis not present

## 2022-12-27 DIAGNOSIS — I10 Essential (primary) hypertension: Secondary | ICD-10-CM | POA: Diagnosis not present

## 2022-12-27 DIAGNOSIS — D649 Anemia, unspecified: Secondary | ICD-10-CM | POA: Diagnosis not present

## 2022-12-28 DIAGNOSIS — S72451D Displaced supracondylar fracture without intracondylar extension of lower end of right femur, subsequent encounter for closed fracture with routine healing: Secondary | ICD-10-CM | POA: Diagnosis not present

## 2022-12-28 DIAGNOSIS — S72491S Other fracture of lower end of right femur, sequela: Secondary | ICD-10-CM | POA: Diagnosis not present

## 2022-12-28 DIAGNOSIS — I10 Essential (primary) hypertension: Secondary | ICD-10-CM | POA: Diagnosis not present

## 2022-12-29 DIAGNOSIS — Z4789 Encounter for other orthopedic aftercare: Secondary | ICD-10-CM | POA: Diagnosis not present

## 2022-12-29 DIAGNOSIS — S72491D Other fracture of lower end of right femur, subsequent encounter for closed fracture with routine healing: Secondary | ICD-10-CM | POA: Diagnosis not present

## 2022-12-29 DIAGNOSIS — M6281 Muscle weakness (generalized): Secondary | ICD-10-CM | POA: Diagnosis not present

## 2022-12-29 DIAGNOSIS — D649 Anemia, unspecified: Secondary | ICD-10-CM | POA: Diagnosis not present

## 2022-12-29 DIAGNOSIS — R2681 Unsteadiness on feet: Secondary | ICD-10-CM | POA: Diagnosis not present

## 2022-12-29 DIAGNOSIS — Z9181 History of falling: Secondary | ICD-10-CM | POA: Diagnosis not present

## 2022-12-30 DIAGNOSIS — I10 Essential (primary) hypertension: Secondary | ICD-10-CM | POA: Diagnosis not present

## 2022-12-30 DIAGNOSIS — S72491S Other fracture of lower end of right femur, sequela: Secondary | ICD-10-CM | POA: Diagnosis not present

## 2023-01-05 ENCOUNTER — Encounter: Payer: Medicare PPO | Admitting: Family

## 2023-01-05 ENCOUNTER — Encounter: Payer: Self-pay | Admitting: Family

## 2023-01-05 ENCOUNTER — Ambulatory Visit (INDEPENDENT_AMBULATORY_CARE_PROVIDER_SITE_OTHER): Payer: Medicare PPO | Admitting: Family

## 2023-01-05 VITALS — BP 130/94 | HR 83 | Temp 98.0°F | Resp 18 | Ht 64.5 in | Wt 146.6 lb

## 2023-01-05 DIAGNOSIS — M25551 Pain in right hip: Secondary | ICD-10-CM | POA: Diagnosis not present

## 2023-01-05 DIAGNOSIS — R2681 Unsteadiness on feet: Secondary | ICD-10-CM

## 2023-01-05 DIAGNOSIS — I10 Essential (primary) hypertension: Secondary | ICD-10-CM | POA: Diagnosis not present

## 2023-01-05 NOTE — Progress Notes (Signed)
Provider: Marlowe Sax FNP-C  Zaviyar Rahal, Nelda Bucks, NP  Patient Care Team: Etrulia Zarr, Nelda Bucks, NP as PCP - General (Family Medicine) Belva Crome, MD (Inactive) as PCP - Cardiology (Cardiology) Belva Crome, MD (Inactive) as Consulting Physician (Cardiology)  Extended Emergency Contact Information Primary Emergency Contact: [place] Phone: [phone] Mobile Phone: [phone] Relation: Daughter  Code Status:  Full Code  Goals of care: Advanced Directive information    12/13/2022    7:51 AM  Advanced Directives  Does Patient Have a Medical Advance Directive? Yes  Type of Paramedic of [place];Living will  Copy of [place] in Chart? No - copy requested     Chief Complaint  Patient presents with   Transitions Of Care    Patient is here for a follow up after being released from [place]. Possible blood in stool, and having right ear issues.    HPI:  Pt is a 89 y.o. female seen today for an acute visit for status post hospitalization 12/11/2022 - 12/15/2022 for fall when trying to get out of the car.she sustained a right communicated diametaphyseal distal femoral fracture with impaction at the femoral site.Orthopedic was consulted and underwent ORIF on 12/13/2022.Postop was complicated by blood loss anemia was transfused 2 units of PRBC.Hgb 7.9 on discharge. She was discharged on Eliquis for 30 days for DVT prophylaxis.states pain controlled with oxycodone.No constipation.Has a follow up appointment with Orthopedic next week.Has been ambulating with walker.Physical and Occupational Therapy will be starting tomorrow 01/06/2023 per daughter.Appetite described as good.   Past Medical History:  Diagnosis Date   Anemia    Arthritis    Ataxia    Back problem    Bowel trouble    Breast cancer ([place])    Carpal tunnel syndrome    Constipation    Disturbance, sleep    Dyspnea    increased exertion     Frequent headaches    GI bleed    History of mammogram 2000   History of urinary tract infection    Hypertension    Obesity    Pain in both knees    Sepsis ([place])    Urinary incontinence    Past Surgical History:  Procedure Laterality Date   BREAST SURGERY     for cancer-right breast   COLON SURGERY     secondary to bowel blockages   EYE SURGERY     cataract surgery bilateral    gastric stapling     KNEE ARTHROPLASTY Left 10/07/2016   Procedure: LEFT TOTAL KNEE ARTHROPLASTY WITH COMPUTER NAVIGATION;  Surgeon: Rod Can, MD;  Location: [facility];  Service: Orthopedics;  Laterality: Left;   ORIF FEMUR FRACTURE Right 12/13/2022   Procedure: OPEN REDUCTION INTERNAL FIXATION (ORIF) DISTAL FEMUR FRACTURE;  Surgeon: Shona Needles, MD;  Location: [facility];  Service: Orthopedics;  Laterality: Right;    Allergies  Allergen Reactions   Ace Inhibitors Cough   Lortab [Hydrocodone-Acetaminophen] Other (See Comments)    Bad dreams   Remeron [Mirtazapine] Other (See Comments)    Hallucinations (can take 1/2 tablet--7.5 mg), not 15 mg   Penicillins Rash and Other (See Comments)    Throat swelling, Has patient had a PCN reaction causing immediate rash, facial/tongue/throat swelling, SOB or lightheadedness with hypotension: Yes Has patient had a PCN reaction causing severe rash involving mucus membranes or skin necrosis: No Has patient had a PCN reaction that required hospitalization No Has patient had a  PCN reaction occurring within the last 10 years: No If all of the above answers are "NO", then may proceed with Cephalosporin use.     Outpatient Encounter Medications as of 01/05/2023  Medication Sig   ACETAMINOPHEN ER PO Take 500 mg by mouth daily as needed for pain.   apixaban (ELIQUIS) 2.5 MG TABS tablet Take 1 tablet (2.5 mg total) by mouth 2 (two) times daily.   bisacodyl (DULCOLAX) 5 MG EC tablet Take 1 tablet (5 mg total) by mouth daily as needed for moderate constipation.   CALCIUM PO  Take 1 capsule by mouth daily.   cholecalciferol (CHOLECALCIFEROL) 25 MCG tablet Take 1 tablet (1,000 Units total) by mouth daily.   cycloSPORINE (RESTASIS) 0.05 % ophthalmic emulsion Place 1 drop into both eyes daily. (Patient taking differently: Place 1 drop into both eyes 2 (two) times daily.)   docusate sodium (COLACE) 100 MG capsule Take 1 capsule (100 mg total) by mouth 2 (two) times daily.   Fe Fum-Vit C-Vit B12-FA (TRIGELS-F FORTE) CAPS capsule Take 1 capsule by mouth daily after breakfast.   losartan (COZAAR) 50 MG tablet Take 1 tablet (50 mg total) by mouth daily.   Multiple Vitamin (MULTIVITAMIN) tablet Take 1 tablet by mouth daily.   Omega-3 Fatty Acids (FISH OIL PO) Take 1 capsule by mouth daily.   omeprazole (PRILOSEC) 40 MG capsule TAKE 1 CAPSULE BY MOUTH ONCE DAILY BEFORE BREAKFAST (Patient taking differently: Take 40 mg by mouth daily.)   OVER THE COUNTER MEDICATION Take 1 capsule by mouth daily. Gluco Gel Joint & Cartilage Support   OVER THE COUNTER MEDICATION Apply 1 application  topically daily as needed (pain relief). Youngevity Pain Cream   oxyCODONE (OXY IR/ROXICODONE) 5 MG immediate release tablet Take 1 tablet (5 mg total) by mouth every 6 (six) hours as needed for moderate pain or severe pain.   polyethylene glycol (MIRALAX / GLYCOLAX) 17 g packet Take 17 g by mouth daily as needed for mild constipation.   No facility-administered encounter medications on file as of 01/05/2023.    Review of Systems  Constitutional:  Negative for appetite change, chills, fatigue, fever and unexpected weight change.  HENT:  Negative for congestion, dental problem, ear discharge, ear pain, facial swelling, hearing loss, nosebleeds, postnasal drip, rhinorrhea, sinus pressure, sinus pain, sneezing, sore throat, tinnitus and trouble swallowing.   Eyes:  Negative for pain, discharge, redness, itching and visual disturbance.  Respiratory:  Negative for cough, chest tightness, shortness of breath  and wheezing.   Cardiovascular:  Negative for chest pain, palpitations and leg swelling.  Gastrointestinal:  Negative for abdominal distention, abdominal pain, blood in stool, constipation, diarrhea, nausea and vomiting.  Endocrine: Negative for cold intolerance, heat intolerance, polydipsia, polyphagia and polyuria.  Genitourinary:  Negative for difficulty urinating, dysuria, flank pain, frequency and urgency.  Musculoskeletal:  Positive for arthralgias and gait problem. Negative for back pain, joint swelling, myalgias, neck pain and neck stiffness.       Right hip pain   Skin:  Negative for color change, pallor, rash and wound.       Right hip surgical incision   Neurological:  Negative for dizziness, syncope, speech difficulty, weakness, light-headedness, numbness and headaches.  Hematological:  Does not bruise/bleed easily.  Psychiatric/Behavioral:  Negative for agitation, behavioral problems, confusion, hallucinations and sleep disturbance. The patient is not nervous/anxious.     Immunization History  Administered Date(s) Administered   Fluad Quad(high Dose 65+) 09/07/2021, 09/24/2022   Influenza, High Dose Seasonal  PF 08/13/2019   Influenza,inj,quad, With Preservative 09/15/2017   Janssen (J&J) SARS-COV-2 Vaccination 02/03/2020   Pneumococcal Conjugate-13 08/18/2015   Pneumococcal Polysaccharide-23 12/13/2017   Tdap 10/11/2017   Zoster Recombinat (Shingrix) 10/11/2017, 12/13/2017   Zoster, Live 10/12/2017   Pertinent  Health Maintenance Due  Topic Date Due   INFLUENZA VACCINE  Completed   DEXA SCAN  Completed      12/11/2022    7:52 PM 12/12/2022    5:18 AM 12/12/2022    8:00 AM 12/12/2022    8:53 PM 01/05/2023    3:23 PM  Fall Risk  Falls in the past year?     1  Was there an injury with Fall?     1  Fall Risk Category Calculator     3  (RETIRED) Patient Fall Risk Level High fall risk High fall risk High fall risk High fall risk   Patient at Risk for Falls Due to      History of fall(s);Impaired balance/gait;Impaired mobility  Fall risk Follow up     Falls evaluation completed   Functional Status Survey:    Vitals:   01/05/23 1521  BP: (!) 130/94  Pulse: 83  Resp: 18  Temp: 98 F (36.7 C)  SpO2: 99%  Weight: 146 lb 9.6 oz (66.5 kg)  Height: 5' 4.5" (1.638 m)   Body mass index is 24.78 kg/m. Physical Exam Vitals reviewed.  Constitutional:      General: She is not in acute distress.    Appearance: Normal appearance. She is normal weight. She is not ill-appearing or diaphoretic.  HENT:     Head: Normocephalic.     Right Ear: Tympanic membrane, ear canal and external ear normal. There is no impacted cerumen.     Left Ear: Tympanic membrane, ear canal and external ear normal. There is no impacted cerumen.     Nose: Nose normal. No congestion or rhinorrhea.     Mouth/Throat:     Mouth: Mucous membranes are moist.     Pharynx: Oropharynx is clear. No oropharyngeal exudate or posterior oropharyngeal erythema.  Eyes:     General: No scleral icterus.       Right eye: No discharge.        Left eye: No discharge.     Extraocular Movements: Extraocular movements intact.     Conjunctiva/sclera: Conjunctivae normal.     Pupils: Pupils are equal, round, and reactive to light.  Neck:     Vascular: No carotid bruit.  Cardiovascular:     Rate and Rhythm: Normal rate and regular rhythm.     Pulses: Normal pulses.     Heart sounds: Normal heart sounds. No murmur heard.    No friction rub. No gallop.  Pulmonary:     Effort: Pulmonary effort is normal. No respiratory distress.     Breath sounds: Normal breath sounds. No wheezing, rhonchi or rales.  Chest:     Chest wall: No tenderness.  Abdominal:     General: Bowel sounds are normal. There is no distension.     Palpations: Abdomen is soft. There is no mass.     Tenderness: There is no abdominal tenderness. There is no right CVA tenderness, left CVA tenderness, guarding or rebound.   Musculoskeletal:        General: Normal range of motion.     Cervical back: Normal range of motion. No rigidity or tenderness.     Right lower leg: No edema.     Left lower  leg: No edema.     Comments: RLE limited ROM s/p post ORIF   Lymphadenopathy:     Cervical: No cervical adenopathy.  Skin:    General: Skin is warm and dry.     Coloration: Skin is not pale.     Findings: No bruising, erythema, lesion or rash.     Comments: Right hip/lateral thigh incision healed.No signs of infection noted   Neurological:     Mental Status: She is alert. Mental status is at baseline.     Cranial Nerves: No cranial nerve deficit.     Sensory: No sensory deficit.     Motor: No weakness.     Coordination: Coordination normal.     Gait: Gait abnormal.  Psychiatric:        Mood and Affect: Mood normal.        Speech: Speech normal.        Behavior: Behavior normal.    Labs reviewed: Recent Labs    12/13/22 0011 12/14/22 0657 12/15/22 0653  NA 147* 138 139  K 4.3 4.1 3.7  CL 113* 107 111  CO2 '23 24 24  '$ GLUCOSE 66* 93 81  BUN 22 26* 23  CREATININE 0.96 1.17* 0.90  CALCIUM 9.5 8.2* 8.3*  MG 2.2 1.9 2.0   Recent Labs    03/08/22 1146 07/30/22 0626 12/12/22 0207  AST '26 28 30  '$ ALT '19 18 24  '$ ALKPHOS  --  60 55  BILITOT 0.7 0.9 0.4  PROT 7.0 7.0 5.8*  ALBUMIN  --  3.5 3.0*   Recent Labs    07/28/22 1507 07/30/22 0626 07/31/22 0108 12/12/22 0207 12/13/22 0011 12/14/22 0657 12/15/22 0653  WBC 5.3 5.8   < > 8.8 10.0 9.7 7.0  NEUTROABS 3,525 3.9  --  7.6  --   --   --   HGB 10.8* 10.6*   < > 8.6* 9.3* 5.6* 7.9*  HCT 34.3* 34.1*   < > 27.1* 30.6* 17.4* 23.9*  MCV 81.1 82.6   < > 80.9 85.5 80.2 81.0  PLT 174 177   < > 141* 164 105* 114*   < > = values in this interval not displayed.   Lab Results  Component Value Date   TSH 0.304 (L) 12/12/2022   No results found for: "HGBA1C" Lab Results  Component Value Date   CHOL 195 03/08/2022   HDL 59 03/08/2022   LDLCALC 122  (H) 03/08/2022   TRIG 57 03/08/2022   CHOLHDL 3.3 03/08/2022    Significant Diagnostic Results in last 30 days:  DG Knee Right Port  Result Date: 12/13/2022 CLINICAL DATA:  Postop EXAM: PORTABLE RIGHT KNEE - 1-2 VIEW COMPARISON:  CT from earlier the same day FINDINGS: Lateral plate and screw fixation of distal femoral shaft fracture, major fragments in near anatomic alignment. No evident complication. Moderate tricompartmental DJD as before. 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 IMPRESSION: Lateral plate and screw fixation of distal femoral shaft fracture, major fragments in near anatomic alignment. Electronically Signed   By: Lucrezia Europe M.D.   On: 12/13/2022 10:20   DG FEMUR, MIN 2 VIEWS RIGHT  Result Date: 12/13/2022 CLINICAL DATA:  ORIF distal right femur fracture. EXAM: RIGHT FEMUR 2 VIEWS; DG C-ARM 1-60 MIN-NO REPORT COMPARISON:  12/11/2022; right knee CT-12/11/2022 FLUOROSCOPY TIME: FLUOROSCOPY TIME 30 seconds (2.1 mGy) FINDINGS: Six spot intraoperative fluoroscopic images during sideplate fixation comminuted right distal femur fracture are provided for review. Completion images  demonstrate marked improved alignment of known distal comminuted fracture. There is expected subcutaneous emphysema about the operative site. No radiopaque foreign body. Redemonstrated calcified loose body within the superior patellofemoral joint space. Suspected moderate to severe tricompartmental degenerative change of the knee, incompletely evaluated. IMPRESSION: Post ORIF of comminuted right distal femur fracture without evidence of complication. Electronically Signed   By: Sandi Mariscal M.D.   On: 12/13/2022 09:19   DG C-Arm 1-60 Min-No Report  Result Date: 12/13/2022 Fluoroscopy was utilized by the requesting physician.  No radiographic interpretation.   CT Knee Right Wo Contrast  Result Date: 12/12/2022 CLINICAL DATA:  Distal right femoral  fracture with comminution. EXAM: CT OF THE RIGHT KNEE WITHOUT CONTRAST TECHNIQUE: Multidetector CT imaging of the right knee was performed according to the standard protocol. Multiplanar CT image reconstructions were also generated. RADIATION DOSE REDUCTION: This exam was performed according to the departmental dose-optimization program which includes automated exposure control, adjustment of the mA and/or kV according to patient size and/or use of iterative reconstruction technique. COMPARISON:  Right knee and femoral series earlier today. FINDINGS: Bones/Joint/Cartilage Diffuse osteopenia. Again noted is a comminuted diametaphyseal distal femoral fracture with impaction at the fracture site. There is a butterfly comminution fragment of the medial aspect of the distal fragment of the distal femoral metaphysis, slightly displaced and additional smaller comminution fragments between fracture margins. The main distal fragment is displaced posteriorly up to 1/2 of a shaft width, maybe only approximately 1/3 of the shaft width, with mild angulation towards the medial aspect and moderate dorsal angulation of the distal fragment. The fracture does not extend into the condyles. There has no proximal tibial or fibular fracture. Background advanced tricompartmental degenerative arthrosis with bulky marginal osteophytes and with juxta-articular sclerosis and microcystic change along the femorotibial articulation. There is mild spurring of the proximal tibiofibular joint. There is a moderate heterogeneous hemarthrosis, most of which is suprapatellar. There is a 2.9 cm popcorn like calcification in the lateral gutter of the suprapatellar bursal space, additional small subcentimeter loose bodies medially, findings consistent with synovial osteochondromatosis. Ligaments Suboptimally assessed by CT. Muscles and Tendons Not well seen with this technique. The extensor mechanism is grossly intact. Partial fatty atrophy in the dorsal  thigh compartment is noted, mild atrophy in the foreleg musculature. No visible intramuscular hematoma or soft tissue gas. Soft tissues Diffuse superficial edema. No soft tissue gas is seen. No soft tissue mass. Other: Partially visible left knee arthroplasty. IMPRESSION: 1. Comminuted diametaphyseal distal femoral fracture with impaction at the fracture site, displacement and angulation as described above. 2. Moderate heterogeneous hemarthrosis. 3. Osteopenia and degenerative change. 4. Diffuse superficial edema. 5. 2.9 cm popcorn like calcification in the lateral gutter of the suprapatellar bursal space, additional small subcentimeter loose bodies medially, findings consistent with synovial osteochondromatosis. Electronically Signed   By: Telford Nab M.D.   On: 12/12/2022 00:15   DG Femur Min 2 Views Right  Result Date: 12/11/2022 CLINICAL DATA:  [place], right leg tenderness EXAM: RIGHT FEMUR 2 VIEWS; RIGHT KNEE - 1-2 VIEW; DG HIP (WITH OR WITHOUT PELVIS) 2-3V RIGHT COMPARISON:  None Available. FINDINGS: Right hip: Frontal view of the pelvis as well as frontal and cross-table lateral views of the right hip are obtained. Patient is rotated to the right during imaging. There are no acute displaced fractures. The hips are well aligned. Mild symmetrical bilateral hip osteoarthritis. Calcification within the pelvis consistent with calcified fibroid. Right femur: Frontal and cross-table lateral views of the right femur  are obtained. There is a comminuted impacted fracture of the distal right femoral metadiaphyseal junction. Ventral angulation at the fracture site. Overlying soft tissue swelling. Remaining portions of the right femur are unremarkable. Right knee: Frontal and cross-table lateral views are obtained. There is a comminuted impacted fracture of the distal right femoral metadiaphyseal junction, with ventral angulation at the fracture site. Fracture does not appear to extend into the femoral condyles or  joint space of the right knee. There is severe osteoarthritis. Synovial osteochondromatosis within the suprapatellar region. Diffuse soft tissue swelling. IMPRESSION: 1. Comminuted impacted fracture of the distal right femoral metadiaphyseal junction. 2. Severe soft tissue swelling surrounding the distal femur and right knee. 3. No other acute bony abnormalities. 4. 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 Electronically Signed   By: Randa Ngo M.D.   On: 12/11/2022 22:50   DG Hip Unilat W or Wo Pelvis 2-3 Views Right  Result Date: 12/11/2022 CLINICAL DATA:  [place], right leg tenderness EXAM: RIGHT FEMUR 2 VIEWS; RIGHT KNEE - 1-2 VIEW; DG HIP (WITH OR WITHOUT PELVIS) 2-3V RIGHT COMPARISON:  None Available. FINDINGS: Right hip: Frontal view of the pelvis as well as frontal and cross-table lateral views of the right hip are obtained. Patient is rotated to the right during imaging. There are no acute displaced fractures. The hips are well aligned. Mild symmetrical bilateral hip osteoarthritis. Calcification within the pelvis consistent with calcified fibroid. Right femur: Frontal and cross-table lateral views of the right femur are obtained. There is a comminuted impacted fracture of the distal right femoral metadiaphyseal junction. Ventral angulation at the fracture site. Overlying soft tissue swelling. Remaining portions of the right femur are unremarkable. Right knee: Frontal and cross-table lateral views are obtained. There is a comminuted impacted fracture of the distal right femoral metadiaphyseal junction, with ventral angulation at the fracture site. Fracture does not appear to extend into the femoral condyles or joint space of the right knee. There is severe osteoarthritis. Synovial osteochondromatosis within the suprapatellar region. Diffuse soft tissue swelling. IMPRESSION: 1. Comminuted impacted fracture of the distal right femoral metadiaphyseal junction. 2. Severe soft tissue swelling  surrounding the distal femur and right knee. 3. No other acute bony abnormalities. 4. 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 Electronically Signed   By: Randa Ngo M.D.   On: 12/11/2022 22:50   DG Knee 1-2 Views Right  Result Date: 12/11/2022 CLINICAL DATA:  [place], right leg tenderness EXAM: RIGHT FEMUR 2 VIEWS; RIGHT KNEE - 1-2 VIEW; DG HIP (WITH OR WITHOUT PELVIS) 2-3V RIGHT COMPARISON:  None Available. FINDINGS: Right hip: Frontal view of the pelvis as well as frontal and cross-table lateral views of the right hip are obtained. Patient is rotated to the right during imaging. There are no acute displaced fractures. The hips are well aligned. Mild symmetrical bilateral hip osteoarthritis. Calcification within the pelvis consistent with calcified fibroid. Right femur: Frontal and cross-table lateral views of the right femur are obtained. There is a comminuted impacted fracture of the distal right femoral metadiaphyseal junction. Ventral angulation at the fracture site. Overlying soft tissue swelling. Remaining portions of the right femur are unremarkable. Right knee: Frontal and cross-table lateral views are obtained. There is a comminuted impacted fracture of the distal right femoral metadiaphyseal junction, with ventral angulation at the fracture site. Fracture does not appear to extend into the femoral condyles or joint space of the right knee. There is severe osteoarthritis. Synovial osteochondromatosis within the suprapatellar region. Diffuse soft  tissue swelling. IMPRESSION: 1. Comminuted impacted fracture of the distal right femoral metadiaphyseal junction. 2. Severe soft tissue swelling surrounding the distal femur and right knee. 3. No other acute bony abnormalities. 4. 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 Electronically Signed   By: Randa Ngo M.D.   On: 12/11/2022 22:50   CT Cervical Spine Wo Contrast  Result Date: 12/11/2022 CLINICAL DATA:  [place], right knee  pain EXAM: CT CERVICAL SPINE WITHOUT CONTRAST TECHNIQUE: Multidetector CT imaging of the cervical spine was performed without intravenous contrast. Multiplanar CT image reconstructions were also generated. RADIATION DOSE REDUCTION: This exam was performed according to the departmental dose-optimization program which includes automated exposure control, adjustment of the mA and/or kV according to patient size and/or use of iterative reconstruction technique. COMPARISON:  None Available. FINDINGS: Alignment: Straightening of the cervical spine due to extensive spondylosis from C3 through C5. Otherwise alignment is anatomic. Skull base and vertebrae: No acute fracture. No primary bone lesion or focal pathologic process. Soft tissues and spinal canal: No prevertebral fluid or swelling. No visible canal hematoma. Disc levels: There is bony fusion across the disc spaces and facet joints from C3 through C5. Prominent spondylosis at C5-6 and C6-7. Upper chest: Airway is patent.  Lung apices are clear. Other: Reconstructed images demonstrate no additional findings. IMPRESSION: 1. No acute cervical spine fracture. 2. Extensive multilevel spondylosis and facet hypertrophy. Electronically Signed   By: Randa Ngo M.D.   On: 12/11/2022 22:20   CT Head Wo Contrast  Result Date: 12/11/2022 CLINICAL DATA:  [place], right knee pain EXAM: CT HEAD WITHOUT CONTRAST TECHNIQUE: Contiguous axial images were obtained from the base of the skull through the vertex without intravenous contrast. RADIATION DOSE REDUCTION: This exam was performed according to the departmental dose-optimization program which includes automated exposure control, adjustment of the mA and/or kV according to patient size and/or use of iterative reconstruction technique. COMPARISON:  07/30/2022 FINDINGS: Brain: No acute infarct or hemorrhage. Lateral ventricles and midline structures are unremarkable. No acute extra-axial fluid collections. No mass effect.  Vascular: No hyperdense vessel or unexpected calcification. Skull: Normal. Negative for fracture or focal lesion. Sinuses/Orbits: Mild ethmoid sinus mucosal thickening. Remaining paranasal sinuses are clear. Other: None. IMPRESSION: 1. No acute intracranial process. Electronically Signed   By: Randa Ngo M.D.   On: 12/11/2022 22:18    Assessment/Plan  1. Unsteady gait Fall and safety precaution advised - Encouraged to use walker - PT/OT as ordered post hospitalization.  2. Right hip pain Continue on oxycodone  Continue on miralax 3. Essential hypertension B/p stable  Continue on losartan  - CBC with Differential/Platelet - Basic metabolic panel  Family/ staff Communication: Reviewed plan of care with patient verbalized understanding   Labs/tests ordered:  - CBC with Differential/Platelet - Basic metabolic panel  Next Appointment: Return if symptoms worsen or fail to improve.   Sandrea Hughs, NP

## 2023-01-06 DIAGNOSIS — G3184 Mild cognitive impairment, so stated: Secondary | ICD-10-CM | POA: Diagnosis not present

## 2023-01-06 DIAGNOSIS — I10 Essential (primary) hypertension: Secondary | ICD-10-CM | POA: Diagnosis not present

## 2023-01-06 DIAGNOSIS — M47812 Spondylosis without myelopathy or radiculopathy, cervical region: Secondary | ICD-10-CM | POA: Diagnosis not present

## 2023-01-06 DIAGNOSIS — D649 Anemia, unspecified: Secondary | ICD-10-CM | POA: Diagnosis not present

## 2023-01-06 DIAGNOSIS — Z853 Personal history of malignant neoplasm of breast: Secondary | ICD-10-CM | POA: Diagnosis not present

## 2023-01-06 DIAGNOSIS — K5900 Constipation, unspecified: Secondary | ICD-10-CM | POA: Diagnosis not present

## 2023-01-06 DIAGNOSIS — M1990 Unspecified osteoarthritis, unspecified site: Secondary | ICD-10-CM | POA: Diagnosis not present

## 2023-01-06 DIAGNOSIS — Z7901 Long term (current) use of anticoagulants: Secondary | ICD-10-CM | POA: Diagnosis not present

## 2023-01-06 DIAGNOSIS — S72491D Other fracture of lower end of right femur, subsequent encounter for closed fracture with routine healing: Secondary | ICD-10-CM | POA: Diagnosis not present

## 2023-01-06 LAB — BASIC METABOLIC PANEL
BUN/Creatinine Ratio: 21 (calc) (ref 6–22)
BUN: 21 mg/dL (ref 7–25)
CO2: 22 mmol/L (ref 20–32)
Calcium: 9.7 mg/dL (ref 8.6–10.4)
Chloride: 108 mmol/L (ref 98–110)
Creat: 1.02 mg/dL — ABNORMAL HIGH (ref 0.60–0.95)
Glucose, Bld: 88 mg/dL (ref 65–99)
Potassium: 4.4 mmol/L (ref 3.5–5.3)
Sodium: 140 mmol/L (ref 135–146)

## 2023-01-06 LAB — CBC WITH DIFFERENTIAL/PLATELET
Absolute Monocytes: 340 cells/uL (ref 200–950)
Basophils Absolute: 70 cells/uL (ref 0–200)
Basophils Relative: 1.4 %
Eosinophils Absolute: 100 cells/uL (ref 15–500)
Eosinophils Relative: 2 %
HCT: 30.5 % — ABNORMAL LOW (ref 35.0–45.0)
Hemoglobin: 9.5 g/dL — ABNORMAL LOW (ref 11.7–15.5)
Lymphs Abs: 1190 cells/uL (ref 850–3900)
MCH: 26.7 pg — ABNORMAL LOW (ref 27.0–33.0)
MCHC: 31.1 g/dL — ABNORMAL LOW (ref 32.0–36.0)
MCV: 85.7 fL (ref 80.0–100.0)
MPV: 10.7 fL (ref 7.5–12.5)
Monocytes Relative: 6.8 %
Neutro Abs: 3300 cells/uL (ref 1500–7800)
Neutrophils Relative %: 66 %
Platelets: 245 10*3/uL (ref 140–400)
RBC: 3.56 10*6/uL — ABNORMAL LOW (ref 3.80–5.10)
RDW: 14.9 % (ref 11.0–15.0)
Total Lymphocyte: 23.8 %
WBC: 5 10*3/uL (ref 3.8–10.8)

## 2023-01-06 NOTE — Progress Notes (Signed)
This encounter was created in error - please disregard. Error

## 2023-01-11 ENCOUNTER — Other Ambulatory Visit: Payer: Self-pay

## 2023-01-11 ENCOUNTER — Telehealth: Payer: Self-pay

## 2023-01-11 DIAGNOSIS — I10 Essential (primary) hypertension: Secondary | ICD-10-CM | POA: Diagnosis not present

## 2023-01-11 DIAGNOSIS — K5900 Constipation, unspecified: Secondary | ICD-10-CM | POA: Diagnosis not present

## 2023-01-11 DIAGNOSIS — G3184 Mild cognitive impairment, so stated: Secondary | ICD-10-CM | POA: Diagnosis not present

## 2023-01-11 DIAGNOSIS — M25551 Pain in right hip: Secondary | ICD-10-CM

## 2023-01-11 DIAGNOSIS — D649 Anemia, unspecified: Secondary | ICD-10-CM

## 2023-01-11 DIAGNOSIS — M1990 Unspecified osteoarthritis, unspecified site: Secondary | ICD-10-CM | POA: Diagnosis not present

## 2023-01-11 DIAGNOSIS — Z853 Personal history of malignant neoplasm of breast: Secondary | ICD-10-CM | POA: Diagnosis not present

## 2023-01-11 DIAGNOSIS — R2681 Unsteadiness on feet: Secondary | ICD-10-CM

## 2023-01-11 DIAGNOSIS — Z7901 Long term (current) use of anticoagulants: Secondary | ICD-10-CM | POA: Diagnosis not present

## 2023-01-11 DIAGNOSIS — M47812 Spondylosis without myelopathy or radiculopathy, cervical region: Secondary | ICD-10-CM | POA: Diagnosis not present

## 2023-01-11 DIAGNOSIS — S72491D Other fracture of lower end of right femur, subsequent encounter for closed fracture with routine healing: Secondary | ICD-10-CM | POA: Diagnosis not present

## 2023-01-11 MED ORDER — IRON (FERROUS SULFATE) 325 (65 FE) MG PO TABS: 325.0000 mg | ORAL_TABLET | Freq: Every day | ORAL | 3 refills | Status: DC

## 2023-01-11 NOTE — Telephone Encounter (Signed)
Order faxed as requested

## 2023-01-11 NOTE — Telephone Encounter (Signed)
I was discussing recent lab results with patients daughter and Lynelle Smoke requested an order for a lift chair to be sent to her via fax at [phone].  [name] requested that we use a cover sheet and put attention Ilda Mori.   Order is pending for provider to review, associate diagnosis, and sign.

## 2023-01-11 NOTE — Telephone Encounter (Signed)
Lift chair ordered.Given to Rodena Piety to fax to patient's daughter as requested.

## 2023-01-13 DIAGNOSIS — K5900 Constipation, unspecified: Secondary | ICD-10-CM | POA: Diagnosis not present

## 2023-01-13 DIAGNOSIS — M47812 Spondylosis without myelopathy or radiculopathy, cervical region: Secondary | ICD-10-CM | POA: Diagnosis not present

## 2023-01-13 DIAGNOSIS — M1990 Unspecified osteoarthritis, unspecified site: Secondary | ICD-10-CM | POA: Diagnosis not present

## 2023-01-13 DIAGNOSIS — Z853 Personal history of malignant neoplasm of breast: Secondary | ICD-10-CM | POA: Diagnosis not present

## 2023-01-13 DIAGNOSIS — Z7901 Long term (current) use of anticoagulants: Secondary | ICD-10-CM | POA: Diagnosis not present

## 2023-01-13 DIAGNOSIS — G3184 Mild cognitive impairment, so stated: Secondary | ICD-10-CM | POA: Diagnosis not present

## 2023-01-13 DIAGNOSIS — D649 Anemia, unspecified: Secondary | ICD-10-CM | POA: Diagnosis not present

## 2023-01-13 DIAGNOSIS — I10 Essential (primary) hypertension: Secondary | ICD-10-CM | POA: Diagnosis not present

## 2023-01-13 DIAGNOSIS — S72491D Other fracture of lower end of right femur, subsequent encounter for closed fracture with routine healing: Secondary | ICD-10-CM | POA: Diagnosis not present

## 2023-01-18 DIAGNOSIS — I10 Essential (primary) hypertension: Secondary | ICD-10-CM | POA: Diagnosis not present

## 2023-01-18 DIAGNOSIS — G3184 Mild cognitive impairment, so stated: Secondary | ICD-10-CM | POA: Diagnosis not present

## 2023-01-18 DIAGNOSIS — K5900 Constipation, unspecified: Secondary | ICD-10-CM | POA: Diagnosis not present

## 2023-01-18 DIAGNOSIS — D649 Anemia, unspecified: Secondary | ICD-10-CM | POA: Diagnosis not present

## 2023-01-18 DIAGNOSIS — M1990 Unspecified osteoarthritis, unspecified site: Secondary | ICD-10-CM | POA: Diagnosis not present

## 2023-01-18 DIAGNOSIS — Z853 Personal history of malignant neoplasm of breast: Secondary | ICD-10-CM | POA: Diagnosis not present

## 2023-01-18 DIAGNOSIS — S72491D Other fracture of lower end of right femur, subsequent encounter for closed fracture with routine healing: Secondary | ICD-10-CM | POA: Diagnosis not present

## 2023-01-18 DIAGNOSIS — Z7901 Long term (current) use of anticoagulants: Secondary | ICD-10-CM | POA: Diagnosis not present

## 2023-01-18 DIAGNOSIS — M47812 Spondylosis without myelopathy or radiculopathy, cervical region: Secondary | ICD-10-CM | POA: Diagnosis not present

## 2023-01-19 DIAGNOSIS — N1831 Chronic kidney disease, stage 3a: Secondary | ICD-10-CM | POA: Diagnosis not present

## 2023-01-19 DIAGNOSIS — I10 Essential (primary) hypertension: Secondary | ICD-10-CM | POA: Diagnosis not present

## 2023-01-20 DIAGNOSIS — M47812 Spondylosis without myelopathy or radiculopathy, cervical region: Secondary | ICD-10-CM | POA: Diagnosis not present

## 2023-01-20 DIAGNOSIS — M1990 Unspecified osteoarthritis, unspecified site: Secondary | ICD-10-CM | POA: Diagnosis not present

## 2023-01-20 DIAGNOSIS — G3184 Mild cognitive impairment, so stated: Secondary | ICD-10-CM | POA: Diagnosis not present

## 2023-01-20 DIAGNOSIS — D649 Anemia, unspecified: Secondary | ICD-10-CM | POA: Diagnosis not present

## 2023-01-20 DIAGNOSIS — Z853 Personal history of malignant neoplasm of breast: Secondary | ICD-10-CM | POA: Diagnosis not present

## 2023-01-20 DIAGNOSIS — I10 Essential (primary) hypertension: Secondary | ICD-10-CM | POA: Diagnosis not present

## 2023-01-20 DIAGNOSIS — S72491D Other fracture of lower end of right femur, subsequent encounter for closed fracture with routine healing: Secondary | ICD-10-CM | POA: Diagnosis not present

## 2023-01-20 DIAGNOSIS — K5900 Constipation, unspecified: Secondary | ICD-10-CM | POA: Diagnosis not present

## 2023-01-20 DIAGNOSIS — Z7901 Long term (current) use of anticoagulants: Secondary | ICD-10-CM | POA: Diagnosis not present

## 2023-01-24 DIAGNOSIS — Z853 Personal history of malignant neoplasm of breast: Secondary | ICD-10-CM | POA: Diagnosis not present

## 2023-01-24 DIAGNOSIS — M47812 Spondylosis without myelopathy or radiculopathy, cervical region: Secondary | ICD-10-CM | POA: Diagnosis not present

## 2023-01-24 DIAGNOSIS — K5900 Constipation, unspecified: Secondary | ICD-10-CM | POA: Diagnosis not present

## 2023-01-24 DIAGNOSIS — D649 Anemia, unspecified: Secondary | ICD-10-CM | POA: Diagnosis not present

## 2023-01-24 DIAGNOSIS — I10 Essential (primary) hypertension: Secondary | ICD-10-CM | POA: Diagnosis not present

## 2023-01-24 DIAGNOSIS — S72491D Other fracture of lower end of right femur, subsequent encounter for closed fracture with routine healing: Secondary | ICD-10-CM | POA: Diagnosis not present

## 2023-01-24 DIAGNOSIS — M1990 Unspecified osteoarthritis, unspecified site: Secondary | ICD-10-CM | POA: Diagnosis not present

## 2023-01-24 DIAGNOSIS — G3184 Mild cognitive impairment, so stated: Secondary | ICD-10-CM | POA: Diagnosis not present

## 2023-01-24 DIAGNOSIS — Z7901 Long term (current) use of anticoagulants: Secondary | ICD-10-CM | POA: Diagnosis not present

## 2023-01-25 DIAGNOSIS — M47812 Spondylosis without myelopathy or radiculopathy, cervical region: Secondary | ICD-10-CM | POA: Diagnosis not present

## 2023-01-25 DIAGNOSIS — Z7901 Long term (current) use of anticoagulants: Secondary | ICD-10-CM | POA: Diagnosis not present

## 2023-01-25 DIAGNOSIS — G3184 Mild cognitive impairment, so stated: Secondary | ICD-10-CM | POA: Diagnosis not present

## 2023-01-25 DIAGNOSIS — I10 Essential (primary) hypertension: Secondary | ICD-10-CM | POA: Diagnosis not present

## 2023-01-25 DIAGNOSIS — S72451D Displaced supracondylar fracture without intracondylar extension of lower end of right femur, subsequent encounter for closed fracture with routine healing: Secondary | ICD-10-CM | POA: Diagnosis not present

## 2023-01-25 DIAGNOSIS — K5900 Constipation, unspecified: Secondary | ICD-10-CM | POA: Diagnosis not present

## 2023-01-25 DIAGNOSIS — Z853 Personal history of malignant neoplasm of breast: Secondary | ICD-10-CM | POA: Diagnosis not present

## 2023-01-25 DIAGNOSIS — M1990 Unspecified osteoarthritis, unspecified site: Secondary | ICD-10-CM | POA: Diagnosis not present

## 2023-01-25 DIAGNOSIS — S72491D Other fracture of lower end of right femur, subsequent encounter for closed fracture with routine healing: Secondary | ICD-10-CM | POA: Diagnosis not present

## 2023-01-25 DIAGNOSIS — D649 Anemia, unspecified: Secondary | ICD-10-CM | POA: Diagnosis not present

## 2023-01-27 DIAGNOSIS — Z853 Personal history of malignant neoplasm of breast: Secondary | ICD-10-CM | POA: Diagnosis not present

## 2023-01-27 DIAGNOSIS — S72491D Other fracture of lower end of right femur, subsequent encounter for closed fracture with routine healing: Secondary | ICD-10-CM | POA: Diagnosis not present

## 2023-01-27 DIAGNOSIS — M47812 Spondylosis without myelopathy or radiculopathy, cervical region: Secondary | ICD-10-CM | POA: Diagnosis not present

## 2023-01-27 DIAGNOSIS — D649 Anemia, unspecified: Secondary | ICD-10-CM | POA: Diagnosis not present

## 2023-01-27 DIAGNOSIS — G3184 Mild cognitive impairment, so stated: Secondary | ICD-10-CM | POA: Diagnosis not present

## 2023-01-27 DIAGNOSIS — I10 Essential (primary) hypertension: Secondary | ICD-10-CM | POA: Diagnosis not present

## 2023-01-27 DIAGNOSIS — Z7901 Long term (current) use of anticoagulants: Secondary | ICD-10-CM | POA: Diagnosis not present

## 2023-01-27 DIAGNOSIS — M1990 Unspecified osteoarthritis, unspecified site: Secondary | ICD-10-CM | POA: Diagnosis not present

## 2023-01-27 DIAGNOSIS — K5900 Constipation, unspecified: Secondary | ICD-10-CM | POA: Diagnosis not present

## 2023-02-01 DIAGNOSIS — G3184 Mild cognitive impairment, so stated: Secondary | ICD-10-CM | POA: Diagnosis not present

## 2023-02-01 DIAGNOSIS — M47812 Spondylosis without myelopathy or radiculopathy, cervical region: Secondary | ICD-10-CM | POA: Diagnosis not present

## 2023-02-01 DIAGNOSIS — I10 Essential (primary) hypertension: Secondary | ICD-10-CM | POA: Diagnosis not present

## 2023-02-01 DIAGNOSIS — Z7901 Long term (current) use of anticoagulants: Secondary | ICD-10-CM | POA: Diagnosis not present

## 2023-02-01 DIAGNOSIS — K5900 Constipation, unspecified: Secondary | ICD-10-CM | POA: Diagnosis not present

## 2023-02-01 DIAGNOSIS — S72491D Other fracture of lower end of right femur, subsequent encounter for closed fracture with routine healing: Secondary | ICD-10-CM | POA: Diagnosis not present

## 2023-02-01 DIAGNOSIS — Z853 Personal history of malignant neoplasm of breast: Secondary | ICD-10-CM | POA: Diagnosis not present

## 2023-02-01 DIAGNOSIS — M1990 Unspecified osteoarthritis, unspecified site: Secondary | ICD-10-CM | POA: Diagnosis not present

## 2023-02-01 DIAGNOSIS — D649 Anemia, unspecified: Secondary | ICD-10-CM | POA: Diagnosis not present

## 2023-02-03 DIAGNOSIS — Z853 Personal history of malignant neoplasm of breast: Secondary | ICD-10-CM | POA: Diagnosis not present

## 2023-02-03 DIAGNOSIS — I10 Essential (primary) hypertension: Secondary | ICD-10-CM | POA: Diagnosis not present

## 2023-02-03 DIAGNOSIS — D649 Anemia, unspecified: Secondary | ICD-10-CM | POA: Diagnosis not present

## 2023-02-03 DIAGNOSIS — M47812 Spondylosis without myelopathy or radiculopathy, cervical region: Secondary | ICD-10-CM | POA: Diagnosis not present

## 2023-02-03 DIAGNOSIS — Z7901 Long term (current) use of anticoagulants: Secondary | ICD-10-CM | POA: Diagnosis not present

## 2023-02-03 DIAGNOSIS — K5900 Constipation, unspecified: Secondary | ICD-10-CM | POA: Diagnosis not present

## 2023-02-03 DIAGNOSIS — M1990 Unspecified osteoarthritis, unspecified site: Secondary | ICD-10-CM | POA: Diagnosis not present

## 2023-02-03 DIAGNOSIS — G3184 Mild cognitive impairment, so stated: Secondary | ICD-10-CM | POA: Diagnosis not present

## 2023-02-03 DIAGNOSIS — S72491D Other fracture of lower end of right femur, subsequent encounter for closed fracture with routine healing: Secondary | ICD-10-CM | POA: Diagnosis not present

## 2023-02-04 DIAGNOSIS — M1990 Unspecified osteoarthritis, unspecified site: Secondary | ICD-10-CM | POA: Diagnosis not present

## 2023-02-04 DIAGNOSIS — I10 Essential (primary) hypertension: Secondary | ICD-10-CM | POA: Diagnosis not present

## 2023-02-04 DIAGNOSIS — D649 Anemia, unspecified: Secondary | ICD-10-CM | POA: Diagnosis not present

## 2023-02-04 DIAGNOSIS — K5900 Constipation, unspecified: Secondary | ICD-10-CM | POA: Diagnosis not present

## 2023-02-04 DIAGNOSIS — G3184 Mild cognitive impairment, so stated: Secondary | ICD-10-CM | POA: Diagnosis not present

## 2023-02-04 DIAGNOSIS — S72491D Other fracture of lower end of right femur, subsequent encounter for closed fracture with routine healing: Secondary | ICD-10-CM | POA: Diagnosis not present

## 2023-02-04 DIAGNOSIS — Z853 Personal history of malignant neoplasm of breast: Secondary | ICD-10-CM | POA: Diagnosis not present

## 2023-02-04 DIAGNOSIS — M47812 Spondylosis without myelopathy or radiculopathy, cervical region: Secondary | ICD-10-CM | POA: Diagnosis not present

## 2023-02-04 DIAGNOSIS — Z7901 Long term (current) use of anticoagulants: Secondary | ICD-10-CM | POA: Diagnosis not present

## 2023-02-05 DIAGNOSIS — I10 Essential (primary) hypertension: Secondary | ICD-10-CM | POA: Diagnosis not present

## 2023-02-05 DIAGNOSIS — S72491D Other fracture of lower end of right femur, subsequent encounter for closed fracture with routine healing: Secondary | ICD-10-CM | POA: Diagnosis not present

## 2023-02-05 DIAGNOSIS — M47812 Spondylosis without myelopathy or radiculopathy, cervical region: Secondary | ICD-10-CM | POA: Diagnosis not present

## 2023-02-05 DIAGNOSIS — G3184 Mild cognitive impairment, so stated: Secondary | ICD-10-CM | POA: Diagnosis not present

## 2023-02-05 DIAGNOSIS — D649 Anemia, unspecified: Secondary | ICD-10-CM | POA: Diagnosis not present

## 2023-02-05 DIAGNOSIS — K5900 Constipation, unspecified: Secondary | ICD-10-CM | POA: Diagnosis not present

## 2023-02-05 DIAGNOSIS — M1990 Unspecified osteoarthritis, unspecified site: Secondary | ICD-10-CM | POA: Diagnosis not present

## 2023-02-05 DIAGNOSIS — Z853 Personal history of malignant neoplasm of breast: Secondary | ICD-10-CM | POA: Diagnosis not present

## 2023-02-05 DIAGNOSIS — Z7901 Long term (current) use of anticoagulants: Secondary | ICD-10-CM | POA: Diagnosis not present

## 2023-02-08 DIAGNOSIS — Z853 Personal history of malignant neoplasm of breast: Secondary | ICD-10-CM | POA: Diagnosis not present

## 2023-02-08 DIAGNOSIS — M1990 Unspecified osteoarthritis, unspecified site: Secondary | ICD-10-CM | POA: Diagnosis not present

## 2023-02-08 DIAGNOSIS — S72491D Other fracture of lower end of right femur, subsequent encounter for closed fracture with routine healing: Secondary | ICD-10-CM | POA: Diagnosis not present

## 2023-02-08 DIAGNOSIS — G3184 Mild cognitive impairment, so stated: Secondary | ICD-10-CM | POA: Diagnosis not present

## 2023-02-08 DIAGNOSIS — K5900 Constipation, unspecified: Secondary | ICD-10-CM | POA: Diagnosis not present

## 2023-02-08 DIAGNOSIS — D649 Anemia, unspecified: Secondary | ICD-10-CM | POA: Diagnosis not present

## 2023-02-08 DIAGNOSIS — I10 Essential (primary) hypertension: Secondary | ICD-10-CM | POA: Diagnosis not present

## 2023-02-08 DIAGNOSIS — Z7901 Long term (current) use of anticoagulants: Secondary | ICD-10-CM | POA: Diagnosis not present

## 2023-02-08 DIAGNOSIS — M47812 Spondylosis without myelopathy or radiculopathy, cervical region: Secondary | ICD-10-CM | POA: Diagnosis not present

## 2023-02-09 DIAGNOSIS — H04123 Dry eye syndrome of bilateral lacrimal glands: Secondary | ICD-10-CM | POA: Diagnosis not present

## 2023-02-09 DIAGNOSIS — H40013 Open angle with borderline findings, low risk, bilateral: Secondary | ICD-10-CM | POA: Diagnosis not present

## 2023-02-09 DIAGNOSIS — H524 Presbyopia: Secondary | ICD-10-CM | POA: Diagnosis not present

## 2023-02-15 ENCOUNTER — Ambulatory Visit: Payer: Medicare PPO | Admitting: Podiatry

## 2023-02-15 ENCOUNTER — Encounter: Payer: Self-pay | Admitting: Podiatry

## 2023-02-15 DIAGNOSIS — B351 Tinea unguium: Secondary | ICD-10-CM

## 2023-02-15 DIAGNOSIS — M79674 Pain in right toe(s): Secondary | ICD-10-CM

## 2023-02-15 DIAGNOSIS — M79675 Pain in left toe(s): Secondary | ICD-10-CM

## 2023-02-15 NOTE — Progress Notes (Unsigned)
  Subjective:  Patient ID: Tracy Faulkner, female    DOB: [birth year],  MRN: [id number]  Tracy Faulkner presents to clinic today for {jgcomplaint:23593}  Chief Complaint  Patient presents with   Nail Problem    [facility] PCP-[facility] PCP VST-2023   New problem(s): None. {jgcomplaint:23593}  PCP is Ngetich, Nelda Bucks, NP.  Allergies  Allergen Reactions   Ace Inhibitors Cough   Lortab [Hydrocodone-Acetaminophen] Other (See Comments)    Bad dreams   Remeron [Mirtazapine] Other (See Comments)    Hallucinations (can take 1/2 tablet--7.5 mg), not 15 mg   Penicillins Rash and Other (See Comments)    Throat swelling, Has patient had a PCN reaction causing immediate rash, facial/tongue/throat swelling, SOB or lightheadedness with hypotension: Yes Has patient had a PCN reaction causing severe rash involving mucus membranes or skin necrosis: No Has patient had a PCN reaction that required hospitalization No Has patient had a PCN reaction occurring within the last 10 years: No If all of the above answers are "NO", then may proceed with Cephalosporin use.     Review of Systems: Negative except as noted in the HPI.  Objective: No changes noted in today's physical examination. There were no vitals filed for this visit. Tracy Faulkner is a pleasant 89 y.o. female {jgbodyhabitus:24098} AAO x 3.  Vascular Capillary refill time to digits immediate b/l. Palpable DP pulse(s) b/l lower extremities. Palpable PT pulse(s) b/l lower extremities Pedal hair sparse. Lower extremity skin temperature gradient within normal limits. No cyanosis or clubbing noted.  Neurologic Normal speech. Oriented to person, place, and time. Protective sensation intact 5/5 intact bilaterally with 10g monofilament b/l.  Dermatologic Pedal skin with normal turgor, texture and tone b/l lower extremities. No open wounds b/l lower extremities. No interdigital macerations b/l lower extremities.   Toenails 1-5 b/l elongated, discolored,  dystrophic, thickened, crumbly with subungual debris and tenderness to dorsal palpation.   Much improved with resolving hyperkeratotic lesion(s) 1st metatarsal head left foot and resolved HKT posteromedial heel RLE.  No erythema, no edema, no drainage, no fluctuance.   Minimal porokeratotic lesion(s) submet head 1 right foot. No erythema, no edema, no drainage, no fluctuance.  Orthopedic: Normal muscle strength 5/5 to all lower extremity muscle groups bilaterally. Hallux valgus with bunion deformity noted b/l lower extremities. Hammertoe(s) noted to the 2-5 bilaterally. 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   Radiographs: None  Assessment/Plan: No diagnosis found.  No orders of the defined types were placed in this encounter.   None {Jgplan:23602::"-Patient/POA to call should there be question/concern in the interim."}   Return in about 3 months (around 05/18/2023).  Marzetta Board, DPM

## 2023-02-16 DIAGNOSIS — S72491D Other fracture of lower end of right femur, subsequent encounter for closed fracture with routine healing: Secondary | ICD-10-CM | POA: Diagnosis not present

## 2023-02-16 DIAGNOSIS — M1990 Unspecified osteoarthritis, unspecified site: Secondary | ICD-10-CM | POA: Diagnosis not present

## 2023-02-16 DIAGNOSIS — G3184 Mild cognitive impairment, so stated: Secondary | ICD-10-CM | POA: Diagnosis not present

## 2023-02-16 DIAGNOSIS — Z7901 Long term (current) use of anticoagulants: Secondary | ICD-10-CM | POA: Diagnosis not present

## 2023-02-16 DIAGNOSIS — K5900 Constipation, unspecified: Secondary | ICD-10-CM | POA: Diagnosis not present

## 2023-02-16 DIAGNOSIS — D649 Anemia, unspecified: Secondary | ICD-10-CM | POA: Diagnosis not present

## 2023-02-16 DIAGNOSIS — Z853 Personal history of malignant neoplasm of breast: Secondary | ICD-10-CM | POA: Diagnosis not present

## 2023-02-16 DIAGNOSIS — M47812 Spondylosis without myelopathy or radiculopathy, cervical region: Secondary | ICD-10-CM | POA: Diagnosis not present

## 2023-02-16 DIAGNOSIS — I10 Essential (primary) hypertension: Secondary | ICD-10-CM | POA: Diagnosis not present

## 2023-02-18 DIAGNOSIS — Z7901 Long term (current) use of anticoagulants: Secondary | ICD-10-CM | POA: Diagnosis not present

## 2023-02-18 DIAGNOSIS — K5900 Constipation, unspecified: Secondary | ICD-10-CM | POA: Diagnosis not present

## 2023-02-18 DIAGNOSIS — I10 Essential (primary) hypertension: Secondary | ICD-10-CM | POA: Diagnosis not present

## 2023-02-18 DIAGNOSIS — Z853 Personal history of malignant neoplasm of breast: Secondary | ICD-10-CM | POA: Diagnosis not present

## 2023-02-18 DIAGNOSIS — M47812 Spondylosis without myelopathy or radiculopathy, cervical region: Secondary | ICD-10-CM | POA: Diagnosis not present

## 2023-02-18 DIAGNOSIS — M1990 Unspecified osteoarthritis, unspecified site: Secondary | ICD-10-CM | POA: Diagnosis not present

## 2023-02-18 DIAGNOSIS — S72491D Other fracture of lower end of right femur, subsequent encounter for closed fracture with routine healing: Secondary | ICD-10-CM | POA: Diagnosis not present

## 2023-02-18 DIAGNOSIS — D649 Anemia, unspecified: Secondary | ICD-10-CM | POA: Diagnosis not present

## 2023-02-18 DIAGNOSIS — G3184 Mild cognitive impairment, so stated: Secondary | ICD-10-CM | POA: Diagnosis not present

## 2023-02-24 ENCOUNTER — Encounter: Payer: Self-pay | Admitting: Adult Health

## 2023-02-24 ENCOUNTER — Ambulatory Visit: Payer: Medicare PPO | Admitting: Adult Health

## 2023-02-24 VITALS — BP 128/88 | HR 71 | Temp 97.5°F | Resp 18 | Ht 64.5 in | Wt 150.5 lb

## 2023-02-24 DIAGNOSIS — S7291XS Unspecified fracture of right femur, sequela: Secondary | ICD-10-CM | POA: Diagnosis not present

## 2023-02-24 DIAGNOSIS — D649 Anemia, unspecified: Secondary | ICD-10-CM | POA: Diagnosis not present

## 2023-02-24 DIAGNOSIS — R350 Frequency of micturition: Secondary | ICD-10-CM | POA: Diagnosis not present

## 2023-02-24 DIAGNOSIS — I10 Essential (primary) hypertension: Secondary | ICD-10-CM | POA: Diagnosis not present

## 2023-02-24 DIAGNOSIS — Z7901 Long term (current) use of anticoagulants: Secondary | ICD-10-CM | POA: Diagnosis not present

## 2023-02-24 DIAGNOSIS — M1990 Unspecified osteoarthritis, unspecified site: Secondary | ICD-10-CM | POA: Diagnosis not present

## 2023-02-24 DIAGNOSIS — G3184 Mild cognitive impairment, so stated: Secondary | ICD-10-CM | POA: Diagnosis not present

## 2023-02-24 DIAGNOSIS — M47812 Spondylosis without myelopathy or radiculopathy, cervical region: Secondary | ICD-10-CM | POA: Diagnosis not present

## 2023-02-24 DIAGNOSIS — K5900 Constipation, unspecified: Secondary | ICD-10-CM | POA: Diagnosis not present

## 2023-02-24 DIAGNOSIS — Z853 Personal history of malignant neoplasm of breast: Secondary | ICD-10-CM | POA: Diagnosis not present

## 2023-02-24 DIAGNOSIS — S72491D Other fracture of lower end of right femur, subsequent encounter for closed fracture with routine healing: Secondary | ICD-10-CM | POA: Diagnosis not present

## 2023-02-24 LAB — POCT URINALYSIS DIPSTICK
Bilirubin, UA: 100
Glucose, UA: NEGATIVE
Leukocytes, UA: NEGATIVE
Nitrite, UA: NEGATIVE
Protein, UA: POSITIVE — AB
Spec Grav, UA: 1.025 (ref 1.010–1.025)
Urobilinogen, UA: 0.2 E.U./dL
pH, UA: 6.5 (ref 5.0–8.0)

## 2023-02-24 NOTE — Patient Instructions (Signed)

## 2023-02-24 NOTE — Progress Notes (Signed)
[facility]  Provider:  Durenda Age DNP  Code Status:  DNR  Goals of Care:     12/13/2022    7:51 AM  Advanced Directives  Does Patient Have a Medical Advance Directive? Yes  Type of Paramedic of [place];Living will  Copy of [place] in Chart? No - copy requested     Chief Complaint  Patient presents with   Acute Visit    UTI symptoms    HPI: Patient is a 89 y.o. female seen today for an acute visit for UTI symptoms. She has a PMH of urinary incontinence, hypertension and GERD. She was accompanied today by her daughter. She complains of increase urinary frequency and chills for a week now. She denies seeing blood in her urine nor fever. She was recently hospitalized 12/11/22 to 12/15/22 S/P fall while trying to get out of the car sustaining right comminuted right distal femur fracture for which she had ORIF on 12/13/22. She was put on Eliquis for DVT prophylaxis X 30 days. Per daughter, she has only 2 days left on Eliquis.  Today, urine dipstick showed  negative nitrate, negative leukocyte, trace blood and positive protein.   Wt Readings from Last 3 Encounters:  02/24/23 150 lb 8 oz (68.3 kg)  01/05/23 146 lb 9.6 oz (66.5 kg)  09/24/22 152 lb 3.2 oz (69 kg)     Past Medical History:  Diagnosis Date   Anemia    Arthritis    Ataxia    Back problem    Bowel trouble    Breast cancer (HCC)    Carpal tunnel syndrome    Constipation    Disturbance, sleep    Dyspnea    increased exertion    Frequent headaches    GI bleed    History of mammogram 2000   History of urinary tract infection    Hypertension    Obesity    Pain in both knees    Sepsis ([place])    Urinary incontinence     Past Surgical History:  Procedure Laterality Date   BREAST SURGERY     for cancer-right breast   COLON SURGERY     secondary to bowel blockages   EYE SURGERY     cataract surgery bilateral    gastric stapling     KNEE ARTHROPLASTY  Left 10/07/2016   Procedure: LEFT TOTAL KNEE ARTHROPLASTY WITH COMPUTER NAVIGATION;  Surgeon: Rod Can, MD;  Location: [facility];  Service: Orthopedics;  Laterality: Left;   ORIF FEMUR FRACTURE Right 12/13/2022   Procedure: OPEN REDUCTION INTERNAL FIXATION (ORIF) DISTAL FEMUR FRACTURE;  Surgeon: Shona Needles, MD;  Location: [facility];  Service: Orthopedics;  Laterality: Right;    Allergies  Allergen Reactions   Ace Inhibitors Cough   Lortab [Hydrocodone-Acetaminophen] Other (See Comments)    Bad dreams   Remeron [Mirtazapine] Other (See Comments)    Hallucinations (can take 1/2 tablet--7.5 mg), not 15 mg   Penicillins Rash and Other (See Comments)    Throat swelling, Has patient had a PCN reaction causing immediate rash, facial/tongue/throat swelling, SOB or lightheadedness with hypotension: Yes Has patient had a PCN reaction causing severe rash involving mucus membranes or skin necrosis: No Has patient had a PCN reaction that required hospitalization No Has patient had a PCN reaction occurring within the last 10 years: No If all of the above answers are "NO", then may proceed with Cephalosporin use.     Outpatient Encounter Medications as  of 02/24/2023  Medication Sig   ACETAMINOPHEN ER PO Take 500 mg by mouth daily as needed for pain.   bisacodyl (DULCOLAX) 5 MG EC tablet Take 1 tablet (5 mg total) by mouth daily as needed for moderate constipation.   CALCIUM PO Take 1 capsule by mouth daily.   cholecalciferol (CHOLECALCIFEROL) 25 MCG tablet Take 1 tablet (1,000 Units total) by mouth daily.   cycloSPORINE (RESTASIS) 0.05 % ophthalmic emulsion Place 1 drop into both eyes daily.   docusate sodium (COLACE) 100 MG capsule Take 1 capsule (100 mg total) by mouth 2 (two) times daily.   Iron, Ferrous Sulfate, 325 (65 Fe) MG TABS Take 325 mg by mouth daily. D64.9   losartan (COZAAR) 50 MG tablet Take 1 tablet (50 mg total) by mouth daily.   Multiple Vitamin (MULTIVITAMIN) tablet Take 1 tablet  by mouth daily.   Omega-3 Fatty Acids (FISH OIL PO) Take 1 capsule by mouth daily.   omeprazole (PRILOSEC) 40 MG capsule TAKE 1 CAPSULE BY MOUTH ONCE DAILY BEFORE BREAKFAST   OVER THE COUNTER MEDICATION Take 1 capsule by mouth daily. Gluco Gel Joint & Cartilage Support   OVER THE COUNTER MEDICATION Apply 1 application  topically daily as needed (pain relief). Youngevity Pain Cream   oxyCODONE (OXY IR/ROXICODONE) 5 MG immediate release tablet Take 1 tablet (5 mg total) by mouth every 6 (six) hours as needed for moderate pain or severe pain.   polyethylene glycol (MIRALAX / GLYCOLAX) 17 g packet Take 17 g by mouth daily as needed for mild constipation.   apixaban (ELIQUIS) 2.5 MG TABS tablet Take 1 tablet (2.5 mg total) by mouth 2 (two) times daily.   No facility-administered encounter medications on file as of 02/24/2023.    Review of Systems:  Review of Systems  Constitutional:  Positive for chills. Negative for appetite change, fatigue and fever.  HENT:  Negative for congestion, hearing loss, rhinorrhea and sore throat.   Eyes: Negative.   Respiratory:  Negative for cough, shortness of breath and wheezing.   Cardiovascular:  Negative for chest pain, palpitations and leg swelling.  Gastrointestinal:  Negative for abdominal pain, constipation, diarrhea, nausea and vomiting.  Genitourinary:  Positive for frequency and urgency. Negative for dysuria.  Musculoskeletal:  Negative for arthralgias, back pain and myalgias.  Skin:  Negative for color change, rash and wound.  Neurological:  Negative for dizziness, weakness and headaches.  Psychiatric/Behavioral:  Negative for behavioral problems. The patient is not nervous/anxious.     Health Maintenance  Topic Date Due   Medicare Annual Wellness (AWV)  09/03/2023   DTaP/Tdap/Td (2 - Td or Tdap) 10/12/2027   Pneumonia Vaccine 72+ Years old  Completed   INFLUENZA VACCINE  Completed   DEXA SCAN  Completed   Zoster Vaccines- Shingrix  Completed    HPV VACCINES  Aged Out   COVID-19 Vaccine  Discontinued    Physical Exam: Vitals:   02/24/23 1031  BP: 128/88  Pulse: 71  Resp: 18  Temp: (!) 97.5 F (36.4 C)  SpO2: 96%  Weight: 150 lb 8 oz (68.3 kg)  Height: 5' 4.5" (1.638 m)   Body mass index is 25.43 kg/m. Physical Exam Constitutional:      Appearance: Normal appearance.  HENT:     Head: Normocephalic and atraumatic.     Nose: Nose normal.     Mouth/Throat:     Mouth: Mucous membranes are moist.  Eyes:     Conjunctiva/sclera: Conjunctivae normal.  Cardiovascular:  Rate and Rhythm: Normal rate and regular rhythm.  Pulmonary:     Effort: Pulmonary effort is normal.     Breath sounds: Normal breath sounds.  Abdominal:     General: Bowel sounds are normal.     Palpations: Abdomen is soft.  Musculoskeletal:        General: Swelling present. Normal range of motion.     Cervical back: Normal range of motion.     Right lower leg: Edema present.     Left lower leg: Edema present.     Comments: BLE 2-3+edema  Skin:    General: Skin is warm and dry.  Neurological:     General: No focal deficit present.     Mental Status: She is alert and oriented to person, place, and time.  Psychiatric:        Mood and Affect: Mood normal.        Behavior: Behavior normal.        Thought Content: Thought content normal.        Judgment: Judgment normal.     Labs reviewed: Basic Metabolic Panel: Recent Labs    03/08/22 1146 06/16/22 1548 12/12/22 0731 12/13/22 0011 12/14/22 0657 12/15/22 0653 01/05/23 1552  NA 143   < >  --  147* 138 139 140  K 4.3   < >  --  4.3 4.1 3.7 4.4  CL 112*   < >  --  113* 107 111 108  CO2 25   < >  --  23 24 24 22   GLUCOSE 88   < >  --  66* 93 81 88  BUN 25   < >  --  22 26* 23 21  CREATININE 1.05*   < >  --  0.96 1.17* 0.90 1.02*  CALCIUM 9.5   < >  --  9.5 8.2* 8.3* 9.7  MG  --    < >  --  2.2 1.9 2.0  --   TSH 1.00  --  0.304*  --   --   --   --    < > = values in this interval  not displayed.   Liver Function Tests: Recent Labs    03/08/22 1146 07/30/22 0626 12/12/22 0207  AST 26 28 30   ALT 19 18 24   ALKPHOS  --  60 55  BILITOT 0.7 0.9 0.4  PROT 7.0 7.0 5.8*  ALBUMIN  --  3.5 3.0*   No results for input(s): "LIPASE", "AMYLASE" in the last 8760 hours. No results for input(s): "AMMONIA" in the last 8760 hours. CBC: Recent Labs    07/30/22 0626 07/31/22 0108 12/12/22 0207 12/13/22 0011 12/14/22 0657 12/15/22 0653 01/05/23 1552  WBC 5.8   < > 8.8   < > 9.7 7.0 5.0  NEUTROABS 3.9  --  7.6  --   --   --  3,300  HGB 10.6*   < > 8.6*   < > 5.6* 7.9* 9.5*  HCT 34.1*   < > 27.1*   < > 17.4* 23.9* 30.5*  MCV 82.6   < > 80.9   < > 80.2 81.0 85.7  PLT 177   < > 141*   < > 105* 114* 245   < > = values in this interval not displayed.   Lipid Panel: Recent Labs    03/08/22 1146  CHOL 195  HDL 59  LDLCALC 122*  TRIG 57  CHOLHDL 3.3   No results found for: "HGBA1C"  Procedures since last visit: No results found.  Assessment/Plan  1. Urination frequency - POC Urinalysis Dipstick was negative nitrate, negative leukocyte, trace blood and positive protein. -  Urine Culture  2. Closed fracture of right femur, unspecified fracture morphology, unspecified portion of femur, sequela -  S/P ORIF on 12/13/22 -  continue Eliquis X 2 more days -  follow up with orthopedics  3. Primary hypertension -  BP 128/88 -  continue Cozaar      Labs/tests ordered: urine dipstick POC and urine culture  Next appt:  03/28/2023

## 2023-02-25 LAB — URINE CULTURE
MICRO NUMBER:: 14755964
SPECIMEN QUALITY:: ADEQUATE

## 2023-03-02 ENCOUNTER — Other Ambulatory Visit: Payer: Self-pay | Admitting: Adult Health

## 2023-03-02 DIAGNOSIS — S72491D Other fracture of lower end of right femur, subsequent encounter for closed fracture with routine healing: Secondary | ICD-10-CM | POA: Diagnosis not present

## 2023-03-02 DIAGNOSIS — Z853 Personal history of malignant neoplasm of breast: Secondary | ICD-10-CM | POA: Diagnosis not present

## 2023-03-02 DIAGNOSIS — Z7901 Long term (current) use of anticoagulants: Secondary | ICD-10-CM | POA: Diagnosis not present

## 2023-03-02 DIAGNOSIS — M47812 Spondylosis without myelopathy or radiculopathy, cervical region: Secondary | ICD-10-CM | POA: Diagnosis not present

## 2023-03-02 DIAGNOSIS — R350 Frequency of micturition: Secondary | ICD-10-CM

## 2023-03-02 DIAGNOSIS — K5900 Constipation, unspecified: Secondary | ICD-10-CM | POA: Diagnosis not present

## 2023-03-02 DIAGNOSIS — D649 Anemia, unspecified: Secondary | ICD-10-CM | POA: Diagnosis not present

## 2023-03-02 DIAGNOSIS — M1990 Unspecified osteoarthritis, unspecified site: Secondary | ICD-10-CM | POA: Diagnosis not present

## 2023-03-02 DIAGNOSIS — I10 Essential (primary) hypertension: Secondary | ICD-10-CM | POA: Diagnosis not present

## 2023-03-02 DIAGNOSIS — G3184 Mild cognitive impairment, so stated: Secondary | ICD-10-CM | POA: Diagnosis not present

## 2023-03-02 NOTE — Progress Notes (Signed)
-    urine culture showed mixed flora, possibly due to contamination. If still not feeling better,may need to repeat urine culture.

## 2023-03-27 ENCOUNTER — Emergency Department (HOSPITAL_COMMUNITY): Payer: Medicare PPO

## 2023-03-27 ENCOUNTER — Other Ambulatory Visit: Payer: Self-pay

## 2023-03-27 ENCOUNTER — Emergency Department (HOSPITAL_COMMUNITY)
Admission: EM | Admit: 2023-03-27 | Discharge: 2023-03-27 | Disposition: A | Discharge status: Home / Self Care | Payer: Medicare PPO | Attending: Emergency Medicine | Admitting: Emergency Medicine

## 2023-03-27 DIAGNOSIS — N3000 Acute cystitis without hematuria: Secondary | ICD-10-CM

## 2023-03-27 DIAGNOSIS — M25512 Pain in left shoulder: Secondary | ICD-10-CM | POA: Diagnosis not present

## 2023-03-27 DIAGNOSIS — R410 Disorientation, unspecified: Secondary | ICD-10-CM | POA: Diagnosis not present

## 2023-03-27 DIAGNOSIS — S199XXA Unspecified injury of neck, initial encounter: Secondary | ICD-10-CM | POA: Diagnosis not present

## 2023-03-27 DIAGNOSIS — S0990XA Unspecified injury of head, initial encounter: Secondary | ICD-10-CM | POA: Diagnosis not present

## 2023-03-27 DIAGNOSIS — I10 Essential (primary) hypertension: Secondary | ICD-10-CM | POA: Diagnosis not present

## 2023-03-27 DIAGNOSIS — W19XXXA Unspecified fall, initial encounter: Secondary | ICD-10-CM | POA: Diagnosis not present

## 2023-03-27 DIAGNOSIS — Z043 Encounter for examination and observation following other accident: Secondary | ICD-10-CM | POA: Diagnosis not present

## 2023-03-27 LAB — URINALYSIS, ROUTINE W REFLEX MICROSCOPIC
Bilirubin Urine: NEGATIVE
Glucose, UA: NEGATIVE mg/dL
Hgb urine dipstick: NEGATIVE
Ketones, ur: NEGATIVE mg/dL
Nitrite: NEGATIVE
Protein, ur: NEGATIVE mg/dL
Specific Gravity, Urine: 1.008 (ref 1.005–1.030)
pH: 7 (ref 5.0–8.0)

## 2023-03-27 LAB — COMPREHENSIVE METABOLIC PANEL
ALT: 17 U/L (ref 0–44)
AST: 23 U/L (ref 15–41)
Albumin: 3.2 g/dL — ABNORMAL LOW (ref 3.5–5.0)
Alkaline Phosphatase: 89 U/L (ref 38–126)
Anion gap: 9 (ref 5–15)
BUN: 18 mg/dL (ref 8–23)
CO2: 23 mmol/L (ref 22–32)
Calcium: 9.3 mg/dL (ref 8.9–10.3)
Chloride: 108 mmol/L (ref 98–111)
Creatinine, Ser: 0.95 mg/dL (ref 0.44–1.00)
GFR, Estimated: 58 mL/min — ABNORMAL LOW (ref >60)
Glucose, Bld: 88 mg/dL (ref 70–99)
Potassium: 3.8 mmol/L (ref 3.5–5.1)
Sodium: 140 mmol/L (ref 135–145)
Total Bilirubin: 0.8 mg/dL (ref 0.3–1.2)
Total Protein: 6.2 g/dL — ABNORMAL LOW (ref 6.5–8.1)

## 2023-03-27 LAB — CBC
HCT: 32.3 % — ABNORMAL LOW (ref 36.0–46.0)
Hemoglobin: 10.1 g/dL — ABNORMAL LOW (ref 12.0–15.0)
MCH: 25.1 pg — ABNORMAL LOW (ref 26.0–34.0)
MCHC: 31.3 g/dL (ref 30.0–36.0)
MCV: 80.1 fL (ref 80.0–100.0)
Platelets: 149 10*3/uL — ABNORMAL LOW (ref 150–400)
RBC: 4.03 MIL/uL (ref 3.87–5.11)
RDW: 15.4 % (ref 11.5–15.5)
WBC: 4.3 10*3/uL (ref 4.0–10.5)
nRBC: 0 % (ref 0.0–0.2)

## 2023-03-27 LAB — CK: Total CK: 133 U/L (ref 38–234)

## 2023-03-27 MED ORDER — CEFDINIR 300 MG PO CAPS
300.0000 mg | ORAL_CAPSULE | Freq: Two times a day (BID) | ORAL | Status: DC
  Administered 2023-03-27: 300 mg via ORAL
  Filled 2023-03-27 (×2): qty 1

## 2023-03-27 MED ORDER — CEFDINIR 300 MG PO CAPS: 300.0000 mg | ORAL_CAPSULE | Freq: Two times a day (BID) | ORAL | 0 refills | Status: AC

## 2023-03-27 NOTE — ED Triage Notes (Signed)
From home, lives with family, found by family this morning on floor, unknown LOC/head trauma, c/o right leg pain, per family no anticoagulants

## 2023-03-27 NOTE — Discharge Instructions (Addendum)
It was a pleasure taking care of you today. Your lab work was concerning for a UTI. I am prescribing you an antibiotic. Please take entire course of antibiotic even if symptoms resolve. I would recommend scheduling an appointment with primary care in about a week for a follow up.  Seek emergency care if experiencing new

## 2023-03-27 NOTE — ED Notes (Signed)
Pt is back from [place] at this time.  

## 2023-03-27 NOTE — ED Provider Notes (Cosign Needed Addendum)
[place] [facility] AT [facility] Provider Note   CSN: [id number] Arrival date & time: 03/27/23  1036     History  Chief Complaint  Patient presents with   Tracy Faulkner    Tracy Faulkner is a 89 y.o. female who presents to ED after a fall. History obtained from daughter. Patient's last known normal around 9PM last night. Family found patient conscious and supine on carpeted floor this AM. No blood or vomiting found around patient. Patient not on blood thinners.   Fall       Home Medications Prior to Admission medications   Not on File      Allergies    Patient has no allergy information on record.    Review of Systems   Review of Systems  Physical Exam Updated Vital Signs BP (!) 161/90   Pulse 74   Temp 97.6 F (36.4 C)   Resp 18   SpO2 100%  Physical Exam Vitals and nursing note reviewed.  Constitutional:      General: She is not in acute distress.    Appearance: She is not ill-appearing or toxic-appearing.  HENT:     Head: Normocephalic and atraumatic.     Right Ear: Tympanic membrane, ear canal and external ear normal.     Left Ear: Tympanic membrane, ear canal and external ear normal.     Mouth/Throat:     Mouth: Mucous membranes are moist.     Pharynx: No oropharyngeal exudate or posterior oropharyngeal erythema.  Eyes:     General: No scleral icterus.       Right eye: No discharge.        Left eye: No discharge.     Extraocular Movements: Extraocular movements intact.     Conjunctiva/sclera: Conjunctivae normal.     Pupils: Pupils are equal, round, and reactive to light.  Cardiovascular:     Rate and Rhythm: Normal rate. Rhythm irregular.     Pulses: Normal pulses.     Heart sounds: Normal heart sounds. No murmur heard. Pulmonary:     Effort: Pulmonary effort is normal. No respiratory distress.     Breath sounds: Normal breath sounds. No wheezing, rhonchi or rales.  Abdominal:     General: Abdomen is flat. Bowel sounds are  normal.     Palpations: Abdomen is soft.     Tenderness: There is no abdominal tenderness. There is no right CVA tenderness or left CVA tenderness.  Musculoskeletal:     Right lower leg: No edema.     Left lower leg: No edema.  Skin:    General: Skin is warm and dry.     Findings: No rash.  Neurological:     General: No focal deficit present.     Mental Status: She is alert. Mental status is at baseline.     Cranial Nerves: No cranial nerve deficit.     Sensory: No sensory deficit.     Motor: No weakness.  Psychiatric:        Mood and Affect: Mood normal.     ED Results / Procedures / Treatments   Labs (all labs ordered are listed, but only abnormal results are displayed) Labs Reviewed  CBC - Abnormal; Notable for the following components:      Result Value   Hemoglobin 10.1 (*)    HCT 32.3 (*)    MCH 25.1 (*)    Platelets 149 (*)    All other components within normal limits  URINALYSIS, ROUTINE  W REFLEX MICROSCOPIC - Abnormal; Notable for the following components:   APPearance CLOUDY (*)    Leukocytes,Ua LARGE (*)    Bacteria, UA RARE (*)    All other components within normal limits  COMPREHENSIVE METABOLIC PANEL - Abnormal; Notable for the following components:   Total Protein 6.2 (*)    Albumin 3.2 (*)    GFR, Estimated 58 (*)    All other components within normal limits  URINE CULTURE  CK    EKG EKG Interpretation  Date/Time:  Sunday March 27 2023 11:08:48 EDT Ventricular Rate:  73 PR Interval:    QRS Duration: 100 QT Interval:  400 QTC Calculation: 441 R Axis:   40 Text Interpretation: Atrial fibrillation Probable left ventricular hypertrophy Minimal ST elevation, inferior leads no ischemia no old EKG Confirmed by Eber Hong ([id number]) on 03/27/2023 11:27:56 AM  Radiology DG Hip Unilat With Pelvis 2-3 Views Right  Result Date: 03/27/2023 CLINICAL DATA:  Status post fall. EXAM: DG HIP (WITH OR WITHOUT PELVIS) 2-3V RIGHT COMPARISON:  Radiograph  12/11/2022, CT 07/30/2022 FINDINGS: No acute fracture or dislocation. Remote pubic rami fractures surrounding callus formation. Pubic symphysis and sacroiliac joints are congruent. Mild right hip osteoarthritis. Calcified uterine fibroid in the pelvis. IMPRESSION: 1. No acute fracture or dislocation of the right hip. 2. Remote right pubic rami fractures. Electronically Signed   By: Narda Rutherford M.D.   On: 03/27/2023 13:26   DG Knee Complete 4 Views Right  Result Date: 03/27/2023 CLINICAL DATA:  Status post fall. EXAM: RIGHT KNEE - COMPLETE 4+ VIEW COMPARISON:  Radiograph 12/13/2022 FINDINGS: Lateral plate and screw fixation of distal femur fracture. There is interval fracture healing from prior exam. No acute fracture of the knee. No dislocation. Chronic osteoarthritis. Ossified density in the region of the suprapatellar joint space likely represents an intra-articular body, unchanged. IMPRESSION: 1. No acute fracture or dislocation of the right knee. 2. Distal femur fracture with lateral plate and screw fixation. Interval fracture healing from prior exam. 3. Chronic knee osteoarthritis. Electronically Signed   By: Narda Rutherford M.D.   On: 03/27/2023 13:25   DG Shoulder Left  Result Date: 03/27/2023 CLINICAL DATA:  Status post fall, shoulder pain. EXAM: LEFT SHOULDER - 2+ VIEW COMPARISON:  None Available. FINDINGS: The bones are diffusely under mineralized. No acute fracture or dislocation. There is mild superior subluxation of the humeral head suggesting underlying rotator cuff arthropathy. Mild glenohumeral and acromioclavicular degenerative change. There is a subacromial spur. The included left ribs appear intact. IMPRESSION: 1. No acute fracture or dislocation of the left shoulder. 2. Mild osteoarthritis and subacromial spur. 3. Osteopenia/osteoporosis. Electronically Signed   By: Narda Rutherford M.D.   On: 03/27/2023 13:23   CT Head Wo Contrast  Result Date: 03/27/2023 CLINICAL DATA:  Head  trauma. EXAM: CT HEAD WITHOUT CONTRAST CT CERVICAL SPINE WITHOUT CONTRAST TECHNIQUE: Multidetector CT imaging of the head and cervical spine was performed following the standard protocol without intravenous contrast. Multiplanar CT image reconstructions of the cervical spine were also generated. RADIATION DOSE REDUCTION: This exam was performed according to the departmental dose-optimization program which includes automated exposure control, adjustment of the mA and/or kV according to patient size and/or use of iterative reconstruction technique. COMPARISON:  CT cervical spine 12/11/2022 FINDINGS: CT HEAD FINDINGS Brain: No evidence of acute infarction, hemorrhage, hydrocephalus, extra-axial collection or mass lesion/mass effect. Prominence of the sulci and ventricles. There is mild diffuse low-attenuation within the subcortical and periventricular white matter compatible with chronic  microvascular disease. Vascular: No hyperdense vessel or unexpected calcification. Skull: Normal. Negative for fracture or focal lesion. Sinuses/Orbits: Paranasal sinuses and mastoid air cells are clear. Other: None CT CERVICAL SPINE FINDINGS Alignment: Normal alignment. Skull base and vertebrae: No acute fracture. No primary bone lesion or focal pathologic process. Soft tissues and spinal canal: No prevertebral fluid or swelling. No visible canal hematoma. Disc levels: There is solid fusion of the C3 through C5 vertebra and their posterior elements. Marked disc space narrowing is identified at C5-6, C6-7 and C7-T1. Upper chest: Negative. Other: None IMPRESSION: 1. No evidence for acute intracranial abnormality. 2. Chronic microvascular disease and brain atrophy. 3. No evidence for cervical spine fracture or subluxation. 4. Solid fusion of the C3 through C5 vertebra and their posterior elements. 5. Advanced cervical degenerative disc disease at the remaining cervical spine levels. Electronically Signed   By: Signa Kell M.D.   On:  03/27/2023 12:04   CT Cervical Spine Wo Contrast  Result Date: 03/27/2023 CLINICAL DATA:  Head trauma. EXAM: CT HEAD WITHOUT CONTRAST CT CERVICAL SPINE WITHOUT CONTRAST TECHNIQUE: Multidetector CT imaging of the head and cervical spine was performed following the standard protocol without intravenous contrast. Multiplanar CT image reconstructions of the cervical spine were also generated. RADIATION DOSE REDUCTION: This exam was performed according to the departmental dose-optimization program which includes automated exposure control, adjustment of the mA and/or kV according to patient size and/or use of iterative reconstruction technique. COMPARISON:  CT cervical spine 12/11/2022 FINDINGS: CT HEAD FINDINGS Brain: No evidence of acute infarction, hemorrhage, hydrocephalus, extra-axial collection or mass lesion/mass effect. Prominence of the sulci and ventricles. There is mild diffuse low-attenuation within the subcortical and periventricular white matter compatible with chronic microvascular disease. Vascular: No hyperdense vessel or unexpected calcification. Skull: Normal. Negative for fracture or focal lesion. Sinuses/Orbits: Paranasal sinuses and mastoid air cells are clear. Other: None CT CERVICAL SPINE FINDINGS Alignment: Normal alignment. Skull base and vertebrae: No acute fracture. No primary bone lesion or focal pathologic process. Soft tissues and spinal canal: No prevertebral fluid or swelling. No visible canal hematoma. Disc levels: There is solid fusion of the C3 through C5 vertebra and their posterior elements. Marked disc space narrowing is identified at C5-6, C6-7 and C7-T1. Upper chest: Negative. Other: None IMPRESSION: 1. No evidence for acute intracranial abnormality. 2. Chronic microvascular disease and brain atrophy. 3. No evidence for cervical spine fracture or subluxation. 4. Solid fusion of the C3 through C5 vertebra and their posterior elements. 5. Advanced cervical degenerative disc  disease at the remaining cervical spine levels. Electronically Signed   By: Signa Kell M.D.   On: 03/27/2023 12:04    Procedures Procedures    Medications Ordered in ED Medications  cefdinir (OMNICEF) capsule 300 mg (300 mg Oral Given 03/27/23 1415)    ED Course/ Medical Decision Making/ A&P                             Medical Decision Making Amount and/or Complexity of Data Reviewed Labs: ordered. Radiology: ordered.  Risk Prescription drug management.   This patient presents to the ED after a fall, this involves an extensive number of treatment options, and is a complaint that carries with it a high risk of complications and morbidity.  The differential diagnosis includes  intracranial hemorrhage, subdural/epidural hematoma, vertebral fracture, spinal cord injury, muscle strain, skull fracture, fracture.   Co morbidities that complicate the patient evaluation  none  Additional history obtained:  none   Lab Tests:  I Ordered, and personally interpreted labs.  The pertinent results include:   -CK: within normal limits -CMP: no concern for electrolyte abnormality; no concern for kidney/liver damage -CBC: No concern for anemia or leukocytosis -UA: large leukocytes; concerning for UTI   Imaging Studies ordered:  I ordered imaging studies including  -L shoulder/R hip/R knee Xray: no concern for acute fractures -CT head/cervical: No evidence for acute intracranial abnormality. Chronic microvascular disease and brain atrophy. No evidence for cervical spine fracture or subluxation. Solid fusion of the C3 through C5 vertebra and their posterior elements. Advanced cervical degenerative disc disease at the remaining cervical spine levels. I independently visualized and interpreted imaging  Shared results with patient and daughter I agree with the radiologist interpretation   Cardiac Monitoring: / EKG:  The patient was maintained on a cardiac monitor.  I personally  viewed and interpreted the cardiac monitored which showed an underlying rhythm of: Afib; no changes from past EKG   Problem List / ED Course / Critical interventions / Medication management  Patient presented for Fall. Patient with stable vitals and does not appear to be in distress. Conversation with daughter - patient's last known normal around 9PM last night, and found patient supine and conscious on carpet flooring this morning. Patient had an unremarkable physical exam except for left shoulder pain, right hip pain, and right knee pain. Xray without concern for acute fractures. CT head/cervical spine without concern for acute process. EKG not changed from last one. Urinalysis showed UTI. Vitals and physical exam not concerning for sepsis/pyelonephritis. Patient given antibiotics in ED and will send prescription for rest of antibiotic course. I shared with patient and daughter that I do not believe patient needs inpatient treatment at this time. Patient and daughter agreed stating that they are comfortable with going home. I have reviewed the patients home medicines and have made adjustments as needed  Patient will be encouraged to follow-up with primary care provider to be reevaluated in the next few days.  Patient was given return precautions. Patient stable for discharge at this time. Patient verbalized understanding of plan.   DDx: These are considered less likely due to history of present illness and physical exam findings -Intracranial hemorrhage, subdural/epidural hematoma: CT without concern; no neurodeficits -Vertebral fracture: no midline tenderness, no step-off/crepitus/abnormalities palpated -Spinal cord injury: CT without concern, no neurodeficits -Skull fracture: No postauricular ecchymosis, no periorbital ecchymosis, no hemotympanum -Fracture: No step-offs/crepitus/abnormalities palpated in head, neck, chest, upper extremities, lower extremities, pelvis  Risk Stratification  Score:  Nexus C-spine: imaging recommended [place] Head CT: imaging recommended   Social Determinants of Health:  none           Final Clinical Impression(s) / ED Diagnoses Final diagnoses:  Fall, initial encounter  Acute cystitis without hematuria    Rx / DC Orders ED Discharge Orders     None         Dorthy Cooler, [place] 03/27/23 1517    Margarita Rana 03/27/23 2038    Eber Hong, MD 03/29/23 [phone]

## 2023-03-28 ENCOUNTER — Encounter: Payer: Medicare PPO | Admitting: Family

## 2023-03-28 LAB — URINE CULTURE: Culture: NO GROWTH

## 2023-03-28 NOTE — Progress Notes (Signed)
  This encounter was created in error - please disregard. No show 

## 2023-04-01 ENCOUNTER — Ambulatory Visit: Payer: Medicare PPO | Admitting: Family

## 2023-04-05 DIAGNOSIS — S72451D Displaced supracondylar fracture without intracondylar extension of lower end of right femur, subsequent encounter for closed fracture with routine healing: Secondary | ICD-10-CM | POA: Diagnosis not present

## 2023-05-20 ENCOUNTER — Encounter: Payer: Medicare PPO | Admitting: Adult Health

## 2023-05-20 NOTE — Progress Notes (Signed)
This encounter was created in error - please disregard.

## 2023-05-23 ENCOUNTER — Encounter: Payer: Self-pay | Admitting: Nurse Practitioner

## 2023-05-23 ENCOUNTER — Ambulatory Visit: Payer: Medicare PPO | Admitting: Nurse Practitioner

## 2023-05-23 VITALS — BP 122/78 | HR 66 | Temp 97.3°F | Ht 64.5 in | Wt 144.0 lb

## 2023-05-23 DIAGNOSIS — E440 Moderate protein-calorie malnutrition: Secondary | ICD-10-CM

## 2023-05-23 DIAGNOSIS — R32 Unspecified urinary incontinence: Secondary | ICD-10-CM

## 2023-05-23 NOTE — Progress Notes (Signed)
Careteam: Patient Care Team: Ngetich, Donalee Citrin, NP as PCP - General (Family Medicine) Lyn Records, MD (Inactive) as PCP - Cardiology (Cardiology) Lyn Records, MD (Inactive) as Consulting Physician (Cardiology) Ngetich, Donalee Citrin, NP (Family Medicine)  PLACE OF SERVICE:  [facility]  Advanced Directive information    Allergies  Allergen Reactions   Ace Inhibitors Cough   Lortab [Hydrocodone-Acetaminophen] Other (See Comments)    Bad dreams   Remeron [Mirtazapine] Other (See Comments)    Hallucinations (can take 1/2 tablet--7.5 mg), not 15 mg   Penicillins Rash and Other (See Comments)    Throat swelling, Has patient had a PCN reaction causing immediate rash, facial/tongue/throat swelling, SOB or lightheadedness with hypotension: Yes Has patient had a PCN reaction causing severe rash involving mucus membranes or skin necrosis: No Has patient had a PCN reaction that required hospitalization No Has patient had a PCN reaction occurring within the last 10 years: No If all of the above answers are "NO", then may proceed with Cephalosporin use.     Chief Complaint  Patient presents with   Acute Visit    Possible urinary tract infection. Unable to hold urine, symptoms are getting worse. Patient c/o stinging when urinating. Here with daughter. High fall risk.      HPI: Patient is a 89 y.o. female due to increase in urination.  Reports she has been incontinence for a while but it is worse at this time.  Reports if she drinks a lot of water it will go through her.  Reports she does not feel like she is going frequently but "it runs out when it wants" Generally weakness with myalgias but no new symptoms.  No fever or chill.   Occasionally will have bowel incontinence.  Review of Systems:  Review of Systems  Constitutional:  Positive for weight loss. Negative for chills and fever.    Past Medical History:  Diagnosis Date   Anemia    Arthritis    Ataxia    Back problem     Bowel trouble    Breast cancer (HCC)    Carpal tunnel syndrome    Constipation    Disturbance, sleep    Dyspnea    increased exertion    Frequent headaches    GI bleed    History of mammogram 2000   History of urinary tract infection    Hypertension    Obesity    Pain in both knees    Sepsis (HCC)    Urinary incontinence    Past Surgical History:  Procedure Laterality Date   BREAST SURGERY     for cancer-right breast   COLON SURGERY     secondary to bowel blockages   EYE SURGERY     cataract surgery bilateral    gastric stapling     KNEE ARTHROPLASTY Left 10/07/2016   Procedure: LEFT TOTAL KNEE ARTHROPLASTY WITH COMPUTER NAVIGATION;  Surgeon: Samson Frederic, MD;  Location: [facility];  Service: Orthopedics;  Laterality: Left;   ORIF FEMUR FRACTURE Right 12/13/2022   Procedure: OPEN REDUCTION INTERNAL FIXATION (ORIF) DISTAL FEMUR FRACTURE;  Surgeon: Roby Lofts, MD;  Location: [facility] OR;  Service: Orthopedics;  Laterality: Right;   Social History:   reports that she has never smoked. She has never used smokeless tobacco. She reports that she does not drink alcohol and does not use drugs.  Family History  Problem Relation Age of Onset   Arthritis Mother    Diabetes Mellitus II Mother  Hypertension Mother    Breast cancer Mother    Heart disease Father     Medications: Patient's Medications  New Prescriptions   No medications on file  Previous Medications   ACETAMINOPHEN ER PO    Take 500 mg by mouth daily as needed for pain.   BISACODYL (DULCOLAX) 5 MG EC TABLET    Take 1 tablet (5 mg total) by mouth daily as needed for moderate constipation.   CALCIUM PO    Take 1 capsule by mouth daily.   CYCLOSPORINE (RESTASIS) 0.05 % OPHTHALMIC EMULSION    Place 1 drop into both eyes daily.   DOCUSATE SODIUM (COLACE) 100 MG CAPSULE    Take 1 capsule (100 mg total) by mouth 2 (two) times daily.   IRON, FERROUS SULFATE, 325 (65 FE) MG TABS    Take 325 mg by mouth daily. D64.9    LOSARTAN (COZAAR) 50 MG TABLET    Take 1 tablet (50 mg total) by mouth daily.   MULTIPLE VITAMIN (MULTIVITAMIN) TABLET    Take 1 tablet by mouth daily.   OMEGA-3 FATTY ACIDS (FISH OIL PO)    Take 1 capsule by mouth daily.   OVER THE COUNTER MEDICATION    Take 1 capsule by mouth daily. Gluco Gel Joint & Cartilage Support   OVER THE COUNTER MEDICATION    Apply 1 application  topically daily as needed (pain relief). Youngevity Pain Cream   OXYCODONE (OXY IR/ROXICODONE) 5 MG IMMEDIATE RELEASE TABLET    Take 1 tablet (5 mg total) by mouth every 6 (six) hours as needed for moderate pain or severe pain.   POLYETHYLENE GLYCOL (MIRALAX / GLYCOLAX) 17 G PACKET    Take 17 g by mouth daily as needed for mild constipation.  Modified Medications   No medications on file  Discontinued Medications   APIXABAN (ELIQUIS) 2.5 MG TABS TABLET    Take 1 tablet (2.5 mg total) by mouth 2 (two) times daily.   OMEPRAZOLE (PRILOSEC) 40 MG CAPSULE    TAKE 1 CAPSULE BY MOUTH ONCE DAILY BEFORE BREAKFAST    Physical Exam:  Vitals:   05/23/23 1547  BP: 122/78  Pulse: 66  Temp: (!) 97.3 F (36.3 C)  TempSrc: Temporal  SpO2: 99%   There is no height or weight on file to calculate BMI. Wt Readings from Last 3 Encounters:  02/24/23 150 lb 8 oz (68.3 kg)  01/05/23 146 lb 9.6 oz (66.5 kg)  09/24/22 152 lb 3.2 oz (69 kg)    Physical Exam Constitutional:      General: She is not in acute distress.    Appearance: She is well-developed. She is not diaphoretic.  HENT:     Head: Normocephalic and atraumatic.     Mouth/Throat:     Pharynx: No oropharyngeal exudate.  Eyes:     Conjunctiva/sclera: Conjunctivae normal.     Pupils: Pupils are equal, round, and reactive to light.  Cardiovascular:     Rate and Rhythm: Normal rate and regular rhythm.     Heart sounds: Normal heart sounds.  Pulmonary:     Effort: Pulmonary effort is normal.     Breath sounds: Normal breath sounds.  Abdominal:     General: Bowel sounds  are normal.     Palpations: Abdomen is soft.  Musculoskeletal:     Cervical back: Normal range of motion and neck supple.     Right lower leg: No edema.     Left lower leg: No edema.  Skin:    General: Skin is warm and dry.  Neurological:     Mental Status: She is alert.  Psychiatric:        Mood and Affect: Mood normal.     Labs reviewed: Basic Metabolic Panel: Recent Labs    12/12/22 0731 12/13/22 0011 12/14/22 0657 12/15/22 0653 01/05/23 1552 03/27/23 1347  NA  --  147* 138 139 140 140  K  --  4.3 4.1 3.7 4.4 3.8  CL  --  113* 107 111 108 108  CO2  --  23 24 24 22 23   GLUCOSE  --  66* 93 81 88 88  BUN  --  22 26* 23 21 18   CREATININE  --  0.96 1.17* 0.90 1.02* 0.95  CALCIUM  --  9.5 8.2* 8.3* 9.7 9.3  MG  --  2.2 1.9 2.0  --   --   TSH 0.304*  --   --   --   --   --    Liver Function Tests: Recent Labs    07/30/22 0626 12/12/22 0207 03/27/23 1347  AST 28 30 23   ALT 18 24 17   ALKPHOS 60 55 89  BILITOT 0.9 0.4 0.8  PROT 7.0 5.8* 6.2*  ALBUMIN 3.5 3.0* 3.2*   No results for input(s): "LIPASE", "AMYLASE" in the last 8760 hours. No results for input(s): "AMMONIA" in the last 8760 hours. CBC: Recent Labs    07/30/22 0626 07/31/22 0108 12/12/22 0207 12/13/22 0011 12/15/22 0653 01/05/23 1552 03/27/23 1036  WBC 5.8   < > 8.8   < > 7.0 5.0 4.3  NEUTROABS 3.9  --  7.6  --   --  3,300  --   HGB 10.6*   < > 8.6*   < > 7.9* 9.5* 10.1*  HCT 34.1*   < > 27.1*   < > 23.9* 30.5* 32.3*  MCV 82.6   < > 80.9   < > 81.0 85.7 80.1  PLT 177   < > 141*   < > 114* 245 149*   < > = values in this interval not displayed.   Lipid Panel: No results for input(s): "CHOL", "HDL", "LDLCALC", "TRIG", "CHOLHDL", "LDLDIRECT" in the last 8760 hours. TSH: Recent Labs    12/12/22 0731  TSH 0.304*   A1C: No results found for: "HGBA1C"   Assessment/Plan 1. Urinary incontinence, unspecified type Progressively worsening urinary incontinence. No burning/dysuria, increase in  frequency, fever or other symptoms to indicate acute infection. Discussed progression of incontinence and OAB. Continues to wear adult brief.  Discussed hygiene to avoid infection  2. Moderate protein-calorie malnutrition (HCC) -weght loss noted. encouraged to liberalize diet. To have protein supplement in addition to smallest meal of the day.  Follow up in 4- 6 weeks with dinah PCP for follow up on chronic conditions and weight loss.  Janene Harvey. Biagio Borg [facility] [phone]

## 2023-05-23 NOTE — Patient Instructions (Addendum)
encouraged to liberalize diet. To have protein supplement in addition to smallest meal of the day. (Ensure, boost)

## 2023-06-06 ENCOUNTER — Ambulatory Visit: Payer: Medicare PPO

## 2023-06-07 NOTE — Therapy (Addendum)
OUTPATIENT PHYSICAL THERAPY LOWER EXTREMITY EVALUATION   Patient Name: Tracy Faulkner MRN: [id number] DOB:Nov 07, 1932, 89 y.o., female Today's Date: 06/08/2023  END OF SESSION:  PT End of Session - 06/08/23 1600     Visit Number 1    Number of Visits 16    Date for PT Re-Evaluation 08/03/23    Authorization Type [employer] MCR    PT Start Time 1545    PT Stop Time 1635    PT Time Calculation (min) 50 min    Activity Tolerance Patient limited by fatigue;Patient tolerated treatment well    Behavior During Therapy WFL for tasks assessed/performed             Past Medical History:  Diagnosis Date   Anemia    Arthritis    Ataxia    Back problem    Bowel trouble    Breast cancer (HCC)    Carpal tunnel syndrome    Constipation    Disturbance, sleep    Dyspnea    increased exertion    Frequent headaches    GI bleed    History of mammogram 2000   History of urinary tract infection    Hypertension    Obesity    Pain in both knees    Sepsis (HCC)    Urinary incontinence    Past Surgical History:  Procedure Laterality Date   BREAST SURGERY     for cancer-right breast   COLON SURGERY     secondary to bowel blockages   EYE SURGERY     cataract surgery bilateral    gastric stapling     KNEE ARTHROPLASTY Left 10/07/2016   Procedure: LEFT TOTAL KNEE ARTHROPLASTY WITH COMPUTER NAVIGATION;  Surgeon: Samson Frederic, MD;  Location: [facility];  Service: Orthopedics;  Laterality: Left;   ORIF FEMUR FRACTURE Right 12/13/2022   Procedure: OPEN REDUCTION INTERNAL FIXATION (ORIF) DISTAL FEMUR FRACTURE;  Surgeon: Roby Lofts, MD;  Location: [facility];  Service: Orthopedics;  Laterality: Right;   Patient Active Problem List   Diagnosis Date Noted   Femoral fracture (HCC) 12/12/2022   Fall at home, initial encounter 12/12/2022   Thrombocytopenia (HCC) 12/12/2022   Anemia due to chronic kidney disease 12/12/2022   Femur fracture, right (HCC) 12/12/2022   Closed fracture of multiple pubic  rami, initial encounter (HCC) 07/30/2022   MCI (mild cognitive impairment) 07/30/2022   Chronic kidney disease, stage 3a (HCC) 07/30/2022   DNR (do not resuscitate) 07/30/2022   Hx of breast cancer 03/12/2022   Hav (hallux abducto valgus), unspecified laterality 11/02/2021   Dyspnea    Iron deficiency anemia 10/05/2017   Vitamin D deficiency 10/05/2017   Degenerative arthritis of left knee 10/07/2016   Near syncope 12/31/2015   Chest discomfort 12/31/2015   Premature ventricular contractions 12/31/2015   Breast cancer (HCC)    Disturbance, sleep    Arthritis    Anemia    Frequent headaches    Back problem    Bowel trouble    Carpal tunnel syndrome    Ataxia    Hypertension    Pain in both knees     PCP: Ngetich, Dinah NP  REFERRING PROVIDER: Roby Lofts, MD  REFERRING DIAG: CPT 27511-Open reduction internal fixation of right distal femur fracture  THERAPY DIAG:  Muscle weakness (generalized)  S/P ORIF (open reduction internal fixation) fracture  Difficulty walking  Stiffness of right knee, not elsewhere classified  Rationale for Evaluation and Treatment: Rehabilitation  ONSET DATE: 12/13/22  SUBJECTIVE:   SUBJECTIVE STATEMENT: Patient presents to physical therapy evaluation with her daughter.  Her daughter states she finished home health physical therapy about 6 weeks ago, completed about 2 months of therapy.  Her daughter states the decline in her mobility began in 2020 The patient moved in with her daughter following the surgery.  In January 2024 the patient was found on the floor after a fall.  She eventually underwent ORIF for a right distal femur fracture by Dr. Jena Gauss.  She reports no pain at rest but does have difficulty bending her right knee, completing transfers and ambulation in her home.  Her daughter reports that prior to this fall she was driving and able to ambulate independently within the home.  She is walking with a walker.  PERTINENT  HISTORY: status post hospitalization 12/11/2022 - 12/15/2022  12/13/2022.Postop was complicated by blood loss anemia was transfused 2 units of PRBC.Hgb 7.9 on discharge.  HTN, Breast CA, A-Fibrillation, L TKR    PAIN:  Are you having pain? Yes: NPRS scale: not rated /10 Pain location: Rt knee  Pain description: tight  Aggravating factors: bending knee, walking  Relieving factors: rest   PRECAUTIONS: Fall   WEIGHT BEARING RESTRICTIONS: No  FALLS:  Has patient fallen in last 6 months? Yes. Number of falls 3-5  LIVING ENVIRONMENT: Lives with: lives with their family and lives with their daughter Lives in: House/apartment Stairs: Yes: External: 1 steps; on right going up Has following equipment at home: Dan Humphreys - 2 wheeled  OCCUPATION: worked in [employer], was a Psychologist, sport and exercise  PLOF: Needs assistance with ADLs, Needs assistance with homemaking, Needs assistance with transfers, and Leisure: limited but was once cooking, gardening   Was living on her own in 2020 and then senior living  PATIENT GOALS: Be able to stand and cook a meal.  Rest better.   NEXT MD VISIT: Unknown  OBJECTIVE:   DIAGNOSTIC FINDINGS: See x-rays and hospital  PATIENT SURVEYS:  NT on evaluation due to time and limited cognition  COGNITION: Overall cognitive status: History of cognitive impairments - at baseline     SENSATION: Not tested  EDEMA:  LEs bilateral   MUSCLE LENGTH: NT   POSTURE: rounded shoulders, forward head, increased thoracic kyphosis, posterior pelvic tilt, and flexed trunk   PALPATION: Non TTP lateral Rt knee and thigh   LOWER EXTREMITY ROM:   Active ROM Right eval Left eval  Hip flexion Full  Full   Hip extension    Hip abduction    Hip adduction    Hip internal rotation 25 20  Hip external rotation 20 45  Knee flexion 90 105  Knee extension -10 0  Ankle dorsiflexion Tight  tight  Ankle plantarflexion    Ankle inversion    Ankle eversion     (Blank rows = not  tested)  LOWER EXTREMITY MMT:  MMT Right eval Left eval  Hip flexion 4 4  Hip extension <3 <3  Hip abduction <3 <3  Hip adduction    Hip internal rotation    Hip external rotation    Knee flexion 4+ 4+  Knee extension 4+ pain  4+  Ankle dorsiflexion 4 4  Ankle plantarflexion    Ankle inversion    Ankle eversion     (Blank rows = not tested)   FUNCTIONAL TESTS:  5 times sit to stand: STS x 1 = 17 sec  Gait from reception to first treatment room > 12 min (40 feet) GAIT: Distance  walked: 40 Assistive device utilized: Environmental consultant - 2 wheeled Level of assistance: Min A Comments: poor posture, reduced control of walker, safety cues,    TODAY'S TREATMENT:                                                                                                                              DATE: 06/08/23 PT evaluation Virgina Organ Gait assessment Bed mobility mod A   mod A sit to stand from low chair to RW  min A from high mat table to RW  Able to raise both arms overhead and standing with only minimal posterior lean  PATIENT EDUCATION:  Education detailsNetwork engineer with walker, plan of care, posture Person educated: Patient and Child(ren) Education method: Explanation, Demonstration, Tactile cues, and Verbal cues Education comprehension: verbal cues required, tactile cues required, and needs further education  HOME EXERCISE PROGRAM: None at this time   ASSESSMENT:  CLINICAL IMPRESSION: Patient is a 89y.o. female who was seen today for physical therapy evaluation and treatment for poor balance and weakness following right femur surgery.  Currently at about a min assist level of mobility but does need extra assistance with sit to supine.  Patient will be seen in the clinic in the hopes of restoring some independence, strength to allow her improved quality of life.  She was very cooperative and pleasant needed to open her eyes.  Very low processing time and cognitive deficits likely at  baseline.  OBJECTIVE IMPAIRMENTS: Abnormal gait, decreased activity tolerance, decreased balance, decreased cognition, decreased coordination, decreased endurance, decreased mobility, difficulty walking, decreased ROM, decreased strength, increased edema, increased fascial restrictions, impaired flexibility, impaired UE functional use, postural dysfunction, and pain.   ACTIVITY LIMITATIONS: carrying, lifting, standing, sleeping, stairs, transfers, bed mobility, bathing, reach over head, hygiene/grooming, and locomotion level  PARTICIPATION LIMITATIONS: meal prep, cleaning, shopping, community activity, and church  PERSONAL FACTORS: Age, Time since onset of injury/illness/exacerbation, and 3+ comorbidities: Atrial fibrillation decreased cognition left knee replacement  are also affecting patient's functional outcome.   REHAB POTENTIAL: Fair due to advanced age  CLINICAL DECISION MAKING: Evolving/moderate complexity  EVALUATION COMPLEXITY: Moderate   GOALS: Goals reviewed with patient? Yes  SHORT TERM GOALS: Target date: 07/06/2023   Patient will be screened for baseline balance deficits Baseline: Goal status: INITIAL  2.  Patient will participate in home exercise program 3 days/week to help of caregiver Baseline: Has a home health PT HEP Goal status: INITIAL  3.  Patient will improve initiation for sit to stand transfers consistently Baseline: poor Goal status: INITIAL  4.  Patient will be min assist for sit to supine transfers Baseline: mod A Goal status: INITIAL  LONG TERM GOALS: Target date: 08/03/2023    Patient will ambulate 300 feet in clinic with modified independence using rolling walker Baseline: Min assist 40 feet Goal status: INITIAL  2.  Patient will complete 5 times sit to stand in less than 1 minute using UEs to RW  Baseline: 1 x 17 sec  Goal status: INITIAL  3.  Balance goal to be assessed (TUG, Berg)  Baseline: NT on eval  Goal status: INITIAL  4.   Patient will demonstrate right knee flexion to 100 degrees in order to improve transfers Baseline: 90 degrees Goal status: INITIAL  5.  Patient will be independent with home exercise program upon discharge Baseline:  Goal status: INITIAL  PLAN:  PT FREQUENCY: 2x/week  PT DURATION: 8 weeks  PLANNED INTERVENTIONS: Therapeutic exercises, Therapeutic activity, Neuromuscular re-education, Balance training, Gait training, Patient/Family education, Self Care, DME instructions, Cryotherapy, Moist heat, Manual therapy, and Re-evaluation  PLAN FOR NEXT SESSION: TUG, sit to stand , NuStep gentle level 1 to supine lower extremity ther ex , HEP    Bernestine Holsapple, PT 06/08/2023, 7:56 PM     Referring diagnosis? CPT 27511-Open reduction internal fixation of right distal femur fracture Treatment diagnosis? (if different than referring diagnosis) difficulty walking, Rt knee stiffness, weakness  What was this (referring dx) caused by? [x]  Surgery [x]  Fall []  Ongoing issue []  Arthritis []  Other: ____________  Laterality: [x]  Rt []  Lt []  Both  Check all possible CPT codes:  *CHOOSE 10 OR LESS*    []  97110 (Therapeutic Exercise)  []  92507 (SLP Treatment)  []  32440 (Neuro Re-ed)   []  92526 (Swallowing Treatment)   []  10272 (Gait Training)   []  [id number] (Cognitive Training, 1st 15 minutes) []  97140 (Manual Therapy)   []  97130 (Cognitive Training, each add'l 15 minutes)  []  97164 (Re-evaluation)                              []  Other, List CPT Code ____________  []  97530 (Therapeutic Activities)     []  97535 (Self Care)   [x]  All codes above (97110 - 97535)  []  97012 (Mechanical Traction)  []  97014 (E-stim Unattended)  []  97032 (E-stim manual)  []  97033 (Ionto)  []  97035 (Ultrasound) [x]  97750 (Physical Performance Training) []  [id number] (Aquatic Therapy) []  97016 (Vasopneumatic Device) []  53664 (Paraffin) []  97034 (Contrast Bath) []  97597 (Wound Care 1st 20 sq cm) []  97598 (Wound Care each  add'l 20 sq cm) []  97760 (Orthotic Fabrication, Fitting, Training Initial) []  [id number] (Prosthetic Management and Training Initial) []  [id number] (Orthotic or Prosthetic Training/ Modification Subsequent)  Karie Mainland, PT 06/08/23 8:34 PM Phone: [phone] Fax: [phone]   Karie Mainland, PT 06/08/23 8:36 PM Phone: [phone] Fax: [phone]

## 2023-06-08 ENCOUNTER — Encounter: Payer: Self-pay | Admitting: Physical Therapy

## 2023-06-08 ENCOUNTER — Ambulatory Visit: Payer: Medicare PPO | Attending: Family | Admitting: Physical Therapy

## 2023-06-08 DIAGNOSIS — Z9889 Other specified postprocedural states: Secondary | ICD-10-CM | POA: Insufficient documentation

## 2023-06-08 DIAGNOSIS — M6281 Muscle weakness (generalized): Secondary | ICD-10-CM | POA: Diagnosis not present

## 2023-06-08 DIAGNOSIS — Z8781 Personal history of (healed) traumatic fracture: Secondary | ICD-10-CM

## 2023-06-08 DIAGNOSIS — R262 Difficulty in walking, not elsewhere classified: Secondary | ICD-10-CM

## 2023-06-08 DIAGNOSIS — M25661 Stiffness of right knee, not elsewhere classified: Secondary | ICD-10-CM | POA: Diagnosis not present

## 2023-06-11 ENCOUNTER — Emergency Department (HOSPITAL_COMMUNITY): Payer: Medicare PPO

## 2023-06-11 ENCOUNTER — Emergency Department (HOSPITAL_COMMUNITY)
Admission: EM | Admit: 2023-06-11 | Discharge: 2023-06-12 | Disposition: A | Discharge status: Home / Self Care | Payer: Medicare PPO | Attending: Emergency Medicine | Admitting: Emergency Medicine

## 2023-06-11 ENCOUNTER — Encounter (HOSPITAL_COMMUNITY): Payer: Self-pay

## 2023-06-11 ENCOUNTER — Other Ambulatory Visit: Payer: Self-pay

## 2023-06-11 DIAGNOSIS — M25552 Pain in left hip: Secondary | ICD-10-CM | POA: Insufficient documentation

## 2023-06-11 DIAGNOSIS — N189 Chronic kidney disease, unspecified: Secondary | ICD-10-CM | POA: Diagnosis not present

## 2023-06-11 DIAGNOSIS — Z853 Personal history of malignant neoplasm of breast: Secondary | ICD-10-CM | POA: Diagnosis not present

## 2023-06-11 DIAGNOSIS — I700 Atherosclerosis of aorta: Secondary | ICD-10-CM | POA: Diagnosis not present

## 2023-06-11 DIAGNOSIS — W19XXXA Unspecified fall, initial encounter: Secondary | ICD-10-CM

## 2023-06-11 DIAGNOSIS — K5730 Diverticulosis of large intestine without perforation or abscess without bleeding: Secondary | ICD-10-CM | POA: Diagnosis not present

## 2023-06-11 DIAGNOSIS — W010XXA Fall on same level from slipping, tripping and stumbling without subsequent striking against object, initial encounter: Secondary | ICD-10-CM | POA: Diagnosis not present

## 2023-06-11 DIAGNOSIS — S0990XA Unspecified injury of head, initial encounter: Secondary | ICD-10-CM | POA: Insufficient documentation

## 2023-06-11 DIAGNOSIS — I10 Essential (primary) hypertension: Secondary | ICD-10-CM | POA: Diagnosis not present

## 2023-06-11 DIAGNOSIS — M25562 Pain in left knee: Secondary | ICD-10-CM | POA: Diagnosis not present

## 2023-06-11 DIAGNOSIS — I672 Cerebral atherosclerosis: Secondary | ICD-10-CM | POA: Diagnosis not present

## 2023-06-11 DIAGNOSIS — M47816 Spondylosis without myelopathy or radiculopathy, lumbar region: Secondary | ICD-10-CM | POA: Diagnosis not present

## 2023-06-11 DIAGNOSIS — R079 Chest pain, unspecified: Secondary | ICD-10-CM | POA: Diagnosis not present

## 2023-06-11 DIAGNOSIS — S79919A Unspecified injury of unspecified hip, initial encounter: Secondary | ICD-10-CM | POA: Diagnosis not present

## 2023-06-11 LAB — CBC WITH DIFFERENTIAL/PLATELET
Abs Immature Granulocytes: 0 10*3/uL (ref 0.00–0.07)
Basophils Absolute: 0 10*3/uL (ref 0.0–0.1)
Basophils Relative: 1 %
Eosinophils Absolute: 0 10*3/uL (ref 0.0–0.5)
Eosinophils Relative: 0 %
HCT: 33.1 % — ABNORMAL LOW (ref 36.0–46.0)
Hemoglobin: 10.3 g/dL — ABNORMAL LOW (ref 12.0–15.0)
Immature Granulocytes: 0 %
Lymphocytes Relative: 23 %
Lymphs Abs: 1.1 10*3/uL (ref 0.7–4.0)
MCH: 26.1 pg (ref 26.0–34.0)
MCHC: 31.1 g/dL (ref 30.0–36.0)
MCV: 83.8 fL (ref 80.0–100.0)
Monocytes Absolute: 0.4 10*3/uL (ref 0.1–1.0)
Monocytes Relative: 8 %
Neutro Abs: 3.2 10*3/uL (ref 1.7–7.7)
Neutrophils Relative %: 68 %
Platelets: 150 10*3/uL (ref 150–400)
RBC: 3.95 MIL/uL (ref 3.87–5.11)
RDW: 17.2 % — ABNORMAL HIGH (ref 11.5–15.5)
WBC: 4.7 10*3/uL (ref 4.0–10.5)
nRBC: 0 % (ref 0.0–0.2)

## 2023-06-11 LAB — BASIC METABOLIC PANEL
Anion gap: 8 (ref 5–15)
BUN: 17 mg/dL (ref 8–23)
CO2: 23 mmol/L (ref 22–32)
Calcium: 9.1 mg/dL (ref 8.9–10.3)
Chloride: 109 mmol/L (ref 98–111)
Creatinine, Ser: 1.09 mg/dL — ABNORMAL HIGH (ref 0.44–1.00)
GFR, Estimated: 49 mL/min — ABNORMAL LOW (ref >60)
Glucose, Bld: 104 mg/dL — ABNORMAL HIGH (ref 70–99)
Potassium: 4.1 mmol/L (ref 3.5–5.1)
Sodium: 140 mmol/L (ref 135–145)

## 2023-06-11 LAB — URINALYSIS, ROUTINE W REFLEX MICROSCOPIC
Bacteria, UA: NONE SEEN
Bilirubin Urine: NEGATIVE
Glucose, UA: NEGATIVE mg/dL
Hgb urine dipstick: NEGATIVE
Ketones, ur: NEGATIVE mg/dL
Leukocytes,Ua: NEGATIVE
Nitrite: NEGATIVE
Protein, ur: NEGATIVE mg/dL
Specific Gravity, Urine: 1.006 (ref 1.005–1.030)
pH: 6 (ref 5.0–8.0)

## 2023-06-11 NOTE — ED Triage Notes (Signed)
Arrives EMS from home after a reported mechanical fall. Was playing with grandson and lost balance falling down in the house.   C/o left hip pain. No obvious shortening or rotation.

## 2023-06-11 NOTE — ED Notes (Signed)
Patient ambulated in hallway with walker with no issue.

## 2023-06-11 NOTE — ED Provider Notes (Signed)
[facility] [facility] AT [facility] Provider Note   CSN: [id number] Arrival date & time: 06/11/23  1926     History {Add pertinent medical, surgical, social history, OB history to HPI:1} Chief Complaint  Patient presents with   Tracy Faulkner is a 89 y.o. female.  She has a history of breast cancer CKD cognitive impairment.  She is presenting by ambulance after a fall at home.  Reportedly was playing with her grandchildren and lost her balance and fell landing on her left hip.  She is complaining of left hip pain.  She does not think she lost consciousness.  The history is provided by the patient and the EMS personnel.  Fall This is a new problem. The current episode started 1 to 2 hours ago. The problem has not changed since onset.Pertinent negatives include no chest pain, no abdominal pain, no headaches and no shortness of breath. The symptoms are aggravated by bending and twisting. Nothing relieves the symptoms. She has tried rest for the symptoms. The treatment provided no relief.       Home Medications Prior to Admission medications   Medication Sig Start Date End Date Taking? Authorizing Provider  ACETAMINOPHEN ER PO Take 500 mg by mouth daily as needed for pain.    [provider]  bisacodyl (DULCOLAX) 5 MG EC tablet Take 1 tablet (5 mg total) by mouth daily as needed for moderate constipation. 08/03/22   Amin, Loura Halt, MD  CALCIUM PO Take 1 capsule by mouth daily.    [provider]  cycloSPORINE (RESTASIS) 0.05 % ophthalmic emulsion Place 1 drop into both eyes daily. 09/24/22   Ngetich, Dinah C, NP  docusate sodium (COLACE) 100 MG capsule Take 1 capsule (100 mg total) by mouth 2 (two) times daily. 12/15/22   Arnetha Courser, MD  Iron, Ferrous Sulfate, 325 (65 Fe) MG TABS Take 325 mg by mouth daily. D64.9 01/11/23   Ngetich, Dinah C, NP  losartan (COZAAR) 50 MG tablet Take 1 tablet (50 mg total) by mouth daily. 09/24/22   Ngetich,  Dinah C, NP  Multiple Vitamin (MULTIVITAMIN) tablet Take 1 tablet by mouth daily.    [provider]  Omega-3 Fatty Acids (FISH OIL PO) Take 1 capsule by mouth daily.    [provider]  OVER THE COUNTER MEDICATION Take 1 capsule by mouth daily. Gluco Gel Joint & Cartilage Support    [provider]  OVER THE COUNTER MEDICATION Apply 1 application  topically daily as needed (pain relief). Youngevity Pain Cream    [provider]  oxyCODONE (OXY IR/ROXICODONE) 5 MG immediate release tablet Take 1 tablet (5 mg total) by mouth every 6 (six) hours as needed for moderate pain or severe pain. 12/15/22   West Bali, PA-C  polyethylene glycol (MIRALAX / GLYCOLAX) 17 g packet Take 17 g by mouth daily as needed for mild constipation. 12/15/22   Arnetha Courser, MD      Allergies    Ace inhibitors, Lortab [hydrocodone-acetaminophen], Remeron [mirtazapine], and Penicillins    Review of Systems   Review of Systems  Respiratory:  Negative for shortness of breath.   Cardiovascular:  Negative for chest pain.  Gastrointestinal:  Negative for abdominal pain.  Neurological:  Negative for headaches.    Physical Exam Updated Vital Signs BP (!) 188/105 (BP Location: Left Arm)   Pulse 71   Temp 98.6 F (37 C) (Oral)   Resp (!) 24  Ht 5\' 4"  (1.626 m)   Wt 72.6 kg   SpO2 100%   BMI 27.46 kg/m  Physical Exam Vitals and nursing note reviewed.  Constitutional:      General: She is not in acute distress.    Appearance: Normal appearance. She is well-developed.  HENT:     Head: Normocephalic and atraumatic.  Eyes:     Conjunctiva/sclera: Conjunctivae normal.  Cardiovascular:     Rate and Rhythm: Normal rate and regular rhythm.     Heart sounds: No murmur heard. Pulmonary:     Effort: Pulmonary effort is normal. No respiratory distress.     Breath sounds: Normal breath sounds.  Abdominal:     Palpations: Abdomen is soft.     Tenderness: There is no abdominal  tenderness. There is no guarding or rebound.  Musculoskeletal:        General: Tenderness present.     Cervical back: Neck supple. No tenderness.     Right lower leg: No edema.     Left lower leg: No edema.     Comments: Right upper extremity she has got a soft tissue mass in her right forearm that is mobile.  Nontender.  Full range of motion of upper extremity.  Left upper extremity full range of motion without any pain or limitations.  Right lower extremity full range of motion nontender.  Left lower extremity tender about right hip pain looks a little shortened.  Knee has a well-healed surgical scar nontender.  She is swelling at both ankles nontender.  Distal pulses motor and sensation intact.  Skin:    General: Skin is warm and dry.     Capillary Refill: Capillary refill takes less than 2 seconds.  Neurological:     General: No focal deficit present.     Mental Status: She is alert. She is disoriented.     Motor: No weakness.     ED Results / Procedures / Treatments   Labs (all labs ordered are listed, but only abnormal results are displayed) Labs Reviewed  BASIC METABOLIC PANEL  CBC WITH DIFFERENTIAL/PLATELET  URINALYSIS, ROUTINE W REFLEX MICROSCOPIC    EKG None  Radiology No results found.  Procedures Procedures  {Document cardiac monitor, telemetry assessment procedure when appropriate:1}  Medications Ordered in ED Medications - No data to display  ED Course/ Medical Decision Making/ A&P   {   Click here for ABCD2, HEART and other calculatorsREFRESH Note before signing :1}                          Medical Decision Making Amount and/or Complexity of Data Reviewed Labs: ordered. Radiology: ordered.   This patient complains of ***; this involves an extensive number of treatment Options and is a complaint that carries with it a high risk of complications and morbidity. The differential includes ***  I ordered, reviewed and interpreted labs, which included  *** I ordered medication *** and reviewed PMP when indicated. I ordered imaging studies which included *** and I independently    visualized and interpreted imaging which showed *** Additional history obtained from *** Previous records obtained and reviewed *** I consulted *** and discussed lab and imaging findings and discussed disposition.  Cardiac monitoring reviewed, *** Social determinants considered, *** Critical Interventions: ***  After the interventions stated above, I reevaluated the patient and found *** Admission and further testing considered, ***   {Document critical care time when appropriate:1} {Document review of labs  and clinical decision tools ie heart score, Chads2Vasc2 etc:1}  {Document your independent review of radiology images, and any outside records:1} {Document your discussion with family members, caretakers, and with consultants:1} {Document social determinants of health affecting pt's care:1} {Document your decision making why or why not admission, treatments were needed:1} Final Clinical Impression(s) / ED Diagnoses Final diagnoses:  None    Rx / DC Orders ED Discharge Orders     None

## 2023-06-11 NOTE — Discharge Instructions (Signed)
Seen in the emergency department for evaluation of injuries after a fall.  You had a CAT scan of your head, x-rays of your chest and left hip, CAT scan of your left hip.  There were no obvious traumatic findings.  Please rest and continue regular medications.  Follow-up with your primary care doctor and your physical therapist.  Return to the emergency department if any worsening or concerning symptoms.

## 2023-06-14 ENCOUNTER — Ambulatory Visit: Payer: Medicare PPO

## 2023-06-14 DIAGNOSIS — R262 Difficulty in walking, not elsewhere classified: Secondary | ICD-10-CM

## 2023-06-14 DIAGNOSIS — M6281 Muscle weakness (generalized): Secondary | ICD-10-CM

## 2023-06-14 DIAGNOSIS — M25661 Stiffness of right knee, not elsewhere classified: Secondary | ICD-10-CM

## 2023-06-14 DIAGNOSIS — Z9889 Other specified postprocedural states: Secondary | ICD-10-CM | POA: Diagnosis not present

## 2023-06-14 DIAGNOSIS — Z8781 Personal history of (healed) traumatic fracture: Secondary | ICD-10-CM | POA: Diagnosis not present

## 2023-06-14 NOTE — Therapy (Signed)
OUTPATIENT PHYSICAL THERAPY LOWER EXTREMITY EVALUATION   Patient Name: Tracy Faulkner MRN: [id number] DOB:Nov 04, 1932, 89 y.o., female Today's Date: 06/14/2023  END OF SESSION:  PT End of Session - 06/14/23 1422     Visit Number 2    Number of Visits 16    Date for PT Re-Evaluation 08/03/23    Authorization Type [employer] M[employer]    PT Start Time 1405    PT Stop Time 1440    PT Time Calculation (min) 35 min    Activity Tolerance Patient limited by fatigue;Patient tolerated treatment well    Behavior During Therapy WFL for tasks assessed/performed              Past Medical History:  Diagnosis Date   Anemia    Arthritis    Ataxia    Back problem    Bowel trouble    Breast cancer (HCC)    Carpal tunnel syndrome    Constipation    Disturbance, sleep    Dyspnea    increased exertion    Frequent headaches    GI bleed    History of mammogram 2000   History of urinary tract infection    Hypertension    Obesity    Pain in both knees    Sepsis (HCC)    Urinary incontinence    Past Surgical History:  Procedure Laterality Date   BREAST SURGERY     for cancer-right breast   COLON SURGERY     secondary to bowel blockages   EYE SURGERY     cataract surgery bilateral    gastric stapling     KNEE ARTHROPLASTY Left 10/07/2016   Procedure: LEFT TOTAL KNEE ARTHROPLASTY WITH COMPUTER NAVIGATION;  Surgeon: Samson Frederic, MD;  Location: [facility];  Service: Orthopedics;  Laterality: Left;   ORIF FEMUR FRACTURE Right 12/13/2022   Procedure: OPEN REDUCTION INTERNAL FIXATION (ORIF) DISTAL FEMUR FRACTURE;  Surgeon: Roby Lofts, MD;  Location: [facility];  Service: Orthopedics;  Laterality: Right;   Patient Active Problem List   Diagnosis Date Noted   Femoral fracture (HCC) 12/12/2022   Fall at home, initial encounter 12/12/2022   Thrombocytopenia (HCC) 12/12/2022   Anemia due to chronic kidney disease 12/12/2022   Femur fracture, right (HCC) 12/12/2022   Closed fracture of multiple  pubic rami, initial encounter (HCC) 07/30/2022   MCI (mild cognitive impairment) 07/30/2022   Chronic kidney disease, stage 3a (HCC) 07/30/2022   DNR (do not resuscitate) 07/30/2022   Hx of breast cancer 03/12/2022   Hav (hallux abducto valgus), unspecified laterality 11/02/2021   Dyspnea    Iron deficiency anemia 10/05/2017   Vitamin D deficiency 10/05/2017   Degenerative arthritis of left knee 10/07/2016   Near syncope 12/31/2015   Chest discomfort 12/31/2015   Premature ventricular contractions 12/31/2015   Breast cancer (HCC)    Disturbance, sleep    Arthritis    Anemia    Frequent headaches    Back problem    Bowel trouble    Carpal tunnel syndrome    Ataxia    Hypertension    Pain in both knees     PCP: Ngetich, Dinah NP  REFERRING PROVIDER: Roby Lofts, MD  REFERRING DIAG: CPT 27511-Open reduction internal fixation of right distal femur fracture  THERAPY DIAG:  No diagnosis found.  Rationale for Evaluation and Treatment: Rehabilitation  ONSET DATE: 12/13/22  SUBJECTIVE:   SUBJECTIVE STATEMENT: Patient was brought to physical therapy by her daughter who reports that patient  had a fall last weekend.  They do not report any increase in symptoms, but patient reports feeling lower extremity pain today.   PERTINENT HISTORY: status post hospitalization 12/11/2022 - 12/15/2022  12/13/2022.Postop was complicated by blood loss anemia was transfused 2 units of PRBC.Hgb 7.9 on discharge.  HTN, Breast CA, A-Fibrillation, L TKR    PAIN:  Are you having pain? Yes: NPRS scale: not rated /10 Pain location: Rt knee  Pain description: tight  Aggravating factors: bending knee, walking  Relieving factors: rest   PRECAUTIONS: Fall   WEIGHT BEARING RESTRICTIONS: No  FALLS:  Has patient fallen in last 6 months? Yes. Number of falls 3-5  LIVING ENVIRONMENT: Lives with: lives with their family and lives with their daughter Lives in: House/apartment Stairs: Yes:  External: 1 steps; on right going up Has following equipment at home: Dan Humphreys - 2 wheeled  OCCUPATION: worked in [employer], was a Psychologist, sport and exercise  PLOF: Needs assistance with ADLs, Needs assistance with homemaking, Needs assistance with transfers, and Leisure: limited but was once cooking, gardening   Was living on her own in 2020 and then senior living  PATIENT GOALS: Be able to stand and cook a meal.  Rest better.   NEXT MD VISIT: Unknown  OBJECTIVE:   DIAGNOSTIC FINDINGS: See x-rays and hospital  PATIENT SURVEYS:  NT on evaluation due to time and limited cognition  COGNITION: Overall cognitive status: History of cognitive impairments - at baseline     SENSATION: Not tested  EDEMA:  LEs bilateral   MUSCLE LENGTH: NT   POSTURE: rounded shoulders, forward head, increased thoracic kyphosis, posterior pelvic tilt, and flexed trunk   PALPATION: Non TTP lateral Rt knee and thigh   LOWER EXTREMITY ROM:   Active ROM Right eval Left eval  Hip flexion Full  Full   Hip extension    Hip abduction    Hip adduction    Hip internal rotation 25 20  Hip external rotation 20 45  Knee flexion 90 105  Knee extension -10 0  Ankle dorsiflexion Tight  tight  Ankle plantarflexion    Ankle inversion    Ankle eversion     (Blank rows = not tested)  LOWER EXTREMITY MMT:  MMT Right eval Left eval  Hip flexion 4 4  Hip extension <3 <3  Hip abduction <3 <3  Hip adduction    Hip internal rotation    Hip external rotation    Knee flexion 4+ 4+  Knee extension 4+ pain  4+  Ankle dorsiflexion 4 4  Ankle plantarflexion    Ankle inversion    Ankle eversion     (Blank rows = not tested)   FUNCTIONAL TESTS:  5 times sit to stand: STS x 1 = 17 sec  Gait from reception to first treatment room > 12 min (40 feet) GAIT: Distance walked: 40 Assistive device utilized: Walker - 2 wheeled Level of assistance: Min A Comments: poor posture, reduced control of walker, safety cues,     TODAY'S TREATMENT:  [facility] Adult PT Treatment:                                                DATE: 06/14/2023  Therapeutic Exercise: NuStep x 18 minutes, including minA with transferring onto/off of the device Ambulation with rolling walking, 75 ft x 2 Intermittent rest breaks d/t fatigue    PATIENT EDUCATION:  Education details: Safety with walker, plan of care, posture Person educated: Patient and Child(ren) Education method: Explanation, Demonstration, Tactile cues, and Verbal cues Education comprehension: verbal cues required, tactile cues required, and needs further education  HOME EXERCISE PROGRAM: None at this time   ASSESSMENT:  CLINICAL IMPRESSION: Annelies was able to begin therapeutic exercise today with recumbent stepper.  She was limited by pain, fatigue, gait speed, and limited independence with transfers.  Patient will benefit from ongoing use of recumbent stepper to address mobility, activity tolerance, pain modulation.  However, the plan is to increase amount of exercise as tolerated in order to maximize rehab potential for mobility, and strength.   OBJECTIVE IMPAIRMENTS: Abnormal gait, decreased activity tolerance, decreased balance, decreased cognition, decreased coordination, decreased endurance, decreased mobility, difficulty walking, decreased ROM, decreased strength, increased edema, increased fascial restrictions, impaired flexibility, impaired UE functional use, postural dysfunction, and pain.   ACTIVITY LIMITATIONS: carrying, lifting, standing, sleeping, stairs, transfers, bed mobility, bathing, reach over head, hygiene/grooming, and locomotion level  PARTICIPATION LIMITATIONS: meal prep, cleaning, shopping, community activity, and church  PERSONAL FACTORS: Age, Time since onset of injury/illness/exacerbation, and 3+ comorbidities:  Atrial fibrillation decreased cognition left knee replacement  are also affecting patient's functional outcome.   REHAB POTENTIAL: Fair due to advanced age  CLINICAL DECISION MAKING: Evolving/moderate complexity  EVALUATION COMPLEXITY: Moderate   GOALS: Goals reviewed with patient? Yes  SHORT TERM GOALS: Target date: 07/06/2023   Patient will be screened for baseline balance deficits Baseline: Goal status: INITIAL  2.  Patient will participate in home exercise program 3 days/week to help of caregiver Baseline: Has a home health PT HEP Goal status: INITIAL  3.  Patient will improve initiation for sit to stand transfers consistently Baseline: poor Goal status: INITIAL  4.  Patient will be min assist for sit to supine transfers Baseline: mod A Goal status: INITIAL  LONG TERM GOALS: Target date: 08/03/2023    Patient will ambulate 300 feet in clinic with modified independence using rolling walker Baseline: Min assist 40 feet Goal status: INITIAL  2.  Patient will complete 5 times sit to stand in less than 1 minute using UEs to RW Baseline: 1 x 17 sec  Goal status: INITIAL  3.  Balance goal to be assessed (TUG, Berg)  Baseline: NT on eval  Goal status: INITIAL  4.  Patient will demonstrate right knee flexion to 100 degrees in order to improve transfers Baseline: 90 degrees Goal status: INITIAL  5.  Patient will be independent with home exercise program upon discharge Baseline:  Goal status: INITIAL  PLAN:  PT FREQUENCY: 2x/week  PT DURATION: 8 weeks  PLANNED INTERVENTIONS: Therapeutic exercises, Therapeutic activity, Neuromuscular re-education, Balance training, Gait training, Patient/Family education, Self Care, DME instructions, Cryotherapy, Moist heat, Manual therapy, and Re-evaluation  PLAN FOR NEXT SESSION: TUG, sit to stand , NuStep gentle level 1 to supine lower extremity ther ex , HEP    Mauri Reading, PT, DPT 06/14/2023, 7:00 PM  Referring  diagnosis? CPT 27511-Open reduction internal fixation of right distal femur fracture Treatment diagnosis? (if different than referring diagnosis) difficulty walking, Rt knee stiffness, weakness  What was this (referring dx) caused by? [x]  Surgery [x]  Fall []  Ongoing issue []  Arthritis []  Other: ____________  Laterality: [x]  Rt []  Lt []  Both  Check all possible CPT codes:  *CHOOSE 10 OR LESS*    []  97110 (Therapeutic Exercise)  []  92507 (SLP Treatment)  []  97112 (Neuro Re-ed)   []  92526 (Swallowing Treatment)   []  97116 (Gait Training)   []  [id number] (Cognitive Training, 1st 15 minutes) []  97140 (Manual Therapy)   []  97130 (Cognitive Training, each add'l 15 minutes)  []  97164 (Re-evaluation)                              []  Other, List CPT Code ____________  []  97530 (Therapeutic Activities)     []  97535 (Self Care)   [x]  All codes above (97110 - 97535)  []  97012 (Mechanical Traction)  []  97014 (E-stim Unattended)  []  97032 (E-stim manual)  []  97033 (Ionto)  []  97035 (Ultrasound) [x]  97750 (Physical Performance Training) []  [id number] (Aquatic Therapy) []  97016 (Vasopneumatic Device) []  [id number] (Paraffin) []  97034 (Contrast Bath) []  97597 (Wound Care 1st 20 sq cm) []  97598 (Wound Care each add'l 20 sq cm) []  97760 (Orthotic Fabrication, Fitting, Training Initial) []  [id number] (Prosthetic Management and Training Initial) []  [id number] (Orthotic or Prosthetic Training/ Modification Subsequent)

## 2023-06-16 ENCOUNTER — Ambulatory Visit: Payer: Medicare PPO

## 2023-06-20 ENCOUNTER — Encounter: Payer: Self-pay | Admitting: Family

## 2023-06-20 ENCOUNTER — Ambulatory Visit (INDEPENDENT_AMBULATORY_CARE_PROVIDER_SITE_OTHER): Payer: Medicare PPO | Admitting: Family

## 2023-06-20 VITALS — BP 132/80 | HR 71 | Temp 97.8°F | Resp 18 | Ht 64.0 in | Wt 145.0 lb

## 2023-06-20 DIAGNOSIS — R2681 Unsteadiness on feet: Secondary | ICD-10-CM | POA: Diagnosis not present

## 2023-06-20 DIAGNOSIS — D509 Iron deficiency anemia, unspecified: Secondary | ICD-10-CM | POA: Diagnosis not present

## 2023-06-20 DIAGNOSIS — I10 Essential (primary) hypertension: Secondary | ICD-10-CM

## 2023-06-20 LAB — CBC WITH DIFFERENTIAL/PLATELET
Absolute Monocytes: 504 cells/uL (ref 200–950)
Basophils Absolute: 52 cells/uL (ref 0–200)
Basophils Relative: 1 %
Eosinophils Absolute: 78 cells/uL (ref 15–500)
Eosinophils Relative: 1.5 %
HCT: 32.6 % — ABNORMAL LOW (ref 35.0–45.0)
Hemoglobin: 10.3 g/dL — ABNORMAL LOW (ref 11.7–15.5)
Lymphs Abs: 1581 cells/uL (ref 850–3900)
MCH: 25.8 pg — ABNORMAL LOW (ref 27.0–33.0)
MCHC: 31.6 g/dL — ABNORMAL LOW (ref 32.0–36.0)
MCV: 81.7 fL (ref 80.0–100.0)
MPV: 11.4 fL (ref 7.5–12.5)
Monocytes Relative: 9.7 %
Neutro Abs: 2985 cells/uL (ref 1500–7800)
Neutrophils Relative %: 57.4 %
Platelets: 160 10*3/uL (ref 140–400)
RBC: 3.99 10*6/uL (ref 3.80–5.10)
RDW: 15.9 % — ABNORMAL HIGH (ref 11.0–15.0)
Total Lymphocyte: 30.4 %
WBC: 5.2 10*3/uL (ref 3.8–10.8)

## 2023-06-20 NOTE — Progress Notes (Signed)
Provider: Richarda Blade FNP-C   Tracy Faulkner, Donalee Citrin, NP  Patient Care Team: Sanyia Dini, Donalee Citrin, NP as PCP - General (Family Medicine) Lyn Records, MD (Inactive) as PCP - Cardiology (Cardiology) Lyn Records, MD (Inactive) as Consulting Physician (Cardiology) Khori Underberg, Donalee Citrin, NP (Family Medicine)  Extended Emergency Contact Information Primary Emergency Contact: Tomasa Hose [place] Home Phone: [phone] Mobile Phone: [phone] Relation: Daughter Secondary Emergency Contact: Joyce Eisenberg [facility] Phone: [phone] Relation: Daughter  Code Status:  Full Code  Goals of care: Advanced Directive information    06/20/2023    3:39 PM  Advanced Directives  Does Patient Have a Medical Advance Directive? No  Would patient like information on creating a medical advance directive? No - Patient declined     Chief Complaint  Patient presents with   Medical Management of Chronic Issues    Patient is here for a 40M F/U for chronic conditions    HPI:  Pt is a 89 y.o. female seen today for 6 months follow up for medical management of chronic diseases.    She is here with her daughter.states appetite is okay. Has had a fall 03/27/2023 and 06/11/2023 currently working with outpatient physical Therapy.  Anemia - Hgb 10.3 previous 10.1  Past Medical History:  Diagnosis Date   Anemia    Arthritis    Ataxia    Back problem    Bowel trouble    Breast cancer (HCC)    Carpal tunnel syndrome    Constipation    Disturbance, sleep    Dyspnea    increased exertion    Frequent headaches    GI bleed    History of mammogram 2000   History of urinary tract infection    Hypertension    Obesity    Pain in both knees    Sepsis (HCC)    Urinary incontinence    Past Surgical History:  Procedure Laterality Date   BREAST SURGERY     for cancer-right breast   COLON SURGERY     secondary to bowel blockages   EYE SURGERY     cataract surgery bilateral     gastric stapling     KNEE ARTHROPLASTY Left 10/07/2016   Procedure: LEFT TOTAL KNEE ARTHROPLASTY WITH COMPUTER NAVIGATION;  Surgeon: Samson Frederic, MD;  Location: [facility];  Service: Orthopedics;  Laterality: Left;   ORIF FEMUR FRACTURE Right 12/13/2022   Procedure: OPEN REDUCTION INTERNAL FIXATION (ORIF) DISTAL FEMUR FRACTURE;  Surgeon: Roby Lofts, MD;  Location: [facility] OR;  Service: Orthopedics;  Laterality: Right;    Allergies  Allergen Reactions   Ace Inhibitors Cough   Lortab [Hydrocodone-Acetaminophen] Other (See Comments)    Bad dreams   Remeron [Mirtazapine] Other (See Comments)    Hallucinations (can take 1/2 tablet--7.5 mg), not 15 mg   Penicillins Rash and Other (See Comments)    Throat swelling, Has patient had a PCN reaction causing immediate rash, facial/tongue/throat swelling, SOB or lightheadedness with hypotension: Yes Has patient had a PCN reaction causing severe rash involving mucus membranes or skin necrosis: No Has patient had a PCN reaction that required hospitalization No Has patient had a PCN reaction occurring within the last 10 years: No If all of the above answers are "NO", then may proceed with Cephalosporin use.     Allergies as of 06/20/2023       Reactions   Ace Inhibitors Cough   Lortab [hydrocodone-acetaminophen] Other (See Comments)   Bad dreams   Remeron [  mirtazapine] Other (See Comments)   Hallucinations (can take 1/2 tablet--7.5 mg), not 15 mg   Penicillins Rash, Other (See Comments)   Throat swelling, Has patient had a PCN reaction causing immediate rash, facial/tongue/throat swelling, SOB or lightheadedness with hypotension: Yes Has patient had a PCN reaction causing severe rash involving mucus membranes or skin necrosis: No Has patient had a PCN reaction that required hospitalization No Has patient had a PCN reaction occurring within the last 10 years: No If all of the above answers are "NO", then may proceed with Cephalosporin use.         Medication List        Accurate as of June 20, 2023  4:17 PM. If you have any questions, ask your nurse or doctor.          ACETAMINOPHEN ER PO Take 500 mg by mouth daily as needed for pain.   bisacodyl 5 MG EC tablet Commonly known as: DULCOLAX Take 1 tablet (5 mg total) by mouth daily as needed for moderate constipation.   CALCIUM PO Take 1 capsule by mouth daily.   cycloSPORINE 0.05 % ophthalmic emulsion Commonly known as: RESTASIS Place 1 drop into both eyes daily.   docusate sodium 100 MG capsule Commonly known as: COLACE Take 1 capsule (100 mg total) by mouth 2 (two) times daily.   FISH OIL PO Take 1 capsule by mouth daily.   Iron (Ferrous Sulfate) 325 (65 Fe) MG Tabs Take 325 mg by mouth daily. D64.9   losartan 50 MG tablet Commonly known as: COZAAR Take 1 tablet (50 mg total) by mouth daily.   multivitamin tablet Take 1 tablet by mouth daily.   OVER THE COUNTER MEDICATION Take 1 capsule by mouth daily. Gluco Gel Joint & Cartilage Support   OVER THE COUNTER MEDICATION Apply 1 application  topically daily as needed (pain relief). Youngevity Pain Cream   oxyCODONE 5 MG immediate release tablet Commonly known as: Oxy IR/ROXICODONE Take 1 tablet (5 mg total) by mouth every 6 (six) hours as needed for moderate pain or severe pain.   polyethylene glycol 17 g packet Commonly known as: MIRALAX / GLYCOLAX Take 17 g by mouth daily as needed for mild constipation.        Review of Systems  Constitutional:  Negative for appetite change, chills, fatigue, fever and unexpected weight change.  HENT:  Negative for congestion, dental problem, ear discharge, ear pain, facial swelling, hearing loss, nosebleeds, postnasal drip, rhinorrhea, sinus pressure, sinus pain, sneezing, sore throat, tinnitus and trouble swallowing.   Eyes:  Negative for pain, discharge, redness, itching and visual disturbance.  Respiratory:  Negative for cough, chest tightness, shortness of  breath and wheezing.   Cardiovascular:  Negative for chest pain, palpitations and leg swelling.  Gastrointestinal:  Negative for abdominal distention, abdominal pain, blood in stool, constipation, diarrhea, nausea and vomiting.  Endocrine: Negative for cold intolerance, heat intolerance, polydipsia, polyphagia and polyuria.  Genitourinary:  Negative for difficulty urinating, dysuria, flank pain, frequency and urgency.  Musculoskeletal:  Positive for arthralgias and gait problem. Negative for back pain, joint swelling, myalgias, neck pain and neck stiffness.  Skin:  Negative for color change, pallor, rash and wound.  Neurological:  Negative for dizziness, syncope, speech difficulty, weakness, light-headedness, numbness and headaches.  Hematological:  Does not bruise/bleed easily.  Psychiatric/Behavioral:  Negative for agitation, behavioral problems, confusion, hallucinations, self-injury, sleep disturbance and suicidal ideas. The patient is not nervous/anxious.     Immunization History  Administered Date(s)  Administered   Fluad Quad(high Dose 65+) 09/07/2021, 09/24/2022   Influenza, High Dose Seasonal PF 08/13/2019   Influenza,inj,quad, With Preservative 09/15/2017   Janssen (J&J) SARS-COV-2 Vaccination 02/03/2020   Pneumococcal Conjugate-13 08/18/2015   Pneumococcal Polysaccharide-23 12/13/2017   Tdap 10/11/2017   Zoster Recombinant(Shingrix) 10/11/2017, 12/13/2017   Zoster, Live 10/12/2017   Pertinent  Health Maintenance Due  Topic Date Due   INFLUENZA VACCINE  06/30/2023   DEXA SCAN  Completed      12/12/2022    8:53 PM 01/05/2023    3:23 PM 02/24/2023   10:28 AM 05/23/2023    3:52 PM 06/20/2023    3:39 PM  Fall Risk  Falls in the past year?  1 0 1 1  Was there an injury with Fall?  1 0 1 1  Fall Risk Category Calculator  3 0 3 3  (RETIRED) Patient Fall Risk Level High fall risk      Patient at Risk for Falls Due to  History of fall(s);Impaired balance/gait;Impaired mobility  History of fall(s);Impaired balance/gait;Impaired mobility History of fall(s);Impaired balance/gait;Impaired mobility History of fall(s);Impaired balance/gait;Impaired mobility  Fall risk Follow up  Falls evaluation completed   Falls evaluation completed   Functional Status Survey:    Vitals:   06/20/23 1540  BP: 132/80  Pulse: 71  Resp: 18  Temp: 97.8 F (36.6 C)  Weight: 145 lb (65.8 kg)  Height: 5\' 4"  (1.626 m)   Body mass index is 24.89 kg/m. Physical Exam Vitals reviewed.  Constitutional:      General: She is not in acute distress.    Appearance: Normal appearance. She is normal weight. She is not ill-appearing or diaphoretic.  HENT:     Head: Normocephalic.     Right Ear: Tympanic membrane, ear canal and external ear normal. There is no impacted cerumen.     Left Ear: Tympanic membrane, ear canal and external ear normal. There is no impacted cerumen.     Nose: Nose normal. No congestion or rhinorrhea.     Mouth/Throat:     Mouth: Mucous membranes are moist.     Pharynx: Oropharynx is clear. No oropharyngeal exudate or posterior oropharyngeal erythema.  Eyes:     General: No scleral icterus.       Right eye: No discharge.        Left eye: No discharge.     Extraocular Movements: Extraocular movements intact.     Conjunctiva/sclera: Conjunctivae normal.     Pupils: Pupils are equal, round, and reactive to light.  Neck:     Vascular: No carotid bruit.  Cardiovascular:     Rate and Rhythm: Normal rate and regular rhythm.     Pulses: Normal pulses.     Heart sounds: Normal heart sounds. No murmur heard.    No friction rub. No gallop.  Pulmonary:     Effort: Pulmonary effort is normal. No respiratory distress.     Breath sounds: Normal breath sounds. No wheezing, rhonchi or rales.  Chest:     Chest wall: No tenderness.  Abdominal:     General: Bowel sounds are normal. There is no distension.     Palpations: Abdomen is soft. There is no mass.     Tenderness: There  is no abdominal tenderness. There is no right CVA tenderness, left CVA tenderness, guarding or rebound.  Musculoskeletal:        General: No swelling or tenderness. Normal range of motion.     Cervical back: Normal range of motion.  No rigidity or tenderness.     Right lower leg: No edema.     Left lower leg: No edema.  Lymphadenopathy:     Cervical: No cervical adenopathy.  Skin:    General: Skin is warm and dry.     Coloration: Skin is not pale.     Findings: No bruising, erythema, lesion or rash.  Neurological:     Mental Status: She is alert and oriented to person, place, and time. Mental status is at baseline.     Cranial Nerves: No cranial nerve deficit.     Sensory: No sensory deficit.     Motor: No weakness.     Coordination: Coordination normal.     Gait: Gait abnormal.  Psychiatric:        Mood and Affect: Mood normal.        Speech: Speech normal.        Behavior: Behavior normal.        Thought Content: Thought content normal.    Labs reviewed: Recent Labs    12/13/22 0011 12/14/22 0657 12/15/22 0653 01/05/23 1552 03/27/23 1347 06/11/23 2118  NA 147* 138 139 140 140 140  K 4.3 4.1 3.7 4.4 3.8 4.1  CL 113* 107 111 108 108 109  CO2 23 24 24 22 23 23   GLUCOSE 66* 93 81 88 88 104*  BUN 22 26* 23 21 18 17   CREATININE 0.96 1.17* 0.90 1.02* 0.95 1.09*  CALCIUM 9.5 8.2* 8.3* 9.7 9.3 9.1  MG 2.2 1.9 2.0  --   --   --    Recent Labs    07/30/22 0626 12/12/22 0207 03/27/23 1347  AST 28 30 23   ALT 18 24 17   ALKPHOS 60 55 89  BILITOT 0.9 0.4 0.8  PROT 7.0 5.8* 6.2*  ALBUMIN 3.5 3.0* 3.2*   Recent Labs    12/12/22 0207 12/13/22 0011 01/05/23 1552 03/27/23 1036 06/11/23 2118  WBC 8.8   < > 5.0 4.3 4.7  NEUTROABS 7.6  --  3,300  --  3.2  HGB 8.6*   < > 9.5* 10.1* 10.3*  HCT 27.1*   < > 30.5* 32.3* 33.1*  MCV 80.9   < > 85.7 80.1 83.8  PLT 141*   < > 245 149* 150   < > = values in this interval not displayed.   Lab Results  Component Value Date    TSH 0.304 (L) 12/12/2022   No results found for: "HGBA1C" Lab Results  Component Value Date   CHOL 195 03/08/2022   HDL 59 03/08/2022   LDLCALC 122 (H) 03/08/2022   TRIG 57 03/08/2022   CHOLHDL 3.3 03/08/2022    Significant Diagnostic Results in last 30 days:  CT Hip Left Wo Contrast  Result Date: 06/11/2023 CLINICAL DATA:  Recent fall with cortical irregularity on plain film in the left femoral neck EXAM: CT OF THE LEFT HIP WITHOUT CONTRAST TECHNIQUE: Multidetector CT imaging of the left hip was performed according to the standard protocol. Multiplanar CT image reconstructions were also generated. RADIATION DOSE REDUCTION: This exam was performed according to the departmental dose-optimization program which includes automated exposure control, adjustment of the mA and/or kV according to patient size and/or use of iterative reconstruction technique. COMPARISON:  Plain film from earlier in the same day. FINDINGS: Bones/Joint/Cartilage Changes are again noted consistent with healed right pubic rami fractures. No acute fracture is noted on the left. Specifically, the area of abnormality on recent plain film is not borne  out on the CT. No other cortical abnormality is seen. No joint effusion is noted. Ligaments Suboptimally assessed by CT. Muscles and Tendons Surrounding musculature appears within normal limits. Soft tissues Diverticular change of the colon is noted without diverticulitis. No soft tissue hematoma is noted. IMPRESSION: No evidence of acute fracture is noted in the left proximal femur. Healed right pubic rami fractures. Diverticulosis without diverticulitis. Electronically Signed   By: Alcide Clever M.D.   On: 06/11/2023 22:32   CT Head Wo Contrast  Result Date: 06/11/2023 CLINICAL DATA:  Head trauma EXAM: CT HEAD WITHOUT CONTRAST TECHNIQUE: Contiguous axial images were obtained from the base of the skull through the vertex without intravenous contrast. RADIATION DOSE REDUCTION: This  exam was performed according to the departmental dose-optimization program which includes automated exposure control, adjustment of the mA and/or kV according to patient size and/or use of iterative reconstruction technique. COMPARISON:  CT head 12/11/2022 FINDINGS: Brain: No intracranial hemorrhage, mass effect, or evidence of acute infarct. No hydrocephalus. No extra-axial fluid collection. Generalized cerebral atrophy. Ill-defined hypoattenuation within the cerebral white matter is nonspecific but consistent with chronic small vessel ischemic disease. Vascular: No hyperdense vessel. Intracranial arterial calcification. Skull: No fracture or focal lesion. Sinuses/Orbits: No acute finding. Paranasal sinuses and mastoid air cells are well aerated. Other: None. IMPRESSION: No acute intracranial abnormality. Electronically Signed   By: Minerva Fester M.D.   On: 06/11/2023 22:28   DG Hip Unilat With Pelvis 2-3 Views Left  Result Date: 06/11/2023 CLINICAL DATA:  Fall, left hip pain EXAM: DG HIP (WITH OR WITHOUT PELVIS) 2-3V LEFT COMPARISON:  03/27/2023 FINDINGS: Lower lumbar spondylosis. Prominent left and moderate right axial loss of articular space. Old deformity of the right pubic rami likely from old healed fracture. Subtle contour irregularity of the lateral junction of the left femoral head and neck on the AP pelvis view compared to the 03/27/2023 exam. Although subtle and not well appreciated on the other projections, this could be an indicator of a nondisplaced femoral neck fracture; cross-sectional imaging of the bony pelvis to include the left hip is recommended for further characterization. IMPRESSION: 1. Subtle contour irregularity of the lateral junction of the left femoral head and neck on the AP pelvis view compared to the 03/27/2023 exam. Although subtle and not well appreciated on the other projections, this could be an indicator of a nondisplaced femoral neck fracture; cross-sectional imaging of  the bony pelvis to include the left hip is recommended for further characterization. 2. Old healed right pubic rami fracture. 3. Lower lumbar spondylosis. 4. Prominent left and moderate right axial loss of articular space. Electronically Signed   By: Gaylyn Rong M.D.   On: 06/11/2023 20:34   DG Chest 1 View  Result Date: 06/11/2023 CLINICAL DATA:  Fall, pain EXAM: CHEST  1 VIEW COMPARISON:  11/13/2016 FINDINGS: Atherosclerotic calcification of the aortic arch. Thoracic spondylosis. Mild enlargement of the cardiopericardial silhouette, without edema. Bony demineralization. The lungs appear clear. IMPRESSION: 1. Mild enlargement of the cardiopericardial silhouette, without edema. 2. Bony demineralization. 3. Thoracic spondylosis. Electronically Signed   By: Gaylyn Rong M.D.   On: 06/11/2023 20:31    Assessment/Plan 1. Iron deficiency anemia, unspecified iron deficiency anemia type Hgb  still low but stable Hgb 10.3 previous 10.1 - CBC with Differential/Platelet  2. Essential hypertension Blood pressure well-controlled -Continue losartan -Continue to monitor blood pressure at home and notify provider if greater than 140/90 -Continue to cut down on salt intake  3. Unsteady  gait Fall and safety precaution advised  Family/ staff Communication: Reviewed plan of care with patient verbalized understanding  Labs/tests ordered: None   Next Appointment : Return in about 6 months (around 12/21/2023) for medical mangement of chronic issues., Fasting labs in 6 months prior to visit.   Caesar Bookman, NP

## 2023-06-29 ENCOUNTER — Ambulatory Visit: Payer: Medicare PPO

## 2023-06-30 ENCOUNTER — Ambulatory Visit: Payer: Medicare PPO | Attending: Family

## 2023-06-30 DIAGNOSIS — M25661 Stiffness of right knee, not elsewhere classified: Secondary | ICD-10-CM | POA: Insufficient documentation

## 2023-06-30 DIAGNOSIS — Z9889 Other specified postprocedural states: Secondary | ICD-10-CM | POA: Insufficient documentation

## 2023-06-30 DIAGNOSIS — Z8781 Personal history of (healed) traumatic fracture: Secondary | ICD-10-CM | POA: Insufficient documentation

## 2023-06-30 DIAGNOSIS — R262 Difficulty in walking, not elsewhere classified: Secondary | ICD-10-CM | POA: Insufficient documentation

## 2023-06-30 DIAGNOSIS — M6281 Muscle weakness (generalized): Secondary | ICD-10-CM | POA: Insufficient documentation

## 2023-07-02 ENCOUNTER — Ambulatory Visit: Payer: Medicare PPO

## 2023-07-05 DIAGNOSIS — S72451D Displaced supracondylar fracture without intracondylar extension of lower end of right femur, subsequent encounter for closed fracture with routine healing: Secondary | ICD-10-CM | POA: Diagnosis not present

## 2023-07-06 ENCOUNTER — Ambulatory Visit: Payer: Medicare PPO

## 2023-07-06 DIAGNOSIS — M6281 Muscle weakness (generalized): Secondary | ICD-10-CM

## 2023-07-06 DIAGNOSIS — M25661 Stiffness of right knee, not elsewhere classified: Secondary | ICD-10-CM | POA: Diagnosis not present

## 2023-07-06 DIAGNOSIS — Z8781 Personal history of (healed) traumatic fracture: Secondary | ICD-10-CM

## 2023-07-06 DIAGNOSIS — R262 Difficulty in walking, not elsewhere classified: Secondary | ICD-10-CM

## 2023-07-06 DIAGNOSIS — Z9889 Other specified postprocedural states: Secondary | ICD-10-CM | POA: Diagnosis not present

## 2023-07-06 NOTE — Therapy (Signed)
OUTPATIENT PHYSICAL THERAPY TREATMENT and discharge summary   Patient Name: Tracy Faulkner MRN: [id number] DOB:10/05/1932, 89 y.o., female Today's Date: 07/06/2023  END OF SESSION:  PT End of Session - 07/06/23 1600     Visit Number 3    Number of Visits 16    Date for PT Re-Evaluation 08/03/23    Authorization Type [employer] M[employer]    PT Start Time 1610    PT Stop Time 1645    PT Time Calculation (min) 35 min    Activity Tolerance Patient limited by fatigue;Patient tolerated treatment well    Behavior During Therapy WFL for tasks assessed/performed               Past Medical History:  Diagnosis Date   Anemia    Arthritis    Ataxia    Back problem    Bowel trouble    Breast cancer (HCC)    Carpal tunnel syndrome    Constipation    Disturbance, sleep    Dyspnea    increased exertion    Frequent headaches    GI bleed    History of mammogram 2000   History of urinary tract infection    Hypertension    Obesity    Pain in both knees    Sepsis (HCC)    Urinary incontinence    Past Surgical History:  Procedure Laterality Date   BREAST SURGERY     for cancer-right breast   COLON SURGERY     secondary to bowel blockages   EYE SURGERY     cataract surgery bilateral    gastric stapling     KNEE ARTHROPLASTY Left 10/07/2016   Procedure: LEFT TOTAL KNEE ARTHROPLASTY WITH COMPUTER NAVIGATION;  Surgeon: Samson Frederic, MD;  Location: [facility];  Service: Orthopedics;  Laterality: Left;   ORIF FEMUR FRACTURE Right 12/13/2022   Procedure: OPEN REDUCTION INTERNAL FIXATION (ORIF) DISTAL FEMUR FRACTURE;  Surgeon: Roby Lofts, MD;  Location: [facility];  Service: Orthopedics;  Laterality: Right;   Patient Active Problem List   Diagnosis Date Noted   Femoral fracture (HCC) 12/12/2022   Fall at home, initial encounter 12/12/2022   Thrombocytopenia (HCC) 12/12/2022   Anemia due to chronic kidney disease 12/12/2022   Femur fracture, right (HCC) 12/12/2022   Closed fracture of  multiple pubic rami, initial encounter (HCC) 07/30/2022   MCI (mild cognitive impairment) 07/30/2022   Chronic kidney disease, stage 3a (HCC) 07/30/2022   DNR (do not resuscitate) 07/30/2022   Hx of breast cancer 03/12/2022   Hav (hallux abducto valgus), unspecified laterality 11/02/2021   Dyspnea    Iron deficiency anemia 10/05/2017   Vitamin D deficiency 10/05/2017   Degenerative arthritis of left knee 10/07/2016   Near syncope 12/31/2015   Chest discomfort 12/31/2015   Premature ventricular contractions 12/31/2015   Breast cancer (HCC)    Disturbance, sleep    Arthritis    Anemia    Frequent headaches    Back problem    Bowel trouble    Carpal tunnel syndrome    Ataxia    Hypertension    Pain in both knees     PHYSICAL THERAPY DISCHARGE SUMMARY  Visits from Start of Care: 3  Current functional level related to goals / functional outcomes: See objective findings/assessment    Remaining deficits: See objective findings/assessment   Education / Equipment: See objective findings/assessment    Patient agrees to discharge. Patient goals were not met. Patient is being discharged due to  patient receiving home health physical therapy services starting 07/10/2023.    PCP: Richarda Blade NP  REFERRING PROVIDER: Roby Lofts, MD  REFERRING DIAG: CPT [phone] reduction internal fixation of right distal femur fracture  THERAPY DIAG:  Muscle weakness (generalized)  Difficulty walking  Stiffness of right knee, not elsewhere classified  S/P ORIF (open reduction internal fixation) fracture  Rationale for Evaluation and Treatment: Rehabilitation  ONSET DATE: 12/13/22  SUBJECTIVE:   SUBJECTIVE STATEMENT:  Patient reports to physical therapy with her daughter who states that she has had at least 2 falls since she was last seen in our clinic.  She states that they have seen orthopedic since these falls will confirm no additional injuries.  At end of session, her  daughter reports that she will be starting home health physical therapy this weekend.  Daughter confirms understanding that she will be discharged from outpatient therapy today.  PERTINENT HISTORY: status post hospitalization 12/11/2022 - 12/15/2022  12/13/2022.Postop was complicated by blood loss anemia was transfused 2 units of PRBC.Hgb 7.9 on discharge.  HTN, Breast CA, A-Fibrillation, L TKR    PAIN:  Are you having pain? Yes: NPRS scale: not rated /10 Pain location: Rt knee  Pain description: tight  Aggravating factors: bending knee, walking  Relieving factors: rest   PRECAUTIONS: Fall   WEIGHT BEARING RESTRICTIONS: No  FALLS:  Has patient fallen in last 6 months? Yes. Number of falls 3-5  LIVING ENVIRONMENT: Lives with: lives with their family and lives with their daughter Lives in: House/apartment Stairs: Yes: External: 1 steps; on right going up Has following equipment at home: Dan Humphreys - 2 wheeled  OCCUPATION: worked in [employer], was a Psychologist, sport and exercise  PLOF: Needs assistance with ADLs, Needs assistance with homemaking, Needs assistance with transfers, and Leisure: limited but was once cooking, gardening   Was living on her own in 2020 and then senior living  PATIENT GOALS: Be able to stand and cook a meal.  Rest better.   NEXT MD VISIT: Unknown  OBJECTIVE:   DIAGNOSTIC FINDINGS: See x-rays and hospital  PATIENT SURVEYS:  NT on evaluation due to time and limited cognition  COGNITION: Overall cognitive status: History of cognitive impairments - at baseline     SENSATION: Not tested  EDEMA:  LEs bilateral   MUSCLE LENGTH: NT   POSTURE: rounded shoulders, forward head, increased thoracic kyphosis, posterior pelvic tilt, and flexed trunk   PALPATION: Non TTP lateral Rt knee and thigh   LOWER EXTREMITY ROM:   Active ROM Right eval Left eval  Hip flexion Full  Full   Hip extension    Hip abduction    Hip adduction    Hip internal rotation 25 20  Hip  external rotation 20 45  Knee flexion 90 105  Knee extension -10 0  Ankle dorsiflexion Tight  tight  Ankle plantarflexion    Ankle inversion    Ankle eversion     (Blank rows = not tested)  LOWER EXTREMITY MMT:  MMT Right eval Left eval  Hip flexion 4 4  Hip extension <3 <3  Hip abduction <3 <3  Hip adduction    Hip internal rotation    Hip external rotation    Knee flexion 4+ 4+  Knee extension 4+ pain  4+  Ankle dorsiflexion 4 4  Ankle plantarflexion    Ankle inversion    Ankle eversion     (Blank rows = not tested)   FUNCTIONAL TESTS:  5 times sit to stand:  STS x 1 = 17 sec  Gait from reception to first treatment room > 12 min (40 feet) GAIT: Distance walked: 40 Assistive device utilized: Environmental consultant - 2 wheeled Level of assistance: Min A Comments: poor posture, reduced control of walker, safety cues,    TODAY'S TREATMENT:               [facility] Adult PT Treatment:                                                DATE: 07/06/2023  Therapeutic Exercise: Seated ball squeeze, 2 x 10 each Seated marches, 2 x 10 each LE Seated hip abduction, 2 x 10 each LE Seated portable stepper/bike x 4 min with min A Created and reviewed home exercise program   Therapeutic activity: Gait training including cueing for placement of walker, standing within base of support, gait speed, upright posture. Sit to stand x 2 with min assist                                                                                                                  [facility] Adult PT Treatment:                                                DATE: 06/14/2023  Therapeutic Exercise: NuStep x 18 minutes, including minA with transferring onto/off of the device Ambulation with rolling walking, 75 ft x 2 Intermittent rest breaks d/t fatigue    PATIENT EDUCATION:  Education details: Safety with walker, plan of care, posture Person educated: Patient and Child(ren) Education method: Explanation, Demonstration,  Tactile cues, and Verbal cues Education comprehension: verbal cues required, tactile cues required, and needs further education  HOME EXERCISE PROGRAM: None at this time   ASSESSMENT:  CLINICAL IMPRESSION: Kestra was able to tolerate more activity today, including lower extremity strengthening while seated.  She continues to demonstrate slow gait speed and requires cueing for improved speed and mechanics.  She requires min assist for sit to stand transfer.  Provided patient with home exercise program for ongoing improvement at home.  Patient will be discharged from skilled physical therapy services at this time due to beginning home health physical therapy this weekend.  Formal assessment of progress towards goals not performed at this time due to being informed of home health therapy at end of session by patient's daughter.  I agree that patient will likely benefit more from home health therapy, with improved frequency and longer treatment times, in order to maximize rehab potential.  OBJECTIVE IMPAIRMENTS: Abnormal gait, decreased activity tolerance, decreased balance, decreased cognition, decreased coordination, decreased endurance, decreased mobility, difficulty walking, decreased ROM, decreased strength, increased edema, increased fascial restrictions, impaired flexibility, impaired UE functional use, postural dysfunction, and  pain.   ACTIVITY LIMITATIONS: carrying, lifting, standing, sleeping, stairs, transfers, bed mobility, bathing, reach over head, hygiene/grooming, and locomotion level  PARTICIPATION LIMITATIONS: meal prep, cleaning, shopping, community activity, and church  PERSONAL FACTORS: Age, Time since onset of injury/illness/exacerbation, and 3+ comorbidities: Atrial fibrillation decreased cognition left knee replacement  are also affecting patient's functional outcome.   REHAB POTENTIAL: Fair due to advanced age  CLINICAL DECISION MAKING: Evolving/moderate  complexity  EVALUATION COMPLEXITY: Moderate   GOALS: Goals reviewed with patient? Yes  SHORT TERM GOALS: Target date: 07/06/2023   Patient will be screened for baseline balance deficits Baseline: Goal status: INITIAL  2.  Patient will participate in home exercise program 3 days/week to help of caregiver Baseline: Has a home health PT HEP Goal status: INITIAL  3.  Patient will improve initiation for sit to stand transfers consistently Baseline: poor Goal status: INITIAL  4.  Patient will be min assist for sit to supine transfers Baseline: mod A Goal status: INITIAL  LONG TERM GOALS: Target date: 08/03/2023    Patient will ambulate 300 feet in clinic with modified independence using rolling walker Baseline: Min assist 40 feet Goal status: INITIAL  2.  Patient will complete 5 times sit to stand in less than 1 minute using UEs to RW Baseline: 1 x 17 sec  Goal status: INITIAL  3.  Balance goal to be assessed (TUG, Berg)  Baseline: NT on eval  Goal status: INITIAL  4.  Patient will demonstrate right knee flexion to 100 degrees in order to improve transfers Baseline: 90 degrees Goal status: INITIAL  5.  Patient will be independent with home exercise program upon discharge Baseline:  Goal status: INITIAL  PLAN:  PT FREQUENCY: 2x/week  PT DURATION: 8 weeks  PLANNED INTERVENTIONS: Therapeutic exercises, Therapeutic activity, Neuromuscular re-education, Balance training, Gait training, Patient/Family education, Self Care, DME instructions, Cryotherapy, Moist heat, Manual therapy, and Re-evaluation  PLAN FOR NEXT SESSION: TUG, sit to stand , NuStep gentle level 1 to supine lower extremity ther ex , HEP    Mauri Reading, PT, DPT 07/06/2023, 6:55 PM

## 2023-07-10 DIAGNOSIS — Z9181 History of falling: Secondary | ICD-10-CM | POA: Diagnosis not present

## 2023-07-10 DIAGNOSIS — G5600 Carpal tunnel syndrome, unspecified upper limb: Secondary | ICD-10-CM | POA: Diagnosis not present

## 2023-07-10 DIAGNOSIS — I10 Essential (primary) hypertension: Secondary | ICD-10-CM | POA: Diagnosis not present

## 2023-07-10 DIAGNOSIS — M1990 Unspecified osteoarthritis, unspecified site: Secondary | ICD-10-CM | POA: Diagnosis not present

## 2023-07-10 DIAGNOSIS — Z96653 Presence of artificial knee joint, bilateral: Secondary | ICD-10-CM | POA: Diagnosis not present

## 2023-07-10 DIAGNOSIS — K5900 Constipation, unspecified: Secondary | ICD-10-CM | POA: Diagnosis not present

## 2023-07-10 DIAGNOSIS — S72451D Displaced supracondylar fracture without intracondylar extension of lower end of right femur, subsequent encounter for closed fracture with routine healing: Secondary | ICD-10-CM | POA: Diagnosis not present

## 2023-07-10 DIAGNOSIS — Z556 Problems related to health literacy: Secondary | ICD-10-CM | POA: Diagnosis not present

## 2023-07-10 DIAGNOSIS — D649 Anemia, unspecified: Secondary | ICD-10-CM | POA: Diagnosis not present

## 2023-07-12 ENCOUNTER — Other Ambulatory Visit: Payer: Self-pay | Admitting: Family

## 2023-07-12 DIAGNOSIS — I10 Essential (primary) hypertension: Secondary | ICD-10-CM

## 2023-07-19 DIAGNOSIS — Z556 Problems related to health literacy: Secondary | ICD-10-CM | POA: Diagnosis not present

## 2023-07-19 DIAGNOSIS — K5900 Constipation, unspecified: Secondary | ICD-10-CM | POA: Diagnosis not present

## 2023-07-19 DIAGNOSIS — I10 Essential (primary) hypertension: Secondary | ICD-10-CM | POA: Diagnosis not present

## 2023-07-19 DIAGNOSIS — G5600 Carpal tunnel syndrome, unspecified upper limb: Secondary | ICD-10-CM | POA: Diagnosis not present

## 2023-07-19 DIAGNOSIS — M1990 Unspecified osteoarthritis, unspecified site: Secondary | ICD-10-CM | POA: Diagnosis not present

## 2023-07-19 DIAGNOSIS — Z9181 History of falling: Secondary | ICD-10-CM | POA: Diagnosis not present

## 2023-07-19 DIAGNOSIS — D649 Anemia, unspecified: Secondary | ICD-10-CM | POA: Diagnosis not present

## 2023-07-19 DIAGNOSIS — Z96653 Presence of artificial knee joint, bilateral: Secondary | ICD-10-CM | POA: Diagnosis not present

## 2023-07-19 DIAGNOSIS — S72451D Displaced supracondylar fracture without intracondylar extension of lower end of right femur, subsequent encounter for closed fracture with routine healing: Secondary | ICD-10-CM | POA: Diagnosis not present

## 2023-07-21 DIAGNOSIS — K5900 Constipation, unspecified: Secondary | ICD-10-CM | POA: Diagnosis not present

## 2023-07-21 DIAGNOSIS — G5600 Carpal tunnel syndrome, unspecified upper limb: Secondary | ICD-10-CM | POA: Diagnosis not present

## 2023-07-21 DIAGNOSIS — D649 Anemia, unspecified: Secondary | ICD-10-CM | POA: Diagnosis not present

## 2023-07-21 DIAGNOSIS — Z96653 Presence of artificial knee joint, bilateral: Secondary | ICD-10-CM | POA: Diagnosis not present

## 2023-07-21 DIAGNOSIS — M1990 Unspecified osteoarthritis, unspecified site: Secondary | ICD-10-CM | POA: Diagnosis not present

## 2023-07-21 DIAGNOSIS — Z556 Problems related to health literacy: Secondary | ICD-10-CM | POA: Diagnosis not present

## 2023-07-21 DIAGNOSIS — Z9181 History of falling: Secondary | ICD-10-CM | POA: Diagnosis not present

## 2023-07-21 DIAGNOSIS — S72451D Displaced supracondylar fracture without intracondylar extension of lower end of right femur, subsequent encounter for closed fracture with routine healing: Secondary | ICD-10-CM | POA: Diagnosis not present

## 2023-07-21 DIAGNOSIS — I10 Essential (primary) hypertension: Secondary | ICD-10-CM | POA: Diagnosis not present

## 2023-07-26 DIAGNOSIS — Z556 Problems related to health literacy: Secondary | ICD-10-CM | POA: Diagnosis not present

## 2023-07-26 DIAGNOSIS — D649 Anemia, unspecified: Secondary | ICD-10-CM | POA: Diagnosis not present

## 2023-07-26 DIAGNOSIS — I10 Essential (primary) hypertension: Secondary | ICD-10-CM | POA: Diagnosis not present

## 2023-07-26 DIAGNOSIS — K5900 Constipation, unspecified: Secondary | ICD-10-CM | POA: Diagnosis not present

## 2023-07-26 DIAGNOSIS — S72451D Displaced supracondylar fracture without intracondylar extension of lower end of right femur, subsequent encounter for closed fracture with routine healing: Secondary | ICD-10-CM | POA: Diagnosis not present

## 2023-07-26 DIAGNOSIS — M1990 Unspecified osteoarthritis, unspecified site: Secondary | ICD-10-CM | POA: Diagnosis not present

## 2023-07-26 DIAGNOSIS — Z9181 History of falling: Secondary | ICD-10-CM | POA: Diagnosis not present

## 2023-07-26 DIAGNOSIS — G5600 Carpal tunnel syndrome, unspecified upper limb: Secondary | ICD-10-CM | POA: Diagnosis not present

## 2023-07-26 DIAGNOSIS — Z96653 Presence of artificial knee joint, bilateral: Secondary | ICD-10-CM | POA: Diagnosis not present

## 2023-07-28 DIAGNOSIS — M79604 Pain in right leg: Secondary | ICD-10-CM | POA: Diagnosis not present

## 2023-07-28 DIAGNOSIS — M25551 Pain in right hip: Secondary | ICD-10-CM | POA: Diagnosis not present

## 2023-08-03 DIAGNOSIS — S72451D Displaced supracondylar fracture without intracondylar extension of lower end of right femur, subsequent encounter for closed fracture with routine healing: Secondary | ICD-10-CM | POA: Diagnosis not present

## 2023-08-03 DIAGNOSIS — G5600 Carpal tunnel syndrome, unspecified upper limb: Secondary | ICD-10-CM | POA: Diagnosis not present

## 2023-08-03 DIAGNOSIS — K5900 Constipation, unspecified: Secondary | ICD-10-CM | POA: Diagnosis not present

## 2023-08-03 DIAGNOSIS — M1990 Unspecified osteoarthritis, unspecified site: Secondary | ICD-10-CM | POA: Diagnosis not present

## 2023-08-03 DIAGNOSIS — Z96653 Presence of artificial knee joint, bilateral: Secondary | ICD-10-CM | POA: Diagnosis not present

## 2023-08-03 DIAGNOSIS — Z9181 History of falling: Secondary | ICD-10-CM | POA: Diagnosis not present

## 2023-08-03 DIAGNOSIS — D649 Anemia, unspecified: Secondary | ICD-10-CM | POA: Diagnosis not present

## 2023-08-03 DIAGNOSIS — I10 Essential (primary) hypertension: Secondary | ICD-10-CM | POA: Diagnosis not present

## 2023-08-03 DIAGNOSIS — Z556 Problems related to health literacy: Secondary | ICD-10-CM | POA: Diagnosis not present

## 2023-08-05 DIAGNOSIS — I10 Essential (primary) hypertension: Secondary | ICD-10-CM | POA: Diagnosis not present

## 2023-08-05 DIAGNOSIS — Z9181 History of falling: Secondary | ICD-10-CM | POA: Diagnosis not present

## 2023-08-05 DIAGNOSIS — M1990 Unspecified osteoarthritis, unspecified site: Secondary | ICD-10-CM | POA: Diagnosis not present

## 2023-08-05 DIAGNOSIS — Z556 Problems related to health literacy: Secondary | ICD-10-CM | POA: Diagnosis not present

## 2023-08-05 DIAGNOSIS — G5600 Carpal tunnel syndrome, unspecified upper limb: Secondary | ICD-10-CM | POA: Diagnosis not present

## 2023-08-05 DIAGNOSIS — K5900 Constipation, unspecified: Secondary | ICD-10-CM | POA: Diagnosis not present

## 2023-08-05 DIAGNOSIS — Z96653 Presence of artificial knee joint, bilateral: Secondary | ICD-10-CM | POA: Diagnosis not present

## 2023-08-05 DIAGNOSIS — D649 Anemia, unspecified: Secondary | ICD-10-CM | POA: Diagnosis not present

## 2023-08-05 DIAGNOSIS — S72451D Displaced supracondylar fracture without intracondylar extension of lower end of right femur, subsequent encounter for closed fracture with routine healing: Secondary | ICD-10-CM | POA: Diagnosis not present

## 2023-08-09 DIAGNOSIS — S72451D Displaced supracondylar fracture without intracondylar extension of lower end of right femur, subsequent encounter for closed fracture with routine healing: Secondary | ICD-10-CM | POA: Diagnosis not present

## 2023-08-09 DIAGNOSIS — Z96653 Presence of artificial knee joint, bilateral: Secondary | ICD-10-CM | POA: Diagnosis not present

## 2023-08-09 DIAGNOSIS — D649 Anemia, unspecified: Secondary | ICD-10-CM | POA: Diagnosis not present

## 2023-08-09 DIAGNOSIS — G5600 Carpal tunnel syndrome, unspecified upper limb: Secondary | ICD-10-CM | POA: Diagnosis not present

## 2023-08-09 DIAGNOSIS — I10 Essential (primary) hypertension: Secondary | ICD-10-CM | POA: Diagnosis not present

## 2023-08-09 DIAGNOSIS — Z556 Problems related to health literacy: Secondary | ICD-10-CM | POA: Diagnosis not present

## 2023-08-09 DIAGNOSIS — M1990 Unspecified osteoarthritis, unspecified site: Secondary | ICD-10-CM | POA: Diagnosis not present

## 2023-08-09 DIAGNOSIS — Z9181 History of falling: Secondary | ICD-10-CM | POA: Diagnosis not present

## 2023-08-09 DIAGNOSIS — K5900 Constipation, unspecified: Secondary | ICD-10-CM | POA: Diagnosis not present

## 2023-08-17 DIAGNOSIS — Z556 Problems related to health literacy: Secondary | ICD-10-CM | POA: Diagnosis not present

## 2023-08-17 DIAGNOSIS — K5900 Constipation, unspecified: Secondary | ICD-10-CM | POA: Diagnosis not present

## 2023-08-17 DIAGNOSIS — S72451D Displaced supracondylar fracture without intracondylar extension of lower end of right femur, subsequent encounter for closed fracture with routine healing: Secondary | ICD-10-CM | POA: Diagnosis not present

## 2023-08-17 DIAGNOSIS — G5600 Carpal tunnel syndrome, unspecified upper limb: Secondary | ICD-10-CM | POA: Diagnosis not present

## 2023-08-17 DIAGNOSIS — I10 Essential (primary) hypertension: Secondary | ICD-10-CM | POA: Diagnosis not present

## 2023-08-17 DIAGNOSIS — D649 Anemia, unspecified: Secondary | ICD-10-CM | POA: Diagnosis not present

## 2023-08-17 DIAGNOSIS — Z9181 History of falling: Secondary | ICD-10-CM | POA: Diagnosis not present

## 2023-08-17 DIAGNOSIS — M1990 Unspecified osteoarthritis, unspecified site: Secondary | ICD-10-CM | POA: Diagnosis not present

## 2023-08-17 DIAGNOSIS — Z96653 Presence of artificial knee joint, bilateral: Secondary | ICD-10-CM | POA: Diagnosis not present

## 2023-08-24 DIAGNOSIS — G5600 Carpal tunnel syndrome, unspecified upper limb: Secondary | ICD-10-CM | POA: Diagnosis not present

## 2023-08-24 DIAGNOSIS — Z556 Problems related to health literacy: Secondary | ICD-10-CM | POA: Diagnosis not present

## 2023-08-24 DIAGNOSIS — D649 Anemia, unspecified: Secondary | ICD-10-CM | POA: Diagnosis not present

## 2023-08-24 DIAGNOSIS — I10 Essential (primary) hypertension: Secondary | ICD-10-CM | POA: Diagnosis not present

## 2023-08-24 DIAGNOSIS — Z9181 History of falling: Secondary | ICD-10-CM | POA: Diagnosis not present

## 2023-08-24 DIAGNOSIS — M1990 Unspecified osteoarthritis, unspecified site: Secondary | ICD-10-CM | POA: Diagnosis not present

## 2023-08-24 DIAGNOSIS — S72451D Displaced supracondylar fracture without intracondylar extension of lower end of right femur, subsequent encounter for closed fracture with routine healing: Secondary | ICD-10-CM | POA: Diagnosis not present

## 2023-08-24 DIAGNOSIS — K5900 Constipation, unspecified: Secondary | ICD-10-CM | POA: Diagnosis not present

## 2023-08-24 DIAGNOSIS — Z96653 Presence of artificial knee joint, bilateral: Secondary | ICD-10-CM | POA: Diagnosis not present

## 2023-08-30 DIAGNOSIS — K5900 Constipation, unspecified: Secondary | ICD-10-CM | POA: Diagnosis not present

## 2023-08-30 DIAGNOSIS — M1990 Unspecified osteoarthritis, unspecified site: Secondary | ICD-10-CM | POA: Diagnosis not present

## 2023-08-30 DIAGNOSIS — D649 Anemia, unspecified: Secondary | ICD-10-CM | POA: Diagnosis not present

## 2023-08-30 DIAGNOSIS — Z96653 Presence of artificial knee joint, bilateral: Secondary | ICD-10-CM | POA: Diagnosis not present

## 2023-08-30 DIAGNOSIS — Z9181 History of falling: Secondary | ICD-10-CM | POA: Diagnosis not present

## 2023-08-30 DIAGNOSIS — G5600 Carpal tunnel syndrome, unspecified upper limb: Secondary | ICD-10-CM | POA: Diagnosis not present

## 2023-08-30 DIAGNOSIS — S72451D Displaced supracondylar fracture without intracondylar extension of lower end of right femur, subsequent encounter for closed fracture with routine healing: Secondary | ICD-10-CM | POA: Diagnosis not present

## 2023-08-30 DIAGNOSIS — Z556 Problems related to health literacy: Secondary | ICD-10-CM | POA: Diagnosis not present

## 2023-08-30 DIAGNOSIS — I10 Essential (primary) hypertension: Secondary | ICD-10-CM | POA: Diagnosis not present

## 2023-09-06 ENCOUNTER — Encounter: Payer: Self-pay | Admitting: Family

## 2023-09-06 ENCOUNTER — Ambulatory Visit (INDEPENDENT_AMBULATORY_CARE_PROVIDER_SITE_OTHER): Payer: Medicare PPO | Admitting: Family

## 2023-09-06 VITALS — BP 110/70 | HR 67 | Temp 97.7°F | Resp 14 | Ht 64.0 in | Wt 142.6 lb

## 2023-09-06 DIAGNOSIS — Z23 Encounter for immunization: Secondary | ICD-10-CM | POA: Diagnosis not present

## 2023-09-06 DIAGNOSIS — Z0000 Encounter for general adult medical examination without abnormal findings: Secondary | ICD-10-CM

## 2023-09-06 NOTE — Progress Notes (Signed)
Subjective:   Tracy Faulkner is a 89 y.o. female who presents for Medicare Annual (Subsequent) preventive examination.  Visit Complete: In person  Patient Medicare AWV questionnaire was completed by the patient on 09/06/2023; I have confirmed that all information answered by patient is correct and no changes since this date.  Cardiac Risk Factors include: advanced age (>39men, >22 women);sedentary lifestyle;hypertension     Objective:    Today's Vitals   09/06/23 1609  BP: 110/70  Pulse: 67  Resp: 14  Temp: 97.7 F (36.5 C)  SpO2: 90%  Weight: 142 lb 9.6 oz (64.7 kg)  Height: 5\' 4"  (1.626 m)   Body mass index is 24.48 kg/m.     09/06/2023    4:08 PM 06/20/2023    3:39 PM 06/11/2023    7:31 PM 06/08/2023    3:59 PM 03/27/2023   12:34 PM 12/13/2022    7:51 AM 12/11/2022    7:51 PM  Advanced Directives  Does Patient Have a Medical Advance Directive? No No No No Unable to assess, patient is non-responsive or altered mental status Yes No  Type of Dispensing optician of [place];Living will Healthcare Power of [place];Living will  Does patient want to make changes to medical advance directive?       No - Patient declined  Copy of Healthcare Power of Attorney in Chart?      No - copy requested No - copy available, Physician notified  Would patient like information on creating a medical advance directive?  No - Patient declined No - Patient declined Yes (MAU/Ambulatory/Procedural Areas - Information given)   No - Patient declined    Current Medications (verified) Outpatient Encounter Medications as of 09/06/2023  Medication Sig   ACETAMINOPHEN ER PO Take 500 mg by mouth daily as needed for pain.   bisacodyl (DULCOLAX) 5 MG EC tablet Take 1 tablet (5 mg total) by mouth daily as needed for moderate constipation.   CALCIUM PO Take 1 capsule by mouth daily.   cycloSPORINE (RESTASIS) 0.05 % ophthalmic emulsion Place 1 drop into both eyes daily.   docusate sodium  (COLACE) 100 MG capsule Take 1 capsule (100 mg total) by mouth 2 (two) times daily.   Iron, Ferrous Sulfate, 325 (65 Fe) MG TABS Take 325 mg by mouth daily. D64.9   losartan (COZAAR) 50 MG tablet TAKE 1 TABLET BY MOUTH EVERY DAY   Multiple Vitamin (MULTIVITAMIN) tablet Take 1 tablet by mouth daily.   Omega-3 Fatty Acids (FISH OIL PO) Take 1 capsule by mouth daily.   OVER THE COUNTER MEDICATION Take 1 capsule by mouth daily. Gluco Gel Joint & Cartilage Support   OVER THE COUNTER MEDICATION Apply 1 application  topically daily as needed (pain relief). Youngevity Pain Cream   polyethylene glycol (MIRALAX / GLYCOLAX) 17 g packet Take 17 g by mouth daily as needed for mild constipation.   No facility-administered encounter medications on file as of 09/06/2023.    Allergies (verified) Ace inhibitors, Lortab [hydrocodone-acetaminophen], Remeron [mirtazapine], and Penicillins   History: Past Medical History:  Diagnosis Date   Anemia    Arthritis    Ataxia    Back problem    Bowel trouble    Breast cancer (HCC)    Carpal tunnel syndrome    Constipation    Disturbance, sleep    Dyspnea    increased exertion    Frequent headaches    GI bleed    History of  mammogram 2000   History of urinary tract infection    Hypertension    Obesity    Pain in both knees    Sepsis (HCC)    Urinary incontinence    Past Surgical History:  Procedure Laterality Date   BREAST SURGERY     for cancer-right breast   COLON SURGERY     secondary to bowel blockages   EYE SURGERY     cataract surgery bilateral    gastric stapling     KNEE ARTHROPLASTY Left 10/07/2016   Procedure: LEFT TOTAL KNEE ARTHROPLASTY WITH COMPUTER NAVIGATION;  Surgeon: Samson Frederic, MD;  Location: [facility];  Service: Orthopedics;  Laterality: Left;   ORIF FEMUR FRACTURE Right 12/13/2022   Procedure: OPEN REDUCTION INTERNAL FIXATION (ORIF) DISTAL FEMUR FRACTURE;  Surgeon: Roby Lofts, MD;  Location: [facility];  Service: Orthopedics;   Laterality: Right;   Family History  Problem Relation Age of Onset   Arthritis Mother    Diabetes Mellitus II Mother    Hypertension Mother    Breast cancer Mother    Heart disease Father    Social History   Socioeconomic History   Marital status: Widowed    Spouse name: Not on file   Number of children: 3   Years of education: 8   Highest education level: Not on file  Occupational History   Occupation: unemployed  Tobacco Use   Smoking status: Never   Smokeless tobacco: Never  Vaping Use   Vaping status: Never Used  Substance and Sexual Activity   Alcohol use: No    Alcohol/week: 0.0 standard drinks of alcohol   Drug use: No   Sexual activity: Not Currently  Other Topics Concern   Not on file  Social History Narrative   Tobacco use, amount per day now: N/A   Past tobacco use, amount per day:   How many years did you use tobacco:   Alcohol use (drinks per week): N/A   Diet: Regular, Limited salt.   Do you drink/eat things with caffeine: Yes.   Marital status:  Widowed                                What year were you married?   Do you live in a house, apartment, assisted living, condo, trailer, etc.? House.   Is it one or more stories? Yes.   How many persons live in your home? 4   Do you have pets in your home?( please list) No   Highest Level of education completed? 8th grade.   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   Do you exercise? Some                                 Type and how often?   Do you have a living will? No   Do you have a DNR form? No                                  If not, do you want to discuss one?   Do you have signed POA/HPOA forms? No                       If so, please bring to you appointment  Do you have any difficulty bathing or dressing yourself? No, minimal assistance.   Do you have any difficulty preparing food or eating? No, limited preparation.    Do you have any difficulty managing your medications?  Yes, managed by family   Do you have any difficulty managing your finances? Yes, managed by family.   Do you have any difficulty affording your medications? No.   Social Determinants of Health   Financial Resource Strain: Not on file  Food Insecurity: No Food Insecurity (12/12/2022)   Hunger Vital Sign    Worried About Running Out of Food in the Last Year: Never true    Ran Out of Food in the Last Year: Never true  Transportation Needs: No Transportation Needs (12/12/2022)   PRAPARE - Administrator, Civil Service (Medical): No    Lack of Transportation (Non-Medical): No  Physical Activity: Not on file  Stress: Not on file  Social Connections: Not on file    Tobacco Counseling Counseling given: Not Answered   Clinical Intake:  Pre-visit preparation completed: No  Pain : 0-10 Pain Score:  (10 when it's really bad) Pain Type: Chronic pain Pain Location: Back Pain Orientation: Lower Pain Radiating Towards: NO Pain Descriptors / Indicators: Aching Pain Onset: More than a month ago Pain Frequency: Intermittent Pain Relieving Factors: Tylenol one a day Effect of Pain on Daily Activities: No  Pain Relieving Factors: Tylenol one a day  BMI - recorded: 24.48  How often do you need to have someone help you when you read instructions, pamphlets, or other written materials from your doctor or pharmacy?: 5 - Always What is the last grade level you completed in school?: 10 grade  Interpreter Needed?: No      Activities of Daily Living    09/06/2023    4:34 PM 12/12/2022    5:10 AM  In your present state of health, do you have any difficulty performing the following activities:  Hearing? 0   Vision? 0   Walking or climbing stairs? 1   Comment uses a walker   Dressing or bathing? 1   Doing errands, shopping? 1 0  Comment daughter Advice worker and eating ? Y   Comment daughter assist   Using the Toilet? N   In the past six months, have you  accidently leaked urine? Y   Do you have problems with loss of bowel control? N   Managing your Medications? Y   Comment daughter assist   Managing your Finances? Y   Comment daughter assist   Housekeeping or managing your Housekeeping? Y   Comment daughter assist     Patient Care Team: Madine Sarr, Donalee Citrin, NP as PCP - General (Family Medicine) Lyn Records, MD (Inactive) as PCP - Cardiology (Cardiology) Lyn Records, MD (Inactive) as Consulting Physician (Cardiology) Rowyn Mustapha, Donalee Citrin, NP (Family Medicine)  Indicate any recent Medical Services you may have received from other than [facility] providers in the past year (date may be approximate).     Assessment:   This is a routine wellness examination for [facility].  Hearing/Vision screen No results found.   Goals Addressed             This Visit's Progress    Patient Stated       Drink more water at least 6-8 glasses        Depression Screen    09/06/2023    4:07 PM 02/24/2023   10:28 AM 09/02/2022  3:59 PM 08/12/2021    8:38 AM 11/15/2016    2:16 PM  PHQ 2/9 Scores  PHQ - 2 Score 0 0 0 0 0    Fall Risk    09/06/2023    4:07 PM 06/20/2023    3:39 PM 05/23/2023    3:52 PM 02/24/2023   10:28 AM 01/05/2023    3:23 PM  Fall Risk   Falls in the past year? 1 1 1  0 1  Number falls in past yr: 0 1 1 0 1  Injury with Fall? 1 1 1  0 1  Risk for fall due to :  History of fall(s);Impaired balance/gait;Impaired mobility History of fall(s);Impaired balance/gait;Impaired mobility History of fall(s);Impaired balance/gait;Impaired mobility History of fall(s);Impaired balance/gait;Impaired mobility  Follow up  Falls evaluation completed   Falls evaluation completed    MEDICARE RISK AT HOME: Medicare Risk at Home Any stairs in or around the home?: Yes If so, are there any without handrails?: No Home free of loose throw rugs in walkways, pet beds, electrical cords, etc?: No Adequate lighting in your home to reduce risk of falls?:  No Life alert?: Yes Use of a cane, walker or w/c?: Yes (walker) Grab bars in the bathroom?: No Shower chair or bench in shower?: Yes Elevated toilet seat or a handicapped toilet?: Yes  TIMED UP AND GO:  Was the test performed?  Yes  Length of time to ambulate 10 feet: 25 sec Gait slow and steady with assistive device    Cognitive Function:    09/06/2023    4:11 PM  MMSE - Mini Mental State Exam  Orientation to time 0  Orientation to Place 2  Registration 2  Attention/ Calculation 0  Recall 2  Language- name 2 objects 2  Language- repeat 1  Language- follow 3 step command 3  Language- read & follow direction 1  Write a sentence 1  Copy design 0  Total score 14        09/02/2022    4:01 PM 08/12/2021    8:42 AM  6CIT Screen  What Year? 4 points 4 points  What month? 3 points 3 points  What time? 3 points 0 points  Count back from 20 0 points 0 points  Months in reverse 4 points 0 points  Repeat phrase 10 points 8 points  Total Score 24 points 15 points    Immunizations Immunization History  Administered Date(s) Administered   Fluad Quad(high Dose 65+) 09/07/2021, 09/24/2022   Fluad Trivalent(High Dose 65+) 09/06/2023   Influenza, High Dose Seasonal PF 08/13/2019   Influenza,inj,quad, With Preservative 09/15/2017   Janssen (J&J) SARS-COV-2 Vaccination 02/03/2020   Pneumococcal Conjugate-13 08/18/2015   Pneumococcal Polysaccharide-23 12/13/2017   Tdap 10/11/2017   Zoster Recombinant(Shingrix) 10/11/2017, 12/13/2017   Zoster, Live 10/12/2017    TDAP status: Up to date  Flu Vaccine status: Completed at today's visit  Pneumococcal vaccine status: Up to date  Covid-19 vaccine status: Declined, Education has been provided regarding the importance of this vaccine but patient still declined. Advised may receive this vaccine at local pharmacy or Health Dept.or vaccine clinic. Aware to provide a copy of the vaccination record if obtained from local pharmacy or  Health Dept. Verbalized acceptance and understanding.  Qualifies for Shingles Vaccine? Yes   Zostavax completed No   Shingrix Completed?: Yes  Screening Tests Health Maintenance  Topic Date Due   Medicare Annual Wellness (AWV)  09/05/2024   DTaP/Tdap/Td (2 - Td or Tdap) 10/12/2027   Pneumonia  Vaccine 37+ Years old  Completed   INFLUENZA VACCINE  Completed   DEXA SCAN  Completed   Zoster Vaccines- Shingrix  Completed   HPV VACCINES  Aged Out   COVID-19 Vaccine  Discontinued    Health Maintenance  There are no preventive care reminders to display for this patient.   Colorectal cancer screening: No longer required.   Mammogram status: No longer required due to advance age .  Bone Density status: Completed 09/01/2022. Results reflect: Bone density results: OSTEOPOROSIS. Repeat every 2 years.  Lung Cancer Screening: (Low Dose CT Chest recommended if Age 41-80 years, 20 pack-year currently smoking OR have quit w/in 15years.) does not qualify.   Lung Cancer Screening Referral:N/A   Additional Screening:  Hepatitis C Screening: does not qualify; Completed No  Vision Screening: Recommended annual ophthalmology exams for early detection of glaucoma and other disorders of the eye. Is the patient up to date with their annual eye exam?  Yes  Who is the provider or what is the name of the office in which the patient attends annual eye exams? Dr.Billing  If pt is not established with a provider, would they like to be referred to a provider to establish care? No .   Dental Screening: Recommended annual dental exams for proper oral hygiene  Diabetic Foot Exam: Diabetic Foot Exam: Completed N/A   Community Resource Referral / Chronic Care Management: CRR required this visit?  No   CCM required this visit?  No     Plan:     I have personally reviewed and noted the following in the patient's chart:   Medical and social history Use of alcohol, tobacco or illicit drugs  Current  medications and supplements including opioid prescriptions. Patient is not currently taking opioid prescriptions. Functional ability and status Nutritional status Physical activity Advanced directives List of other physicians Hospitalizations, surgeries, and ER visits in previous 12 months Vitals Screenings to include cognitive, depression, and falls Referrals and appointments  In addition, I have reviewed and discussed with patient certain preventive protocols, quality metrics, and best practice recommendations. A written personalized care plan for preventive services as well as general preventive health recommendations were provided to patient.     Caesar Bookman, NP   09/06/2023   After Visit Summary: (In Person-Declined) Patient declined AVS at this time.  Nurse Notes: Up to date

## 2023-11-15 ENCOUNTER — Ambulatory Visit (INDEPENDENT_AMBULATORY_CARE_PROVIDER_SITE_OTHER): Payer: Medicare PPO | Admitting: Family

## 2023-11-15 ENCOUNTER — Encounter: Payer: Self-pay | Admitting: Family

## 2023-11-15 VITALS — BP 108/66 | Resp 16 | Ht 64.0 in | Wt 147.0 lb

## 2023-11-15 DIAGNOSIS — R350 Frequency of micturition: Secondary | ICD-10-CM | POA: Diagnosis not present

## 2023-11-15 LAB — POCT URINALYSIS DIPSTICK
Bilirubin, UA: NEGATIVE
Blood, UA: NEGATIVE
Glucose, UA: NEGATIVE
Ketones, UA: NEGATIVE
Nitrite, UA: NEGATIVE
Protein, UA: NEGATIVE
Spec Grav, UA: 1.02 (ref 1.010–1.025)
Urobilinogen, UA: 0.2 U/dL — NL
pH, UA: 6 (ref 5.0–8.0)

## 2023-11-15 NOTE — Progress Notes (Unsigned)
Provider: Richarda Blade FNP-C  Jona Erkkila, Donalee Citrin, NP  Patient Care Team: Geoffrey Mankin, Donalee Citrin, NP as PCP - General (Family Medicine) Lyn Records, MD (Inactive) as PCP - Cardiology (Cardiology) Lyn Records, MD (Inactive) as Consulting Physician (Cardiology) Jiana Lemaire, Donalee Citrin, NP (Family Medicine)  Extended Emergency Contact Information Primary Emergency Contact: Tomasa Hose [place] Home Phone: [phone] Mobile Phone: [phone] Relation: Daughter Secondary Emergency Contact: [facility] Phone: [phone] Relation: Daughter  Code Status:  Full Code  Goals of care: Advanced Directive information    11/15/2023   10:19 AM  Advanced Directives  Does Patient Have a Medical Advance Directive? No  Would patient like information on creating a medical advance directive? No - Patient declined     Chief Complaint  Patient presents with   Acute Visit    Possible UTI    HPI:  Pt is a 89 y.o. female seen today for an acute visit for evaluation for possible UTI.she is here with her daughter who provides HPI information.States patient ha been more confused.Has had urine frequency and strong urine odor. She denies any fever,chills,nausea,vomiting,abdominal pain,flank pain,urgency,dysuria,difficult urination or hematuria.  Past Medical History:  Diagnosis Date   Anemia    Arthritis    Ataxia    Back problem    Bowel trouble    Breast cancer (HCC)    Carpal tunnel syndrome    Constipation    Disturbance, sleep    Dyspnea    increased exertion    Frequent headaches    GI bleed    History of mammogram 2000   History of urinary tract infection    Hypertension    Obesity    Pain in both knees    Sepsis (HCC)    Urinary incontinence    Past Surgical History:  Procedure Laterality Date   BREAST SURGERY     for cancer-right breast   COLON SURGERY     secondary to bowel blockages   EYE SURGERY     cataract surgery bilateral     gastric stapling     KNEE ARTHROPLASTY Left 10/07/2016   Procedure: LEFT TOTAL KNEE ARTHROPLASTY WITH COMPUTER NAVIGATION;  Surgeon: Samson Frederic, MD;  Location: [facility];  Service: Orthopedics;  Laterality: Left;   ORIF FEMUR FRACTURE Right 12/13/2022   Procedure: OPEN REDUCTION INTERNAL FIXATION (ORIF) DISTAL FEMUR FRACTURE;  Surgeon: Roby Lofts, MD;  Location: [facility];  Service: Orthopedics;  Laterality: Right;    Allergies  Allergen Reactions   Ace Inhibitors Cough   Lortab [Hydrocodone-Acetaminophen] Other (See Comments)    Bad dreams   Remeron [Mirtazapine] Other (See Comments)    Hallucinations (can take 1/2 tablet--7.5 mg), not 15 mg   Penicillins Rash and Other (See Comments)    Throat swelling, Has patient had a PCN reaction causing immediate rash, facial/tongue/throat swelling, SOB or lightheadedness with hypotension: Yes Has patient had a PCN reaction causing severe rash involving mucus membranes or skin necrosis: No Has patient had a PCN reaction that required hospitalization No Has patient had a PCN reaction occurring within the last 10 years: No If all of the above answers are "NO", then may proceed with Cephalosporin use.     Outpatient Encounter Medications as of 11/15/2023  Medication Sig   ACETAMINOPHEN ER PO Take 500 mg by mouth daily as needed for pain.   cycloSPORINE (RESTASIS) 0.05 % ophthalmic emulsion Place 1 drop into both eyes daily.   losartan (COZAAR) 50 MG tablet  TAKE 1 TABLET BY MOUTH EVERY DAY   Multiple Vitamin (MULTIVITAMIN) tablet Take 1 tablet by mouth daily.   Omega-3 Fatty Acids (FISH OIL PO) Take 1 capsule by mouth daily.   OVER THE COUNTER MEDICATION Take 1 capsule by mouth daily. Gluco Gel Joint & Cartilage Support   OVER THE COUNTER MEDICATION Apply 1 application  topically daily as needed (pain relief). Youngevity Pain Cream   bisacodyl (DULCOLAX) 5 MG EC tablet Take 1 tablet (5 mg total) by mouth daily as needed for moderate constipation.  (Patient not taking: Reported on 11/15/2023)   CALCIUM PO Take 1 capsule by mouth daily. (Patient not taking: Reported on 11/15/2023)   docusate sodium (COLACE) 100 MG capsule Take 1 capsule (100 mg total) by mouth 2 (two) times daily. (Patient not taking: Reported on 11/15/2023)   Iron, Ferrous Sulfate, 325 (65 Fe) MG TABS Take 325 mg by mouth daily. D64.9 (Patient not taking: Reported on 11/15/2023)   polyethylene glycol (MIRALAX / GLYCOLAX) 17 g packet Take 17 g by mouth daily as needed for mild constipation. (Patient not taking: Reported on 11/15/2023)   No facility-administered encounter medications on file as of 11/15/2023.    Review of Systems  Constitutional:  Negative for appetite change, chills, fatigue, fever and unexpected weight change.  Respiratory:  Negative for cough, chest tightness, shortness of breath and wheezing.   Cardiovascular:  Negative for chest pain, palpitations and leg swelling.  Gastrointestinal:  Negative for abdominal distention, abdominal pain, nausea and vomiting.  Genitourinary:  Positive for frequency. Negative for difficulty urinating, dysuria, flank pain and urgency.       Strong urine odor   Musculoskeletal:  Positive for gait problem. Negative for arthralgias and back pain.  Psychiatric/Behavioral:  Positive for confusion. Negative for agitation, behavioral problems, hallucinations and sleep disturbance. The patient is not nervous/anxious.     Immunization History  Administered Date(s) Administered   Fluad Quad(high Dose 65+) 09/07/2021, 09/24/2022   Fluad Trivalent(High Dose 65+) 09/06/2023   Influenza, High Dose Seasonal PF 08/13/2019   Influenza,inj,quad, With Preservative 09/15/2017   Janssen (J&J) SARS-COV-2 Vaccination 02/03/2020   Pneumococcal Conjugate-13 08/18/2015   Pneumococcal Polysaccharide-23 12/13/2017   Tdap 10/11/2017   Zoster Recombinant(Shingrix) 10/11/2017, 12/13/2017   Zoster, Live 10/12/2017   Pertinent  Health Maintenance  Due  Topic Date Due   INFLUENZA VACCINE  Completed   DEXA SCAN  Completed      02/24/2023   10:28 AM 05/23/2023    3:52 PM 06/20/2023    3:39 PM 09/06/2023    4:07 PM 11/15/2023   10:17 AM  Fall Risk  Falls in the past year? 0 1 1 1  0  Was there an injury with Fall? 0 1 1 1  0  Fall Risk Category Calculator 0 3 3 2  0  Patient at Risk for Falls Due to History of fall(s);Impaired balance/gait;Impaired mobility History of fall(s);Impaired balance/gait;Impaired mobility History of fall(s);Impaired balance/gait;Impaired mobility    Fall risk Follow up   Falls evaluation completed     Functional Status Survey:    Vitals:   11/15/23 1022  BP: 108/66  Resp: 16  Weight: 147 lb (66.7 kg)  Height: 5\' 4"  (1.626 m)   Body mass index is 25.23 kg/m. Physical Exam Vitals reviewed.  Constitutional:      General: She is not in acute distress.    Appearance: Normal appearance. She is overweight. She is not ill-appearing or diaphoretic.  HENT:     Head: Normocephalic.  Mouth/Throat:     Mouth: Mucous membranes are moist.     Pharynx: Oropharynx is clear. No oropharyngeal exudate.  Neck:     Vascular: No carotid bruit.  Cardiovascular:     Rate and Rhythm: Normal rate and regular rhythm.     Pulses: Normal pulses.     Heart sounds: Normal heart sounds. No murmur heard.    No friction rub. No gallop.  Pulmonary:     Effort: Pulmonary effort is normal. No respiratory distress.     Breath sounds: Normal breath sounds. No wheezing, rhonchi or rales.  Chest:     Chest wall: No tenderness.  Abdominal:     General: Bowel sounds are normal. There is no distension.     Palpations: Abdomen is soft. There is no mass.     Tenderness: There is no abdominal tenderness. There is no right CVA tenderness, left CVA tenderness, guarding or rebound.  Musculoskeletal:     Cervical back: Normal range of motion. No rigidity or tenderness.  Lymphadenopathy:     Cervical: No cervical adenopathy.   Neurological:     Mental Status: She is alert and oriented to person, place, and time.     Motor: No weakness.     Gait: Gait abnormal.  Psychiatric:        Mood and Affect: Mood normal.        Speech: Speech normal.        Behavior: Behavior normal.     Labs reviewed: Recent Labs    12/13/22 0011 12/14/22 0657 12/15/22 0653 01/05/23 1552 03/27/23 1347 06/11/23 2118  NA 147* 138 139 140 140 140  K 4.3 4.1 3.7 4.4 3.8 4.1  CL 113* 107 111 108 108 109  CO2 23 24 24 22 23 23   GLUCOSE 66* 93 81 88 88 104*  BUN 22 26* 23 21 18 17   CREATININE 0.96 1.17* 0.90 1.02* 0.95 1.09*  CALCIUM 9.5 8.2* 8.3* 9.7 9.3 9.1  MG 2.2 1.9 2.0  --   --   --    Recent Labs    12/12/22 0207 03/27/23 1347  AST 30 23  ALT 24 17  ALKPHOS 55 89  BILITOT 0.4 0.8  PROT 5.8* 6.2*  ALBUMIN 3.0* 3.2*   Recent Labs    01/05/23 1552 03/27/23 1036 06/11/23 2118 06/20/23 1641  WBC 5.0 4.3 4.7 5.2  NEUTROABS 3,300  --  3.2 2,985  HGB 9.5* 10.1* 10.3* 10.3*  HCT 30.5* 32.3* 33.1* 32.6*  MCV 85.7 80.1 83.8 81.7  PLT 245 149* 150 160   Lab Results  Component Value Date   TSH 0.304 (L) 12/12/2022   No results found for: "HGBA1C" Lab Results  Component Value Date   CHOL 195 03/08/2022   HDL 59 03/08/2022   LDLCALC 122 (H) 03/08/2022   TRIG 57 03/08/2022   CHOLHDL 3.3 03/08/2022    Significant Diagnostic Results in last 30 days:  No results found.  Assessment/Plan There are no diagnoses linked to this encounter.   Family/ staff Communication: Reviewed plan of care with patient  Labs/tests ordered: None   Next Appointment:   Caesar Bookman, NP

## 2023-11-17 LAB — URINE CULTURE
MICRO NUMBER:: 15862159
SPECIMEN QUALITY:: ADEQUATE

## 2023-11-21 ENCOUNTER — Other Ambulatory Visit: Payer: Self-pay

## 2023-11-21 MED ORDER — CIPROFLOXACIN HCL 500 MG PO TABS: 500.0000 mg | ORAL_TABLET | Freq: Two times a day (BID) | ORAL | 0 refills | Status: AC

## 2023-11-21 MED ORDER — SACCHAROMYCES BOULARDII 250 MG PO CAPS: 250.0000 mg | ORAL_CAPSULE | Freq: Two times a day (BID) | ORAL | 0 refills | Status: AC

## 2023-12-08 ENCOUNTER — Ambulatory Visit: Payer: Medicare Other | Admitting: Family

## 2023-12-23 ENCOUNTER — Encounter: Payer: Self-pay | Admitting: Family

## 2023-12-23 ENCOUNTER — Ambulatory Visit (INDEPENDENT_AMBULATORY_CARE_PROVIDER_SITE_OTHER): Payer: Medicare Other | Admitting: Family

## 2023-12-23 VITALS — BP 122/88 | HR 78 | Temp 96.8°F | Resp 18 | Ht 64.0 in | Wt 147.8 lb

## 2023-12-23 DIAGNOSIS — D509 Iron deficiency anemia, unspecified: Secondary | ICD-10-CM | POA: Diagnosis not present

## 2023-12-23 DIAGNOSIS — R32 Unspecified urinary incontinence: Secondary | ICD-10-CM

## 2023-12-23 DIAGNOSIS — N1831 Chronic kidney disease, stage 3a: Secondary | ICD-10-CM

## 2023-12-23 DIAGNOSIS — R2681 Unsteadiness on feet: Secondary | ICD-10-CM | POA: Diagnosis not present

## 2023-12-23 DIAGNOSIS — I10 Essential (primary) hypertension: Secondary | ICD-10-CM

## 2023-12-23 NOTE — Progress Notes (Signed)
Provider: Richarda Blade FNP-C   Merryl Buckels, Donalee Citrin, NP  Patient Care Team: Inis Borneman, Donalee Citrin, NP as PCP - General (Family Medicine) Lyn Records, MD (Inactive) as PCP - Cardiology (Cardiology) Lyn Records, MD (Inactive) as Consulting Physician (Cardiology) Viveka Wilmeth, Donalee Citrin, NP (Family Medicine)  Extended Emergency Contact Information Primary Emergency Contact: Tomasa Hose [place] Home Phone: [phone] Mobile Phone: [phone] Relation: Daughter Secondary Emergency Contact: [facility] Phone: [phone] Relation: Daughter  Code Status:  Full Code  Goals of care: Advanced Directive information    11/15/2023   10:19 AM  Advanced Directives  Does Patient Have a Medical Advance Directive? No  Would patient like information on creating a medical advance directive? No - Patient declined     Chief Complaint  Patient presents with   Medical Management of Chronic Issues     6 month follow-up   Discussed the use of AI scribe software for clinical note transcription with the patient, who gave verbal consent to proceed.  HPI:  Pt is a 89 y.o. female seen today for 6 months follow up for medical management of chronic diseases. The patient, with a history of hypertension, iron deficency Anemia,CKD ,urine incontinent. She reports no acute issues and her weight has remained stable. Her appetite is good and she has been eating regularly. She reports having bowel movements regularly with no issues of diarrhea or constipation. However, she has been experiencing urinary incontinence, which has been managed with pull-ups. She has been using toilet paper for cleaning and has not reported any infections.  The patient has been taking Losartan 50mg  for hypertension, Tylenol 500mg  as needed, Restasis eye drops, a multivitamin, Omega 3 fatty acid, and a bone supplement called Gluco gel. She also uses a pain relief cream as needed. She has not been using Miralax  as she has not had constipation in a while.  The patient's lab work from six months ago showed slightly low hemoglobin and slightly high creatinine, but these were stable. She has not noticed any blood in her stool. She has been experiencing some itching in her eyes, which has improved with the use of eye drops. She has a long-standing knot on her right hand, which is not painful but gets a little sore. She also experiences some pain in her knees, which is worse in cold weather.  The patient has had several falls, the most recent one occurring in her bedroom. She did not hit her head and was not dizzy at the time of the fall. She uses a walker for mobility. She has been working with a physical therapist and has been encouraged to continue exercises at home. She has been drinking water regularly and her blood pressure has been stable.      Past Medical History:  Diagnosis Date   Anemia    Arthritis    Ataxia    Back problem    Bowel trouble    Breast cancer (HCC)    Carpal tunnel syndrome    Constipation    Disturbance, sleep    Dyspnea    increased exertion    Frequent headaches    GI bleed    History of mammogram 2000   History of urinary tract infection    Hypertension    Obesity    Pain in both knees    Sepsis (HCC)    Urinary incontinence    Past Surgical History:  Procedure Laterality Date   BREAST SURGERY  for cancer-right breast   COLON SURGERY     secondary to bowel blockages   EYE SURGERY     cataract surgery bilateral    gastric stapling     KNEE ARTHROPLASTY Left 10/07/2016   Procedure: LEFT TOTAL KNEE ARTHROPLASTY WITH COMPUTER NAVIGATION;  Surgeon: Samson Frederic, MD;  Location: [facility];  Service: Orthopedics;  Laterality: Left;   ORIF FEMUR FRACTURE Right 12/13/2022   Procedure: OPEN REDUCTION INTERNAL FIXATION (ORIF) DISTAL FEMUR FRACTURE;  Surgeon: Roby Lofts, MD;  Location: [facility];  Service: Orthopedics;  Laterality: Right;    Allergies  Allergen  Reactions   Ace Inhibitors Cough   Lortab [Hydrocodone-Acetaminophen] Other (See Comments)    Bad dreams   Remeron [Mirtazapine] Other (See Comments)    Hallucinations (can take 1/2 tablet--7.5 mg), not 15 mg   Penicillins Rash and Other (See Comments)    Throat swelling, Has patient had a PCN reaction causing immediate rash, facial/tongue/throat swelling, SOB or lightheadedness with hypotension: Yes Has patient had a PCN reaction causing severe rash involving mucus membranes or skin necrosis: No Has patient had a PCN reaction that required hospitalization No Has patient had a PCN reaction occurring within the last 10 years: No If all of the above answers are "NO", then may proceed with Cephalosporin use.     Allergies as of 12/23/2023       Reactions   Ace Inhibitors Cough   Lortab [hydrocodone-acetaminophen] Other (See Comments)   Bad dreams   Remeron [mirtazapine] Other (See Comments)   Hallucinations (can take 1/2 tablet--7.5 mg), not 15 mg   Penicillins Rash, Other (See Comments)   Throat swelling, Has patient had a PCN reaction causing immediate rash, facial/tongue/throat swelling, SOB or lightheadedness with hypotension: Yes Has patient had a PCN reaction causing severe rash involving mucus membranes or skin necrosis: No Has patient had a PCN reaction that required hospitalization No Has patient had a PCN reaction occurring within the last 10 years: No If all of the above answers are "NO", then may proceed with Cephalosporin use.        Medication List        Accurate as of December 23, 2023  4:05 PM. If you have any questions, ask your nurse or doctor.          ACETAMINOPHEN ER PO Take 500 mg by mouth daily as needed for pain.   cycloSPORINE 0.05 % ophthalmic emulsion Commonly known as: RESTASIS Place 1 drop into both eyes daily.   FISH OIL PO Take 1 capsule by mouth daily.   losartan 50 MG tablet Commonly known as: COZAAR TAKE 1 TABLET BY MOUTH EVERY  DAY   multivitamin tablet Take 1 tablet by mouth daily.   OVER THE COUNTER MEDICATION Take 1 capsule by mouth daily. Gluco Gel Joint & Cartilage Support   OVER THE COUNTER MEDICATION Apply 1 application  topically daily as needed (pain relief). Youngevity Pain Cream   polyethylene glycol 17 g packet Commonly known as: MIRALAX / GLYCOLAX Take 17 g by mouth daily as needed for mild constipation.        Review of Systems  Constitutional:  Negative for appetite change, chills, fatigue, fever and unexpected weight change.  HENT:  Positive for hearing loss. Negative for congestion, ear discharge, ear pain, nosebleeds, postnasal drip, rhinorrhea, sinus pressure, sinus pain, sneezing, sore throat, tinnitus and trouble swallowing.   Eyes:  Negative for pain, discharge, redness, itching and visual disturbance.  Respiratory:  Negative  for cough, chest tightness, shortness of breath and wheezing.   Cardiovascular:  Negative for chest pain, palpitations and leg swelling.  Gastrointestinal:  Negative for abdominal distention, abdominal pain, blood in stool, constipation, diarrhea, nausea and vomiting.  Endocrine: Negative for cold intolerance, heat intolerance, polydipsia, polyphagia and polyuria.  Genitourinary:  Negative for difficulty urinating, dysuria, flank pain, frequency and urgency.  Musculoskeletal:  Positive for arthralgias and gait problem. Negative for back pain, joint swelling, myalgias, neck pain and neck stiffness.       Chronic Knee pain   Skin:  Negative for color change, pallor, rash and wound.  Neurological:  Negative for dizziness, syncope, speech difficulty, weakness, light-headedness, numbness and headaches.       Sometimes has dizziness   Hematological:  Does not bruise/bleed easily.  Psychiatric/Behavioral:  Negative for agitation, behavioral problems, confusion, hallucinations, self-injury, sleep disturbance and suicidal ideas. The patient is not nervous/anxious.      Immunization History  Administered Date(s) Administered   Fluad Quad(high Dose 65+) 09/07/2021, 09/24/2022   Fluad Trivalent(High Dose 65+) 09/06/2023   Influenza, High Dose Seasonal PF 08/13/2019   Influenza,inj,quad, With Preservative 09/15/2017   Janssen (J&J) SARS-COV-2 Vaccination 02/03/2020   Pneumococcal Conjugate-13 08/18/2015   Pneumococcal Polysaccharide-23 12/13/2017   Tdap 10/11/2017   Zoster Recombinant(Shingrix) 10/11/2017, 12/13/2017   Zoster, Live 10/12/2017   Pertinent  Health Maintenance Due  Topic Date Due   INFLUENZA VACCINE  Completed   DEXA SCAN  Completed      05/23/2023    3:52 PM 06/20/2023    3:39 PM 09/06/2023    4:07 PM 11/15/2023   10:17 AM 12/23/2023    3:39 PM  Fall Risk  Falls in the past year? 1 1 1  0 1  Was there an injury with Fall? 1 1 1  0 0  Fall Risk Category Calculator 3 3 2  0 1  Patient at Risk for Falls Due to History of fall(s);Impaired balance/gait;Impaired mobility History of fall(s);Impaired balance/gait;Impaired mobility   History of fall(s)  Fall risk Follow up  Falls evaluation completed   Falls evaluation completed   Functional Status Survey:    Vitals:   12/23/23 1531  BP: 122/88  Pulse: 78  Resp: 18  Temp: (!) 96.8 F (36 C)  SpO2: 99%  Weight: 147 lb 12.8 oz (67 kg)  Height: 5\' 4"  (1.626 m)   Body mass index is 25.37 kg/m. Physical Exam Vitals reviewed.  Constitutional:      General: She is not in acute distress.    Appearance: Normal appearance. She is overweight. She is not ill-appearing or diaphoretic.  HENT:     Head: Normocephalic.     Right Ear: Tympanic membrane, ear canal and external ear normal. There is no impacted cerumen.     Left Ear: Tympanic membrane, ear canal and external ear normal. There is no impacted cerumen.     Nose: Nose normal. No congestion or rhinorrhea.     Mouth/Throat:     Mouth: Mucous membranes are moist.     Pharynx: Oropharynx is clear. No oropharyngeal exudate or  posterior oropharyngeal erythema.  Eyes:     General: No scleral icterus.       Right eye: No discharge.        Left eye: No discharge.     Extraocular Movements: Extraocular movements intact.     Conjunctiva/sclera: Conjunctivae normal.     Pupils: Pupils are equal, round, and reactive to light.  Neck:     Vascular:  No carotid bruit.  Cardiovascular:     Rate and Rhythm: Normal rate and regular rhythm.     Pulses: Normal pulses.     Heart sounds: Normal heart sounds. No murmur heard.    No friction rub. No gallop.  Pulmonary:     Effort: Pulmonary effort is normal. No respiratory distress.     Breath sounds: Normal breath sounds. No wheezing, rhonchi or rales.  Chest:     Chest wall: No tenderness.  Abdominal:     General: Bowel sounds are normal. There is no distension.     Palpations: Abdomen is soft. There is no mass.     Tenderness: There is no abdominal tenderness. There is no right CVA tenderness, left CVA tenderness, guarding or rebound.  Musculoskeletal:        General: No swelling.     Cervical back: Normal range of motion. No rigidity or tenderness.     Right knee: No swelling, effusion, erythema or ecchymosis. Decreased range of motion. Tenderness present.     Left knee: Normal.     Right lower leg: No edema.     Left lower leg: No edema.  Lymphadenopathy:     Cervical: No cervical adenopathy.  Skin:    General: Skin is warm and dry.     Coloration: Skin is not pale.     Findings: No bruising, erythema, lesion or rash.  Neurological:     Mental Status: She is alert. Mental status is at baseline.     Cranial Nerves: No cranial nerve deficit.     Sensory: No sensory deficit.     Motor: No weakness.     Coordination: Coordination normal.     Gait: Gait abnormal.  Psychiatric:        Mood and Affect: Mood normal.        Speech: Speech normal.        Behavior: Behavior normal.        Thought Content: Thought content normal.     Labs reviewed: Recent Labs     01/05/23 1552 03/27/23 1347 06/11/23 2118  NA 140 140 140  K 4.4 3.8 4.1  CL 108 108 109  CO2 22 23 23   GLUCOSE 88 88 104*  BUN 21 18 17   CREATININE 1.02* 0.95 1.09*  CALCIUM 9.7 9.3 9.1   Recent Labs    03/27/23 1347  AST 23  ALT 17  ALKPHOS 89  BILITOT 0.8  PROT 6.2*  ALBUMIN 3.2*   Recent Labs    01/05/23 1552 03/27/23 1036 06/11/23 2118 06/20/23 1641  WBC 5.0 4.3 4.7 5.2  NEUTROABS 3,300  --  3.2 2,985  HGB 9.5* 10.1* 10.3* 10.3*  HCT 30.5* 32.3* 33.1* 32.6*  MCV 85.7 80.1 83.8 81.7  PLT 245 149* 150 160   Lab Results  Component Value Date   TSH 0.304 (L) 12/12/2022   No results found for: "HGBA1C" Lab Results  Component Value Date   CHOL 195 03/08/2022   HDL 59 03/08/2022   LDLCALC 122 (H) 03/08/2022   TRIG 57 03/08/2022   CHOLHDL 3.3 03/08/2022    Significant Diagnostic Results in last 30 days:  No results found.  Assessment/Plan  Fall Risk Multiple falls reported, including a fall today. No head injury. Occasional dizziness noted. Discussed fall prevention strategies, including sitting beside the bed before standing and using a walker. - Advise to sit beside the bed before standing - Encourage use of walker - Check blood pressure at home  occasionally  Urinary Incontinence Chronic urinary incontinence with episodes of leakage. Uses pull-ups and has difficulty holding urine. No signs of infection. Discussed importance of frequent pull-up changes and thorough skin cleaning to prevent infection. - Change pull-ups frequently - Clean skin thoroughly with wipes  2.Knee Pain Intermittent knee pain, exacerbated by cold weather. Discussed non-pharmacological interventions such as leg exercises and walking within the house. - Encourage leg exercises and stretching before getting up - Encourage walking within the house   Essential hypertension Well-controlled hypertension on losartan 50 mg daily. Blood pressure today was 122/88 mmHg. Discussed  continuing current medication regimen and checking for refills. - Continue losartan 50 mg daily - Check if losartan refills are needed  3.Chronic Kidney Disease  Chronic kidney disease with creatinine levels previously at 1.09 mg/dL, slightly elevated from 0.95 mg/dL. Discussed monitoring kidney function with a chemistry panel. - Order chemistry panel to monitor kidney function  4. Anemia Anemia with previous hemoglobin of 10.3 g/dL. Taking a multivitamin. Discussed rechecking hemoglobin levels to monitor anemia. - Order CBC to recheck hemoglobin levels  General Health Maintenance Discussed stable weight, good appetite, and no recent constipation. Discussed continuing current medications and keeping Miralax on hand for occasional use if needed. - Continue current medications: Tylenol 500 mg as needed, Restasis eye drops, multivitamin, omega-3 fatty acid, glassia gel, pain relief cream - Keep Miralax on hand for occasional use if needed  Follow-up - Order CBC and chemistry panel - Arrange for lab technician to come to the patient's location for blood draw.   Family/ staff Communication: Reviewed plan of care with patient and daughter verbalized understanding   Labs/tests ordered:  - CBC with Differential/Platelet - COMPLETE METABOLIC PANEL WITH GFR - TSH  Next Appointment : Return in about 6 months (around 06/21/2024) for medical mangement of chronic issues.Caesar Bookman, NP

## 2023-12-24 LAB — CBC WITH DIFFERENTIAL/PLATELET
Absolute Lymphocytes: 1449 {cells}/uL (ref 850–3900)
Absolute Monocytes: 267 {cells}/uL (ref 200–950)
Basophils Absolute: 52 {cells}/uL (ref 0–200)
Basophils Relative: 1.2 %
Eosinophils Absolute: 52 {cells}/uL (ref 15–500)
Eosinophils Relative: 1.2 %
HCT: 35.1 % (ref 35.0–45.0)
Hemoglobin: 10.8 g/dL — ABNORMAL LOW (ref 11.7–15.5)
MCH: 25.7 pg — ABNORMAL LOW (ref 27.0–33.0)
MCHC: 30.8 g/dL — ABNORMAL LOW (ref 32.0–36.0)
MCV: 83.6 fL (ref 80.0–100.0)
MPV: 13.2 fL — ABNORMAL HIGH (ref 7.5–12.5)
Monocytes Relative: 6.2 %
Neutro Abs: 2481 {cells}/uL (ref 1500–7800)
Neutrophils Relative %: 57.7 %
Platelets: 151 10*3/uL (ref 140–400)
RBC: 4.2 10*6/uL (ref 3.80–5.10)
RDW: 14.2 % (ref 11.0–15.0)
Total Lymphocyte: 33.7 %
WBC: 4.3 10*3/uL (ref 3.8–10.8)

## 2023-12-24 LAB — COMPLETE METABOLIC PANEL WITH GFR
AG Ratio: 1.4 (calc) (ref 1.0–2.5)
ALT: 21 U/L (ref 6–29)
AST: 28 U/L (ref 10–35)
Albumin: 3.8 g/dL (ref 3.6–5.1)
Alkaline phosphatase (APISO): 82 U/L (ref 37–153)
BUN/Creatinine Ratio: 19 (calc) (ref 6–22)
BUN: 19 mg/dL (ref 7–25)
CO2: 21 mmol/L (ref 20–32)
Calcium: 9.4 mg/dL (ref 8.6–10.4)
Chloride: 110 mmol/L (ref 98–110)
Creat: 0.98 mg/dL — ABNORMAL HIGH (ref 0.60–0.95)
Globulin: 2.8 g/dL (ref 1.9–3.7)
Glucose, Bld: 88 mg/dL (ref 65–139)
Potassium: 4.1 mmol/L (ref 3.5–5.3)
Sodium: 143 mmol/L (ref 135–146)
Total Bilirubin: 1 mg/dL (ref 0.2–1.2)
Total Protein: 6.6 g/dL (ref 6.1–8.1)
eGFR: 55 mL/min/{1.73_m2} — ABNORMAL LOW (ref >=60)

## 2023-12-24 LAB — TSH: TSH: 0.91 m[IU]/L (ref 0.40–4.50)

## 2024-01-13 ENCOUNTER — Telehealth: Payer: Self-pay

## 2024-01-13 ENCOUNTER — Encounter: Payer: Self-pay | Admitting: Adult Health

## 2024-01-13 ENCOUNTER — Ambulatory Visit: Payer: Medicare Other | Admitting: Adult Health

## 2024-01-13 VITALS — BP 126/72 | HR 60 | Temp 97.6°F | Resp 16 | Ht 64.0 in | Wt 144.0 lb

## 2024-01-13 DIAGNOSIS — I129 Hypertensive chronic kidney disease with stage 1 through stage 4 chronic kidney disease, or unspecified chronic kidney disease: Secondary | ICD-10-CM

## 2024-01-13 DIAGNOSIS — N183 Chronic kidney disease, stage 3 unspecified: Secondary | ICD-10-CM

## 2024-01-13 DIAGNOSIS — R350 Frequency of micturition: Secondary | ICD-10-CM

## 2024-01-13 DIAGNOSIS — H04123 Dry eye syndrome of bilateral lacrimal glands: Secondary | ICD-10-CM

## 2024-01-13 DIAGNOSIS — K5901 Slow transit constipation: Secondary | ICD-10-CM | POA: Diagnosis not present

## 2024-01-13 LAB — POCT URINALYSIS DIP (MANUAL ENTRY)
Bilirubin, UA: NEGATIVE
Blood, UA: NEGATIVE
Glucose, UA: NEGATIVE mg/dL
Ketones, POC UA: NEGATIVE mg/dL
Nitrite, UA: NEGATIVE
Spec Grav, UA: >=1.03 — AB (ref 1.010–1.025)
Urobilinogen, UA: 0.2 U/dL
pH, UA: 5 (ref 5.0–8.0)

## 2024-01-13 NOTE — Telephone Encounter (Signed)
Patient is coming in for an appt.

## 2024-01-13 NOTE — Progress Notes (Signed)
[facility] clinic  Provider:  Kenard Gower DNP  Code Status:  DNR  Goals of Care:     01/13/2024   10:52 AM  Advanced Directives  Does Patient Have a Medical Advance Directive? No  Would patient like information on creating a medical advance directive? No - Patient declined     Chief Complaint  Patient presents with   Acute Visit    Possible UTI   Discussed the use of AI scribe software for clinical note transcription with the patient, who gave verbal consent to proceed.  HPI: Patient is a 89 y.o. female seen today for an acute visit for possible UTI.   Tracy Faulkner is an 89 year old female with chronic kidney disease stage 3A who presents with urinary frequency and confusion. She is accompanied by her daughter, who is her primary caregiver.  She experiences urinary frequency and occasional confusion. There is no difficulty urinating, hematuria, or unusual odor. She had a urinary tract infection in December 2024, treated with Cipro. Currently, there is no dysuria, but confusion persists.  She feels depressed nearly every day, with anhedonia, insomnia, and overeating. She denies feelings of worthlessness or suicidal ideation. These symptoms impact her daily life.  Her medications include losartan 50 mg daily for hypertension, acetaminophen as needed, eye drops for dry eyes, a multivitamin, omega-3 fatty acids, and glucosamine, though she has not used glucosamine recently. She occasionally uses a pain cream and has not used Miralax recently.  She lives with her daughter in a multigenerational household, which includes a seven-year-old child. 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     Past Medical History:  Diagnosis Date   Anemia    Arthritis    Ataxia    Back problem    Bowel trouble    Breast cancer (HCC)    Carpal tunnel syndrome    Constipation    Disturbance, sleep    Dyspnea    increased exertion    Frequent  headaches    GI bleed    History of mammogram 2000   History of urinary tract infection    Hypertension    Obesity    Pain in both knees    Sepsis (HCC)    Urinary incontinence     Past Surgical History:  Procedure Laterality Date   BREAST SURGERY     for cancer-right breast   COLON SURGERY     secondary to bowel blockages   EYE SURGERY     cataract surgery bilateral    gastric stapling     KNEE ARTHROPLASTY Left 10/07/2016   Procedure: LEFT TOTAL KNEE ARTHROPLASTY WITH COMPUTER NAVIGATION;  Surgeon: Samson Frederic, MD;  Location: [facility];  Service: Orthopedics;  Laterality: Left;   ORIF FEMUR FRACTURE Right 12/13/2022   Procedure: OPEN REDUCTION INTERNAL FIXATION (ORIF) DISTAL FEMUR FRACTURE;  Surgeon: Roby Lofts, MD;  Location: [facility];  Service: Orthopedics;  Laterality: Right;    Allergies  Allergen Reactions   Ace Inhibitors Cough   Lortab [Hydrocodone-Acetaminophen] Other (See Comments)    Bad dreams   Remeron [Mirtazapine] Other (See Comments)    Hallucinations (can take 1/2 tablet--7.5 mg), not 15 mg   Penicillins Rash and Other (See Comments)    Throat swelling, Has patient had a PCN reaction causing immediate rash, facial/tongue/throat swelling, SOB or lightheadedness with hypotension: Yes Has patient had a PCN reaction causing severe rash involving mucus membranes or skin necrosis: No  Has patient had a PCN reaction that required hospitalization No Has patient had a PCN reaction occurring within the last 10 years: No If all of the above answers are "NO", then may proceed with Cephalosporin use.     Outpatient Encounter Medications as of 01/13/2024  Medication Sig   ACETAMINOPHEN ER PO Take 500 mg by mouth daily as needed for pain.   cycloSPORINE (RESTASIS) 0.05 % ophthalmic emulsion Place 1 drop into both eyes daily.   losartan (COZAAR) 50 MG tablet TAKE 1 TABLET BY MOUTH EVERY DAY   Multiple Vitamin (MULTIVITAMIN) tablet Take 1 tablet by mouth daily.    Omega-3 Fatty Acids (FISH OIL PO) Take 1 capsule by mouth daily.   OVER THE COUNTER MEDICATION Take 1 capsule by mouth daily. Gluco Gel Joint & Cartilage Support   OVER THE COUNTER MEDICATION Apply 1 application  topically daily as needed (pain relief). Youngevity Pain Cream   polyethylene glycol (MIRALAX / GLYCOLAX) 17 g packet Take 17 g by mouth daily as needed for mild constipation. (Patient not taking: Reported on 11/15/2023)   No facility-administered encounter medications on file as of 01/13/2024.    Review of Systems:  Review of Systems  Constitutional:  Negative for activity change, chills and fever.  HENT:  Negative for sneezing.   Gastrointestinal:  Negative for abdominal distention, abdominal pain, diarrhea, nausea and vomiting.  Genitourinary:  Positive for frequency. Negative for flank pain and urgency.  Psychiatric/Behavioral:  Negative for agitation.     Health Maintenance  Topic Date Due   Medicare Annual Wellness (AWV)  09/05/2024   DTaP/Tdap/Td (2 - Td or Tdap) 10/12/2027   Pneumonia Vaccine 82+ Years old  Completed   INFLUENZA VACCINE  Completed   DEXA SCAN  Completed   Zoster Vaccines- Shingrix  Completed   HPV VACCINES  Aged Out   COVID-19 Vaccine  Discontinued    Physical Exam: Vitals:   01/13/24 1054  BP: 126/72  Pulse: 60  Resp: 16  Temp: 97.6 F (36.4 C)  SpO2: 97%  Weight: 144 lb (65.3 kg)  Height: 5\' 4"  (1.626 m)   Body mass index is 24.72 kg/m. Physical Exam Constitutional:      Appearance: Normal appearance.  HENT:     Head: Normocephalic and atraumatic.     Nose: Nose normal.     Mouth/Throat:     Mouth: Mucous membranes are moist.  Eyes:     Conjunctiva/sclera: Conjunctivae normal.  Cardiovascular:     Rate and Rhythm: Normal rate and regular rhythm.  Pulmonary:     Effort: Pulmonary effort is normal.     Breath sounds: Normal breath sounds.  Abdominal:     General: Bowel sounds are normal.     Palpations: Abdomen is soft.   Musculoskeletal:        General: No signs of injury. Normal range of motion.     Cervical back: Normal range of motion.     Right lower leg: Edema present.     Left lower leg: Edema present.     Comments: Trace edema  Skin:    General: Skin is warm and dry.  Neurological:     Mental Status: She is alert.     Comments: Alert to self only  Psychiatric:        Mood and Affect: Mood normal.        Behavior: Behavior normal.    Labs reviewed: Basic Metabolic Panel: Recent Labs    03/27/23 1347 06/11/23 2118  12/23/23 1612  NA 140 140 143  K 3.8 4.1 4.1  CL 108 109 110  CO2 23 23 21   GLUCOSE 88 104* 88  BUN 18 17 19   CREATININE 0.95 1.09* 0.98*  CALCIUM 9.3 9.1 9.4  TSH  --   --  0.91   Liver Function Tests: Recent Labs    03/27/23 1347 12/23/23 1612  AST 23 28  ALT 17 21  ALKPHOS 89  --   BILITOT 0.8 1.0  PROT 6.2* 6.6  ALBUMIN 3.2*  --    No results for input(s): "LIPASE", "AMYLASE" in the last 8760 hours. No results for input(s): "AMMONIA" in the last 8760 hours. CBC: Recent Labs    06/11/23 2118 06/20/23 1641 12/23/23 1612  WBC 4.7 5.2 4.3  NEUTROABS 3.2 2,985 2,481  HGB 10.3* 10.3* 10.8*  HCT 33.1* 32.6* 35.1  MCV 83.8 81.7 83.6  PLT 150 160 151   Lipid Panel: No results for input(s): "CHOL", "HDL", "LDLCALC", "TRIG", "CHOLHDL", "LDLDIRECT" in the last 8760 hours. No results found for: "HGBA1C"  Procedures since last visit: No results found.  Assessment/Plan  1. Urinary frequency (Primary) -  Reports increased urinary frequency. No dysuria or hematuria reported. No recent UTIs. - POCT urinalysis dipstick -  nitrite negative, leukocytes trace, protein trace - Urine Culture  2. Benign hypertension with chronic kidney disease, stage III (HCC) -  Well controlled on Losartan 50mg  daily. -  Continue Losartan 50mg  daily.  3. Slow transit constipation -  continue Miralax 17 gm PO Q D PRN  4. Dry eyes -  Managed with Cyclosporine eye  drops. -Continue Cyclosporine eye drops daily.     General Health Maintenance -Encourage hydration to assist with urinary frequency.      Labs/tests ordered:  POC urine dipstick, urine culture   Ervin Rothbauer Medina-Vargas, NP

## 2024-01-15 LAB — URINE CULTURE
MICRO NUMBER:: 16090120
Result:: NO GROWTH
SPECIMEN QUALITY:: ADEQUATE

## 2024-02-24 ENCOUNTER — Telehealth: Payer: Self-pay

## 2024-02-24 NOTE — Telephone Encounter (Signed)
 Copied from CRM [id number]. Topic: Clinical - Medical Advice >> Feb 24, 2024  4:51 PM Tracy Faulkner wrote: Reason for CRM: A pvd screen was done which resulted in poor circulation in both legs. Also a mini cognitive test was done and she did not pass she may need further evaluation

## 2024-02-24 NOTE — Telephone Encounter (Signed)
 Schedule follow up appointment at the office.

## 2024-02-27 NOTE — Telephone Encounter (Signed)
 Per Carilyn Goodpasture, NP patient needs to be scheduled for in office appointment. Message routed to front admin

## 2024-02-27 NOTE — Telephone Encounter (Addendum)
 Faulkner, Tracy  You; Tracy Faulkner; Ngetich, Tracy Faulkner, NP6 minutes ago (11:05 AM)   AM Patient has been scheduled for 04/01 at 10:40 AM.

## 2024-02-27 NOTE — Telephone Encounter (Signed)
 Noted.

## 2024-02-28 ENCOUNTER — Ambulatory Visit (INDEPENDENT_AMBULATORY_CARE_PROVIDER_SITE_OTHER): Admitting: Family

## 2024-02-28 ENCOUNTER — Encounter: Payer: Self-pay | Admitting: Family

## 2024-02-28 VITALS — BP 118/64 | HR 77 | Temp 97.8°F | Resp 16 | Ht 64.0 in | Wt 147.8 lb

## 2024-02-28 DIAGNOSIS — I10 Essential (primary) hypertension: Secondary | ICD-10-CM | POA: Diagnosis not present

## 2024-02-28 DIAGNOSIS — G3184 Mild cognitive impairment, so stated: Secondary | ICD-10-CM | POA: Diagnosis not present

## 2024-02-28 DIAGNOSIS — I739 Peripheral vascular disease, unspecified: Secondary | ICD-10-CM

## 2024-02-28 NOTE — Progress Notes (Signed)
 Provider: Richarda Blade FNP-C  Vallie Fayette, Donalee Citrin, NP  Patient Care Team: Elishua Radford, Donalee Citrin, NP as PCP - General (Family Medicine) Lyn Records, MD (Inactive) as PCP - Cardiology (Cardiology) Lyn Records, MD (Inactive) as Consulting Physician (Cardiology) Tiaja Hagan, Donalee Citrin, NP (Family Medicine)  Extended Emergency Contact Information Primary Emergency Contact: Tomasa Hose [place] Home Phone: [phone] Mobile Phone: [phone] Relation: Daughter Secondary Emergency Contact: [facility] Phone: [phone] Relation: Daughter  Code Status:  Full Code  Goals of care: Advanced Directive information    02/28/2024   10:53 AM  Advanced Directives  Does Patient Have a Medical Advance Directive? No  Would patient like information on creating a medical advance directive? No - Patient declined     Chief Complaint  Patient presents with   Follow-up    Discussed the use of AI scribe software for clinical note transcription with the patient, who gave verbal consent to proceed.  History of Present Illness   Tracy Faulkner is an 89 year old female who presents with circulation issues in both legs. She is accompanied by her daughter. She was referred by a nurse practitioner from [employer] for evaluation of circulation issues.  She experiences occasional numbness and tingling in her legs, with pain particularly in her knees, described as feeling like a 'knot.' No redness or wounds are present on her legs. She wears compression socks daily, which have helped manage swelling, with no worsening noted. A test conducted at home indicated circulation issues, though details are unclear, and no ultrasound has been performed. She denies any ulceration on feet.   Nutritionally, she consumes about two meals a day and occasionally supplements with Ensure. She is physically active, leaving the house almost daily. Her bowel movements occur daily or every other  day, an improvement from before.  A mini memory test was conducted, which she did not pass, attributed to age-related cognitive decline. No specific medications are mentioned for her circulation issues, but she occasionally uses a pain cream for knee pain.   Past Medical History:  Diagnosis Date   Anemia    Arthritis    Ataxia    Back problem    Bowel trouble    Breast cancer (HCC)    Carpal tunnel syndrome    Constipation    Disturbance, sleep    Dyspnea    increased exertion    Frequent headaches    GI bleed    History of mammogram 2000   History of urinary tract infection    Hypertension    Obesity    Pain in both knees    Sepsis (HCC)    Urinary incontinence    Past Surgical History:  Procedure Laterality Date   BREAST SURGERY     for cancer-right breast   COLON SURGERY     secondary to bowel blockages   EYE SURGERY     cataract surgery bilateral    gastric stapling     KNEE ARTHROPLASTY Left 10/07/2016   Procedure: LEFT TOTAL KNEE ARTHROPLASTY WITH COMPUTER NAVIGATION;  Surgeon: Samson Frederic, MD;  Location: [facility];  Service: Orthopedics;  Laterality: Left;   ORIF FEMUR FRACTURE Right 12/13/2022   Procedure: OPEN REDUCTION INTERNAL FIXATION (ORIF) DISTAL FEMUR FRACTURE;  Surgeon: Roby Lofts, MD;  Location: [facility];  Service: Orthopedics;  Laterality: Right;    Allergies  Allergen Reactions   Ace Inhibitors Cough   Lortab [Hydrocodone-Acetaminophen] Other (See Comments)    Bad dreams  Remeron [Mirtazapine] Other (See Comments)    Hallucinations (can take 1/2 tablet--7.5 mg), not 15 mg   Penicillins Rash and Other (See Comments)    Throat swelling, Has patient had a PCN reaction causing immediate rash, facial/tongue/throat swelling, SOB or lightheadedness with hypotension: Yes Has patient had a PCN reaction causing severe rash involving mucus membranes or skin necrosis: No Has patient had a PCN reaction that required hospitalization No Has patient had a PCN  reaction occurring within the last 10 years: No If all of the above answers are "NO", then may proceed with Cephalosporin use.     Outpatient Encounter Medications as of 02/28/2024  Medication Sig   ACETAMINOPHEN ER PO Take 500 mg by mouth daily as needed for pain.   cycloSPORINE (RESTASIS) 0.05 % ophthalmic emulsion Place 1 drop into both eyes daily.   losartan (COZAAR) 50 MG tablet TAKE 1 TABLET BY MOUTH EVERY DAY   Multiple Vitamin (MULTIVITAMIN) tablet Take 1 tablet by mouth daily.   Omega-3 Fatty Acids (FISH OIL PO) Take 1 capsule by mouth daily.   OVER THE COUNTER MEDICATION Apply 1 application  topically daily as needed (pain relief). Youngevity Pain Cream   OVER THE COUNTER MEDICATION Take 1 capsule by mouth daily. Gluco Gel Joint & Cartilage Support (Patient not taking: Reported on 02/28/2024)   polyethylene glycol (MIRALAX / GLYCOLAX) 17 g packet Take 17 g by mouth daily as needed for mild constipation. (Patient not taking: Reported on 02/28/2024)   No facility-administered encounter medications on file as of 02/28/2024.    Review of Systems  Constitutional:  Negative for appetite change, chills, fatigue, fever and unexpected weight change.  Eyes:  Negative for pain, discharge, redness, itching and visual disturbance.  Respiratory:  Negative for cough, chest tightness, shortness of breath and wheezing.   Cardiovascular:  Positive for leg swelling. Negative for chest pain and palpitations.  Gastrointestinal:  Negative for abdominal distention, abdominal pain, constipation, diarrhea, nausea and vomiting.  Musculoskeletal:  Positive for gait problem. Negative for arthralgias, back pain, joint swelling, myalgias, neck pain and neck stiffness.  Skin:  Negative for color change, pallor, rash and wound.  Neurological:  Negative for dizziness, weakness, light-headedness, numbness and headaches.  Hematological:  Does not bruise/bleed easily.    Immunization History  Administered Date(s)  Administered   Fluad Quad(high Dose 65+) 09/07/2021, 09/24/2022   Fluad Trivalent(High Dose 65+) 09/06/2023   Influenza, High Dose Seasonal PF 08/13/2019   Influenza,inj,quad, With Preservative 09/15/2017   Janssen (J&J) SARS-COV-2 Vaccination 02/03/2020   Pneumococcal Conjugate-13 08/18/2015   Pneumococcal Polysaccharide-23 12/13/2017   Tdap 10/11/2017   Zoster Recombinant(Shingrix) 10/11/2017, 12/13/2017   Zoster, Live 10/12/2017   Pertinent  Health Maintenance Due  Topic Date Due   INFLUENZA VACCINE  06/29/2024   DEXA SCAN  Completed      09/06/2023    4:07 PM 11/15/2023   10:17 AM 12/23/2023    3:39 PM 01/13/2024   10:49 AM 02/28/2024   10:51 AM  Fall Risk  Falls in the past year? 1 0 1 1 1   Was there an injury with Fall? 1 0 0 0 0  Fall Risk Category Calculator 2 0 1 1 1   Patient at Risk for Falls Due to   History of fall(s)  History of fall(s)  Fall risk Follow up   Falls evaluation completed  Falls evaluation completed   Functional Status Survey:    Vitals:   02/28/24 1055  BP: 118/64  Pulse: 77  Resp: 16  Temp: 97.8 F (36.6 C)  SpO2: 97%  Weight: 147 lb 12.8 oz (67 kg)  Height: 5\' 4"  (1.626 m)   Body mass index is 25.37 kg/m. Physical Exam  VITALS: T- 97.8, P- 77, BP- 118/64, SaO2- 97% MEASUREMENTS: Weight- 147.8. GENERAL: Alert, cooperative, well developed, no acute distress. HEENT: Normocephalic, normal oropharynx, moist mucous membranes. CHEST: Clear to auscultation bilaterally, no wheezes, rhonchi, or crackles. CARDIOVASCULAR: Normal heart rate and rhythm, S1 and S2 normal without murmurs. ABDOMEN: Soft, non-tender, non-distended, without organomegaly, normal bowel sounds. EXTREMITIES: Peripheral pulses palpable bilaterally, no redness, swelling, cyanosis, or edema. NEUROLOGICAL: Cranial nerves grossly intact, moves all extremities without gross motor or sensory deficit. SKIN: No rash, no lesions, or erythema   Labs reviewed: Recent Labs     03/27/23 1347 06/11/23 2118 12/23/23 1612  NA 140 140 143  K 3.8 4.1 4.1  CL 108 109 110  CO2 23 23 21   GLUCOSE 88 104* 88  BUN 18 17 19   CREATININE 0.95 1.09* 0.98*  CALCIUM 9.3 9.1 9.4   Recent Labs    03/27/23 1347 12/23/23 1612  AST 23 28  ALT 17 21  ALKPHOS 89  --   BILITOT 0.8 1.0  PROT 6.2* 6.6  ALBUMIN 3.2*  --    Recent Labs    06/11/23 2118 06/20/23 1641 12/23/23 1612  WBC 4.7 5.2 4.3  NEUTROABS 3.2 2,985 2,481  HGB 10.3* 10.3* 10.8*  HCT 33.1* 32.6* 35.1  MCV 83.8 81.7 83.6  PLT 150 160 151   Lab Results  Component Value Date   TSH 0.91 12/23/2023   No results found for: "HGBA1C" Lab Results  Component Value Date   CHOL 195 03/08/2022   HDL 59 03/08/2022   LDLCALC 122 (H) 03/08/2022   TRIG 57 03/08/2022   CHOLHDL 3.3 03/08/2022    Significant Diagnostic Results in last 30 days:  No results found.  Assessment/Plan  Peripheral Arterial Disease Circulation in both legs is adequate with strong pulses and no changes in toe color. Occasional knee pain is reported, but there are no wounds, redness, or significant swelling. Compression stockings are being used effectively. - Continue wearing compression stockings daily - Monitor for any changes in leg symptoms or appearance - No further workup desired   Cognitive Impairment Age-related cognitive decline is evident as she did not pass a mini memory test. This is a common issue in elderly patients, requiring management and monitoring. - Encourage a healthy diet to support cognitive function  General Health Maintenance Vital signs are within normal limits: blood pressure 118/64, heart rate 77, oxygen saturation 97%. She has gained weight since the last visit, is eating well with two meals a day and occasional nutritional supplements, and engages in daily outdoor activities. - Encourage continued healthy eating habits - Promote regular physical activity  Family/ staff Communication: Reviewed plan  of care with patient and daughter verbalized understanding   Labs/tests ordered: None   Next Appointment: Return if symptoms worsen or fail to improve.   Total time: 20 minutes. Greater than 50% of total time spent doing patient education regarding PVD,cognitive impairment ,health maintenance including symptom/medication management.   Caesar Bookman, NP

## 2024-05-11 ENCOUNTER — Ambulatory Visit: Admitting: Adult Health

## 2024-05-11 ENCOUNTER — Encounter: Payer: Self-pay | Admitting: Adult Health

## 2024-05-11 VITALS — BP 138/80 | HR 65 | Temp 98.5°F | Ht 64.0 in | Wt 142.4 lb

## 2024-05-11 DIAGNOSIS — G3184 Mild cognitive impairment, so stated: Secondary | ICD-10-CM

## 2024-05-11 DIAGNOSIS — I10 Essential (primary) hypertension: Secondary | ICD-10-CM

## 2024-05-11 DIAGNOSIS — R32 Unspecified urinary incontinence: Secondary | ICD-10-CM | POA: Diagnosis not present

## 2024-05-11 NOTE — Progress Notes (Unsigned)
 [facility] clinic  Provider:  Inge Mangle DNP  Code Status:  DNR  Goals of Care:     02/28/2024   10:53 AM  Advanced Directives  Does Patient Have a Medical Advance Directive? No  Would patient like information on creating a medical advance directive? No - Patient declined     Chief Complaint  Patient presents with   Urinary Tract Infection   Discussed the use of AI scribe software for clinical note transcription with the patient, who gave verbal consent to proceed.  HPI: Patient is a 89 y.o. female seen today for an acute visit for possible UTI. She was accompanied today by her daughter.  Tracy Faulkner is a 89 year old female with hypertension and Alzheimer's disease who presents with worsening urinary incontinence. She is accompanied by her daughter, who is her primary caregiver.  Over the past two months, she has experienced worsening urinary incontinence, characterized by increased frequency and urgency. She describes the sensation as 'running through her body' and has difficulty holding urine as effectively as before. She has been wearing pull-ups due to incontinence. No fever, dysuria, or hematuria, although she notes that her urine sometimes has a smell.  Her medical history includes hypertension, managed with losartan  50 mg daily. She also uses compression socks daily for swelling in her bilateral lower legs.  She has mild cognitive impairment, which affects her memory and daily functioning. Her daughter assists with her care and medication management.    Past Medical History:  Diagnosis Date   Anemia    Arthritis    Ataxia    Back problem    Bowel trouble    Breast cancer (HCC)    Carpal tunnel syndrome    Constipation    Disturbance, sleep    Dyspnea    increased exertion    Frequent headaches    GI bleed    History of mammogram 2000   History of urinary tract infection    Hypertension    Obesity    Pain in both knees    Sepsis (HCC)    Urinary  incontinence     Past Surgical History:  Procedure Laterality Date   BREAST SURGERY     for cancer-right breast   COLON SURGERY     secondary to bowel blockages   EYE SURGERY     cataract surgery bilateral    gastric stapling     KNEE ARTHROPLASTY Left 10/07/2016   Procedure: LEFT TOTAL KNEE ARTHROPLASTY WITH COMPUTER NAVIGATION;  Surgeon: Adonica Hoose, MD;  Location: [facility];  Service: Orthopedics;  Laterality: Left;   ORIF FEMUR FRACTURE Right 12/13/2022   Procedure: OPEN REDUCTION INTERNAL FIXATION (ORIF) DISTAL FEMUR FRACTURE;  Surgeon: Laneta Pintos, MD;  Location: [facility];  Service: Orthopedics;  Laterality: Right;    Allergies  Allergen Reactions   Ace Inhibitors Cough   Lortab [Hydrocodone -Acetaminophen ] Other (See Comments)    Bad dreams   Remeron  [Mirtazapine ] Other (See Comments)    Hallucinations (can take 1/2 tablet--7.5 mg), not 15 mg   Penicillins Rash and Other (See Comments)    Throat swelling, Has patient had a PCN reaction causing immediate rash, facial/tongue/throat swelling, SOB or lightheadedness with hypotension: Yes Has patient had a PCN reaction causing severe rash involving mucus membranes or skin necrosis: No Has patient had a PCN reaction that required hospitalization No Has patient had a PCN reaction occurring within the last 10 years: No If all of the above answers are NO,  then may proceed with Cephalosporin use.     Outpatient Encounter Medications as of 05/11/2024  Medication Sig   ACETAMINOPHEN  ER PO Take 500 mg by mouth daily as needed for pain.   cycloSPORINE  (RESTASIS ) 0.05 % ophthalmic emulsion Place 1 drop into both eyes daily.   losartan  (COZAAR ) 50 MG tablet TAKE 1 TABLET BY MOUTH EVERY DAY   Multiple Vitamin (MULTIVITAMIN) tablet Take 1 tablet by mouth daily.   Omega-3 Fatty Acids (FISH OIL PO) Take 1 capsule by mouth daily.   OVER THE COUNTER MEDICATION Take 1 capsule by mouth daily. Gluco Gel Joint & Cartilage Support   OVER THE  COUNTER MEDICATION Apply 1 application  topically daily as needed (pain relief). Youngevity Pain Cream   polyethylene glycol (MIRALAX  / GLYCOLAX ) 17 g packet Take 17 g by mouth daily as needed for mild constipation.   No facility-administered encounter medications on file as of 05/11/2024.    Review of Systems:  Review of Systems  Constitutional:  Negative for appetite change, chills, fatigue and fever.  HENT:  Negative for congestion, hearing loss, rhinorrhea and sore throat.   Eyes: Negative.   Respiratory:  Negative for cough, shortness of breath and wheezing.   Cardiovascular:  Negative for chest pain, palpitations and leg swelling.  Gastrointestinal:  Negative for abdominal pain, constipation, diarrhea, nausea and vomiting.  Genitourinary:  Negative for dysuria.       Urinary incontinence  Musculoskeletal:  Negative for arthralgias, back pain and myalgias.  Skin:  Negative for color change, rash and wound.  Neurological:  Negative for dizziness, weakness and headaches.  Psychiatric/Behavioral:  Negative for behavioral problems. The patient is not nervous/anxious.     Health Maintenance  Topic Date Due   INFLUENZA VACCINE  06/29/2024   Medicare Annual Wellness (AWV)  09/05/2024   DTaP/Tdap/Td (2 - Td or Tdap) 10/12/2027   Pneumococcal Vaccine: 50+ Years  Completed   DEXA SCAN  Completed   Zoster Vaccines- Shingrix  Completed   HPV VACCINES  Aged Out   Meningococcal B Vaccine  Aged Out   COVID-19 Vaccine  Discontinued    Physical Exam: Vitals:   05/11/24 1110  BP: 138/80  Pulse: 65  Temp: 98.5 F (36.9 C)  SpO2: 94%  Weight: 142 lb 6.4 oz (64.6 kg)  Height: 5' 4 (1.626 m)   Body mass index is 24.44 kg/m. Physical Exam Constitutional:      General: She is not in acute distress.    Appearance: Normal appearance.  HENT:     Head: Normocephalic and atraumatic.     Nose: Nose normal.     Mouth/Throat:     Mouth: Mucous membranes are moist.   Eyes:      Conjunctiva/sclera: Conjunctivae normal.    Cardiovascular:     Rate and Rhythm: Normal rate and regular rhythm.  Pulmonary:     Effort: Pulmonary effort is normal.     Breath sounds: Normal breath sounds.  Abdominal:     General: Bowel sounds are normal.     Palpations: Abdomen is soft.   Musculoskeletal:        General: Normal range of motion.     Cervical back: Normal range of motion.   Skin:    General: Skin is warm and dry.   Neurological:     Mental Status: She is alert.   Psychiatric:        Mood and Affect: Mood normal.        Behavior: Behavior  normal.        Thought Content: Thought content normal.        Judgment: Judgment normal.     Labs reviewed: Basic Metabolic Panel: Recent Labs    06/11/23 2118 12/23/23 1612  NA 140 143  K 4.1 4.1  CL 109 110  CO2 23 21  GLUCOSE 104* 88  BUN 17 19  CREATININE 1.09* 0.98*  CALCIUM 9.1 9.4  TSH  --  0.91   Liver Function Tests: Recent Labs    12/23/23 1612  AST 28  ALT 21  BILITOT 1.0  PROT 6.6   No results for input(s): LIPASE, AMYLASE in the last 8760 hours. No results for input(s): AMMONIA in the last 8760 hours. CBC: Recent Labs    06/11/23 2118 06/20/23 1641 12/23/23 1612  WBC 4.7 5.2 4.3  NEUTROABS 3.2 2,985 2,481  HGB 10.3* 10.3* 10.8*  HCT 33.1* 32.6* 35.1  MCV 83.8 81.7 83.6  PLT 150 160 151   Lipid Panel: No results for input(s): CHOL, HDL, LDLCALC, TRIG, CHOLHDL, LDLDIRECT in the last 8760 hours. No results found for: HGBA1C  Procedures since last visit: No results found.  Assessment/Plan  1. Urinary incontinence, unspecified type (Primary) -  Worsening incontinence over two months. Differential includes UTI or age-related incontinence. Unable to provide urine sample. - Daughter to provide urine specimen for UTI analysis. - Urinalysis - Urine Culture  2. Primary hypertension -  Blood pressure 138/80 mmHg. Managed with losartan  50 mg daily. - Continue  losartan  50 mg daily.  3. MCI (mild cognitive impairment) -  continue supportive care   General Health Maintenance - Continue compression socks daily.    Labs/tests ordered:   urinalysis and urine culture   Return if symptoms worsen or fail to improve.  Ariyanah Aguado Medina-Vargas, NP

## 2024-05-15 LAB — URINALYSIS
Bilirubin Urine: NEGATIVE
Glucose, UA: NEGATIVE
Hgb urine dipstick: NEGATIVE
Ketones, ur: NEGATIVE
Nitrite: POSITIVE — AB
Specific Gravity, Urine: 1.025 (ref 1.001–1.035)
pH: >=8.5 — AB (ref 5.0–8.0)

## 2024-05-15 LAB — URINE CULTURE
MICRO NUMBER:: 16586122
SPECIMEN QUALITY:: ADEQUATE

## 2024-05-16 ENCOUNTER — Ambulatory Visit: Payer: Self-pay | Admitting: Adult Health

## 2024-05-16 NOTE — Progress Notes (Signed)
 Urine culture has mixed flora , probably due to contamination during specimen collection. If she is still having symptoms, will need to have recollection of specimen, pls let us  know so we can put in order.

## 2024-05-21 ENCOUNTER — Telehealth: Payer: Self-pay | Admitting: *Deleted

## 2024-05-21 NOTE — Telephone Encounter (Signed)
 Copied from CRM [id number]. Topic: Clinical - Lab/Test Results >> May 21, 2024  8:35 AM Rosaria A wrote: Reason for CRM: Tracy Faulkner patients daughter called back for urine culture results. Read the results to Tracy Faulkner. Per Tracy Faulkner patient is not complaining anymore and their is no odor. Tracy Faulkner will check today make sure their is no further issues. If their is she will call back so a new order can be placed.       Medina-Vargas, Monina C, NP to [facility]  (Selected Message)    05/16/24 11:57 AM Note Urine culture has mixed flora , probably due to contamination during specimen collection. If she is still having symptoms, will need to have recollection of specimen, pls let us  know so we can put in order.

## 2024-06-21 ENCOUNTER — Ambulatory Visit (INDEPENDENT_AMBULATORY_CARE_PROVIDER_SITE_OTHER): Payer: Medicare Other | Admitting: Family

## 2024-06-21 ENCOUNTER — Encounter: Payer: Self-pay | Admitting: Family

## 2024-06-21 VITALS — BP 122/78 | HR 60 | Temp 97.6°F | Resp 17 | Ht 64.0 in | Wt 141.8 lb

## 2024-06-21 DIAGNOSIS — R32 Unspecified urinary incontinence: Secondary | ICD-10-CM

## 2024-06-21 DIAGNOSIS — E785 Hyperlipidemia, unspecified: Secondary | ICD-10-CM | POA: Diagnosis not present

## 2024-06-21 DIAGNOSIS — N183 Chronic kidney disease, stage 3 unspecified: Secondary | ICD-10-CM

## 2024-06-21 DIAGNOSIS — R2681 Unsteadiness on feet: Secondary | ICD-10-CM

## 2024-06-21 DIAGNOSIS — I129 Hypertensive chronic kidney disease with stage 1 through stage 4 chronic kidney disease, or unspecified chronic kidney disease: Secondary | ICD-10-CM | POA: Diagnosis not present

## 2024-06-21 NOTE — Progress Notes (Signed)
 Provider: Roxan Plough FNP-C   Tedi Hughson, Roxan BROCKS, NP  Patient Care Team: Tyshawna Alarid, Roxan BROCKS, NP as PCP - General (Family Medicine) Claudene Victory ORN, MD (Inactive) as PCP - Cardiology (Cardiology) Claudene Victory ORN, MD (Inactive) as Consulting Physician (Cardiology) Tyner Codner, Roxan BROCKS, NP (Family Medicine)  Extended Emergency Contact Information Primary Emergency Contact: Rudine Milling  [place] Home Phone: [phone] Mobile Phone: [phone] Relation: Daughter Secondary Emergency Contact: [facility] Phone: [phone] Relation: Daughter  Code Status:  Full Code  Goals of care: Advanced Directive information    06/21/2024    3:50 PM  Advanced Directives  Does Patient Have a Medical Advance Directive? No  Would patient like information on creating a medical advance directive? No - Patient declined     Chief Complaint  Patient presents with   Medical Management of Chronic Issues    6 Month follow up     Discussed the use of AI scribe software for clinical note transcription with the patient, who gave verbal consent to proceed.  History of Present Illness   Tracy Faulkner is a 89 year old female who presents for a routine follow-up visit.  She has experienced two recent falls, one due to losing her balance, but sustained no injuries.  Her current medications include a multivitamin, omega-3 fatty acids, Tylenol  as needed, Restasis  for her eyes, and losartan  50 mg once daily for blood pressure management. She no longer uses Miralax  as her bowel movements have been regular. Additionally, she takes Lufa gel tablets for bone health.  She experiences urinary incontinence, described as a 'hot, warm rush,' and manages this with pull-ups and an extra pad. No burning sensation when urinating and no recent blood in her stool.  She has difficulty sleeping at night but can return to sleep after waking to use the bathroom.  She experiences arthritis pain,  particularly in her forehead, and takes Tylenol  as needed for pain management.  Her appetite is described as 'too good,' although she does not eat much beyond her meals and some snacks. Her last recorded weight was 141.8 pounds, slightly down from 142 pounds in June.  Her immunizations are up to date, except for the flu shot. Her last hemoglobin level was 10.8.    Past Medical History:  Diagnosis Date   Anemia    Arthritis    Ataxia    Back problem    Bowel trouble    Breast cancer (HCC)    Carpal tunnel syndrome    Constipation    Disturbance, sleep    Dyspnea    increased exertion    Frequent headaches    GI bleed    History of mammogram 2000   History of urinary tract infection    Hypertension    Obesity    Pain in both knees    Sepsis (HCC)    Urinary incontinence    Past Surgical History:  Procedure Laterality Date   BREAST SURGERY     for cancer-right breast   COLON SURGERY     secondary to bowel blockages   EYE SURGERY     cataract surgery bilateral    gastric stapling     KNEE ARTHROPLASTY Left 10/07/2016   Procedure: LEFT TOTAL KNEE ARTHROPLASTY WITH COMPUTER NAVIGATION;  Surgeon: Redell Shoals, MD;  Location: [facility];  Service: Orthopedics;  Laterality: Left;   ORIF FEMUR FRACTURE Right 12/13/2022   Procedure: OPEN REDUCTION INTERNAL FIXATION (ORIF) DISTAL FEMUR FRACTURE;  Surgeon: Kendal Franky SQUIBB,  MD;  Location: [facility];  Service: Orthopedics;  Laterality: Right;    Allergies  Allergen Reactions   Ace Inhibitors Cough   Lortab [Hydrocodone -Acetaminophen ] Other (See Comments)    Bad dreams   Remeron  [Mirtazapine ] Other (See Comments)    Hallucinations (can take 1/2 tablet--7.5 mg), not 15 mg   Penicillins Rash and Other (See Comments)    Throat swelling, Has patient had a PCN reaction causing immediate rash, facial/tongue/throat swelling, SOB or lightheadedness with hypotension: Yes Has patient had a PCN reaction causing severe rash involving mucus  membranes or skin necrosis: No Has patient had a PCN reaction that required hospitalization No Has patient had a PCN reaction occurring within the last 10 years: No If all of the above answers are NO, then may proceed with Cephalosporin use.     Allergies as of 06/21/2024       Reactions   Ace Inhibitors Cough   Lortab [hydrocodone -acetaminophen ] Other (See Comments)   Bad dreams   Remeron  [mirtazapine ] Other (See Comments)   Hallucinations (can take 1/2 tablet--7.5 mg), not 15 mg   Penicillins Rash, Other (See Comments)   Throat swelling, Has patient had a PCN reaction causing immediate rash, facial/tongue/throat swelling, SOB or lightheadedness with hypotension: Yes Has patient had a PCN reaction causing severe rash involving mucus membranes or skin necrosis: No Has patient had a PCN reaction that required hospitalization No Has patient had a PCN reaction occurring within the last 10 years: No If all of the above answers are NO, then may proceed with Cephalosporin use.        Medication List        Accurate as of June 21, 2024  4:30 PM. If you have any questions, ask your nurse or doctor.          ACETAMINOPHEN  ER PO Take 500 mg by mouth daily as needed for pain.   cycloSPORINE  0.05 % ophthalmic emulsion Commonly known as: RESTASIS  Place 1 drop into both eyes daily.   FISH OIL PO Take 1 capsule by mouth daily.   losartan  50 MG tablet Commonly known as: COZAAR  TAKE 1 TABLET BY MOUTH EVERY DAY   multivitamin tablet Take 1 tablet by mouth daily.   OVER THE COUNTER MEDICATION Take 1 capsule by mouth daily. Gluco Gel Joint & Cartilage Support   OVER THE COUNTER MEDICATION Apply 1 application  topically daily as needed (pain relief). Youngevity Pain Cream   polyethylene glycol 17 g packet Commonly known as: MIRALAX  / GLYCOLAX  Take 17 g by mouth daily as needed for mild constipation.        Review of Systems  Constitutional:  Negative for appetite  change, chills, fatigue, fever and unexpected weight change.  HENT:  Negative for congestion, dental problem, ear discharge, ear pain, hearing loss, nosebleeds, postnasal drip, rhinorrhea, sinus pressure, sinus pain, sneezing, sore throat, tinnitus and trouble swallowing.   Eyes:  Negative for pain, discharge, redness, itching and visual disturbance.  Respiratory:  Negative for cough, chest tightness, shortness of breath and wheezing.   Cardiovascular:  Negative for chest pain, palpitations and leg swelling.  Gastrointestinal:  Negative for abdominal distention, abdominal pain, blood in stool, constipation, diarrhea, nausea and vomiting.  Endocrine: Negative for cold intolerance, heat intolerance, polydipsia, polyphagia and polyuria.  Genitourinary:  Negative for difficulty urinating, dysuria, flank pain, frequency and urgency.       Incontinent   Musculoskeletal:  Positive for arthralgias and gait problem. Negative for back pain, joint swelling, myalgias,  neck pain and neck stiffness.  Skin:  Negative for color change, pallor, rash and wound.  Neurological:  Negative for dizziness, syncope, speech difficulty, weakness, light-headedness, numbness and headaches.  Hematological:  Does not bruise/bleed easily.  Psychiatric/Behavioral:  Negative for agitation, behavioral problems, confusion, hallucinations and sleep disturbance. The patient is not nervous/anxious.     Immunization History  Administered Date(s) Administered   Fluad Quad(high Dose 65+) 09/07/2021, 09/24/2022   Fluad Trivalent(High Dose 65+) 09/06/2023   Influenza, High Dose Seasonal PF 08/13/2019   Influenza,inj,quad, With Preservative 09/15/2017   Janssen (J&J) SARS-COV-2 Vaccination 02/03/2020   Pneumococcal Conjugate-13 08/18/2015   Pneumococcal Polysaccharide-23 12/13/2017   Tdap 10/11/2017   Zoster Recombinant(Shingrix) 10/11/2017, 12/13/2017   Zoster, Live 10/12/2017   Pertinent  Health Maintenance Due  Topic Date Due    INFLUENZA VACCINE  06/29/2024   DEXA SCAN  Completed      11/15/2023   10:17 AM 12/23/2023    3:39 PM 01/13/2024   10:49 AM 02/28/2024   10:51 AM 06/21/2024    3:49 PM  Fall Risk  Falls in the past year? 0 1 1 1 1   Was there an injury with Fall? 0 0 0 0 0  Fall Risk Category Calculator 0 1 1 1 2   Patient at Risk for Falls Due to  History of fall(s)  History of fall(s) History of fall(s)  Fall risk Follow up  Falls evaluation completed  Falls evaluation completed Falls evaluation completed   Functional Status Survey:    Vitals:   06/21/24 1551  BP: 122/78  Pulse: 60  Resp: 17  Temp: 97.6 F (36.4 C)  SpO2: 95%  Weight: 141 lb 12.8 oz (64.3 kg)  Height: 5' 4 (1.626 m)   Body mass index is 24.34 kg/m. Physical Exam VITALS: T- 97.6, P- 60, BP- 122/78, SaO2- 95% MEASUREMENTS: Weight- 141.8. GENERAL: Alert, cooperative, well developed, no acute distress. HEENT: Normocephalic, normal oropharynx, moist mucous membranes, tympanic membranes normal bilaterally, pupils equal, round, and reactive to light. NECK: Thyroid normal, no cervical adenopathy. CHEST: Clear to auscultation bilaterally, no wheezes, rhonchi, or crackles. CARDIOVASCULAR: Normal heart rate and rhythm, S1 and S2 normal without murmurs. ABDOMEN: Soft, non-tender, non-distended, without organomegaly, normal bowel sounds. EXTREMITIES: No cyanosis or edema. NEUROLOGICAL: Cranial nerves grossly intact, moves all extremities without gross motor or sensory deficit. SKIN: No rash,no lesion or erythema   PSYCHIATRY/BEHAVIORAL: Mood stable    Labs reviewed: Recent Labs    12/23/23 1612  NA 143  K 4.1  CL 110  CO2 21  GLUCOSE 88  BUN 19  CREATININE 0.98*  CALCIUM 9.4   Recent Labs    12/23/23 1612  AST 28  ALT 21  BILITOT 1.0  PROT 6.6   Recent Labs    12/23/23 1612  WBC 4.3  NEUTROABS 2,481  HGB 10.8*  HCT 35.1  MCV 83.6  PLT 151   Lab Results  Component Value Date   TSH 0.91 12/23/2023    No results found for: HGBA1C Lab Results  Component Value Date   CHOL 195 03/08/2022   HDL 59 03/08/2022   LDLCALC 122 (H) 03/08/2022   TRIG 57 03/08/2022   CHOLHDL 3.3 03/08/2022    Significant Diagnostic Results in last 30 days:  No results found.  Assessment/Plan  Falls She has experienced two recent falls, one due to loss of balance, with no injuries reported.  Urinary Incontinence She reports a sensation of a hot, warm rush and inability to stop  urination, indicating urinary incontinence. She uses pull-ups and extra pads for management. - Continue use of pull-ups and extra pads for incontinence management  Hypertension Blood pressure is well-controlled at 122/78 mmHg with current medication regimen. She is on losartan  50 mg once daily. - Continue losartan  50 mg once daily - Check with pharmacy for any needed refills of losartan    Hyperlipidemia  - continue on Omega -3 fatty acid   Anemia Previous hemoglobin levels were 10.8 g/dL and 89.6 g/dL, indicating mild anemia. There is a need to monitor hemoglobin levels to ensure stability. - Order CBC to recheck hemoglobin levels  Arthritis She reports arthritis pain, which is managed with Tylenol  as needed. She does not take Tylenol  daily, only when requested. - Continue Tylenol  as needed for arthritis pain  General Health Maintenance She is up to date on immunizations except for the upcoming flu season. - Administer flu vaccine at least one month before flu season  Follow-up Follow-up plans include monitoring lab results and ensuring medication refills. - Have lab technician draw blood for CBC, cholesterol, and thyroid tests - Ensure pharmacy is contacted for any medication refills   Family/ staff Communication: Reviewed plan of care with patient and daughter verbalized understanding   Labs/tests ordered:  - CBC with Differential/Platelet - CMP with eGFR([facility]) - TSH - Lipid panel   Next Appointment :  Return in about 6 months (around 12/22/2024) for medical mangement of chronic issues.SABRA   Spent 30 minutes of Face to face and non-face to face with patient  >50% time spent counseling; reviewing medical record; tests; labs; documentation and developing future plan of care.   Roxan JAYSON Plough, NP

## 2024-06-22 LAB — CBC WITH DIFFERENTIAL/PLATELET
Absolute Lymphocytes: 1549 {cells}/uL (ref 850–3900)
Absolute Monocytes: 308 {cells}/uL (ref 200–950)
Basophils Absolute: 48 {cells}/uL (ref 0–200)
Basophils Relative: 1.1 %
Eosinophils Absolute: 79 {cells}/uL (ref 15–500)
Eosinophils Relative: 1.8 %
HCT: 33.5 % — ABNORMAL LOW (ref 35.0–45.0)
Hemoglobin: 10.2 g/dL — ABNORMAL LOW (ref 11.7–15.5)
MCH: 25.3 pg — ABNORMAL LOW (ref 27.0–33.0)
MCHC: 30.4 g/dL — ABNORMAL LOW (ref 32.0–36.0)
MCV: 83.1 fL (ref 80.0–100.0)
Monocytes Relative: 7 %
Neutro Abs: 2416 {cells}/uL (ref 1500–7800)
Neutrophils Relative %: 54.9 %
Platelets: 78 Thousand/uL — ABNORMAL LOW (ref 140–400)
RBC: 4.03 Million/uL (ref 3.80–5.10)
RDW: 15.3 % — ABNORMAL HIGH (ref 11.0–15.0)
Total Lymphocyte: 35.2 %
WBC: 4.4 Thousand/uL (ref 3.8–10.8)

## 2024-06-22 LAB — LIPID PANEL
Cholesterol: 190 mg/dL (ref <200)
HDL: 57 mg/dL (ref >=50)
LDL Cholesterol (Calc): 118 mg/dL — ABNORMAL HIGH
Non-HDL Cholesterol (Calc): 133 mg/dL — ABNORMAL HIGH (ref <130)
Total CHOL/HDL Ratio: 3.3 (calc) (ref <5.0)
Triglycerides: 54 mg/dL (ref <150)

## 2024-06-22 LAB — COMPREHENSIVE METABOLIC PANEL WITH GFR
AG Ratio: 1.3 (calc) (ref 1.0–2.5)
ALT: 20 U/L (ref 6–29)
AST: 23 U/L (ref 10–35)
Albumin: 3.7 g/dL (ref 3.6–5.1)
Alkaline phosphatase (APISO): 65 U/L (ref 37–153)
BUN: 19 mg/dL (ref 7–25)
CO2: 23 mmol/L (ref 20–32)
Calcium: 9.3 mg/dL (ref 8.6–10.4)
Chloride: 112 mmol/L — ABNORMAL HIGH (ref 98–110)
Creat: 0.93 mg/dL (ref 0.60–0.95)
Globulin: 2.8 g/dL (ref 1.9–3.7)
Glucose, Bld: 89 mg/dL (ref 65–99)
Potassium: 4 mmol/L (ref 3.5–5.3)
Sodium: 144 mmol/L (ref 135–146)
Total Bilirubin: 0.8 mg/dL (ref 0.2–1.2)
Total Protein: 6.5 g/dL (ref 6.1–8.1)
eGFR: 58 mL/min/1.73m2 — ABNORMAL LOW (ref >=60)

## 2024-06-22 LAB — TSH: TSH: 0.62 m[IU]/L (ref 0.40–4.50)

## 2024-06-28 ENCOUNTER — Ambulatory Visit: Payer: Self-pay | Admitting: Family

## 2024-07-17 ENCOUNTER — Emergency Department (HOSPITAL_COMMUNITY)
Admission: EM | Admit: 2024-07-17 | Discharge: 2024-07-17 | Disposition: A | Discharge status: Home / Self Care | Attending: Student | Admitting: Student

## 2024-07-17 ENCOUNTER — Emergency Department (HOSPITAL_COMMUNITY)

## 2024-07-17 DIAGNOSIS — Z043 Encounter for examination and observation following other accident: Secondary | ICD-10-CM | POA: Diagnosis not present

## 2024-07-17 DIAGNOSIS — I700 Atherosclerosis of aorta: Secondary | ICD-10-CM | POA: Diagnosis not present

## 2024-07-17 DIAGNOSIS — N3001 Acute cystitis with hematuria: Secondary | ICD-10-CM | POA: Insufficient documentation

## 2024-07-17 DIAGNOSIS — S0990XA Unspecified injury of head, initial encounter: Secondary | ICD-10-CM | POA: Diagnosis not present

## 2024-07-17 DIAGNOSIS — Z79899 Other long term (current) drug therapy: Secondary | ICD-10-CM | POA: Diagnosis not present

## 2024-07-17 DIAGNOSIS — M47812 Spondylosis without myelopathy or radiculopathy, cervical region: Secondary | ICD-10-CM | POA: Diagnosis not present

## 2024-07-17 DIAGNOSIS — I10 Essential (primary) hypertension: Secondary | ICD-10-CM | POA: Diagnosis not present

## 2024-07-17 DIAGNOSIS — R55 Syncope and collapse: Secondary | ICD-10-CM | POA: Diagnosis not present

## 2024-07-17 DIAGNOSIS — I771 Stricture of artery: Secondary | ICD-10-CM | POA: Diagnosis not present

## 2024-07-17 DIAGNOSIS — W19XXXA Unspecified fall, initial encounter: Secondary | ICD-10-CM | POA: Diagnosis not present

## 2024-07-17 DIAGNOSIS — R531 Weakness: Secondary | ICD-10-CM | POA: Diagnosis not present

## 2024-07-17 LAB — COMPREHENSIVE METABOLIC PANEL WITH GFR
ALT: 15 U/L (ref 0–44)
AST: 25 U/L (ref 15–41)
Albumin: 3.1 g/dL — ABNORMAL LOW (ref 3.5–5.0)
Alkaline Phosphatase: 53 U/L (ref 38–126)
Anion gap: 8 (ref 5–15)
BUN: 18 mg/dL (ref 8–23)
CO2: 23 mmol/L (ref 22–32)
Calcium: 9.3 mg/dL (ref 8.9–10.3)
Chloride: 112 mmol/L — ABNORMAL HIGH (ref 98–111)
Creatinine, Ser: 0.93 mg/dL (ref 0.44–1.00)
GFR, Estimated: 58 mL/min — ABNORMAL LOW (ref >60)
Glucose, Bld: 90 mg/dL (ref 70–99)
Potassium: 3.6 mmol/L (ref 3.5–5.1)
Sodium: 143 mmol/L (ref 135–145)
Total Bilirubin: 0.9 mg/dL (ref 0.0–1.2)
Total Protein: 6.2 g/dL — ABNORMAL LOW (ref 6.5–8.1)

## 2024-07-17 LAB — URINALYSIS, ROUTINE W REFLEX MICROSCOPIC
Bilirubin Urine: NEGATIVE
Glucose, UA: NEGATIVE mg/dL
Ketones, ur: NEGATIVE mg/dL
Nitrite: NEGATIVE
Protein, ur: NEGATIVE mg/dL
Specific Gravity, Urine: 1.011 (ref 1.005–1.030)
WBC, UA: >50 WBC/hpf (ref 0–5)
pH: 7 (ref 5.0–8.0)

## 2024-07-17 LAB — CBC WITH DIFFERENTIAL/PLATELET
Abs Immature Granulocytes: 0.01 K/uL (ref 0.00–0.07)
Basophils Absolute: 0.1 K/uL (ref 0.0–0.1)
Basophils Relative: 1 %
Eosinophils Absolute: 0.1 K/uL (ref 0.0–0.5)
Eosinophils Relative: 2 %
HCT: 31 % — ABNORMAL LOW (ref 36.0–46.0)
Hemoglobin: 9.6 g/dL — ABNORMAL LOW (ref 12.0–15.0)
Immature Granulocytes: 0 %
Lymphocytes Relative: 35 %
Lymphs Abs: 1.6 K/uL (ref 0.7–4.0)
MCH: 25.7 pg — ABNORMAL LOW (ref 26.0–34.0)
MCHC: 31 g/dL (ref 30.0–36.0)
MCV: 82.9 fL (ref 80.0–100.0)
Monocytes Absolute: 0.4 K/uL (ref 0.1–1.0)
Monocytes Relative: 9 %
Neutro Abs: 2.3 K/uL (ref 1.7–7.7)
Neutrophils Relative %: 53 %
Platelets: 159 K/uL (ref 150–400)
RBC: 3.74 MIL/uL — ABNORMAL LOW (ref 3.87–5.11)
RDW: 15.9 % — ABNORMAL HIGH (ref 11.5–15.5)
WBC: 4.5 K/uL (ref 4.0–10.5)
nRBC: 0 % (ref 0.0–0.2)

## 2024-07-17 LAB — TROPONIN I (HIGH SENSITIVITY)
Troponin I (High Sensitivity): 9 ng/L (ref <18)
Troponin I (High Sensitivity): 9 ng/L (ref <18)

## 2024-07-17 LAB — CK: Total CK: 147 U/L (ref 38–234)

## 2024-07-17 MED ORDER — FOSFOMYCIN TROMETHAMINE 3 G PO PACK
3.0000 g | PACK | Freq: Once | ORAL | Status: AC
  Administered 2024-07-17: 3 g via ORAL
  Filled 2024-07-17: qty 3

## 2024-07-17 NOTE — ED Provider Notes (Signed)
 [place] EMERGENCY DEPARTMENT AT [facility] Provider Note   CSN: [id number] Arrival date & time: 07/17/24  1614     Patient presents with: Tracy Faulkner is a 89 y.o. female.   89 y.o female with a PMH HTN, Ataxia, presents to the ED via EMS s/p unwitnessed fall. Patient was at home when she likely suffered an fall.  She was last seen this morning by her daughter before she left for work around 8 AM.  Found by EMS today they are unsure how long she was down for.  Patient is at her baseline per family alert to self.  Patient is not complaining of any pain at this time.  She does show some grimace to the left side to her side of her head worsened with palpation.  According to records she is not anticoagulated.  Level 5 caveat due to mental status.  The history is provided by the patient.       Prior to Admission medications   Medication Sig Start Date End Date Taking? Authorizing Provider  ACETAMINOPHEN  ER PO Take 500 mg by mouth daily as needed for pain.    [provider]  cycloSPORINE  (RESTASIS ) 0.05 % ophthalmic emulsion Place 1 drop into both eyes daily. 09/24/22   Ngetich, Dinah C, NP  losartan  (COZAAR ) 50 MG tablet TAKE 1 TABLET BY MOUTH EVERY DAY 07/12/23   Ngetich, Dinah C, NP  Multiple Vitamin (MULTIVITAMIN) tablet Take 1 tablet by mouth daily.    [provider]  Omega-3 Fatty Acids (FISH OIL PO) Take 1 capsule by mouth daily.    [provider]  OVER THE COUNTER MEDICATION Take 1 capsule by mouth daily. Gluco Gel Joint & Cartilage Support    [provider]  OVER THE COUNTER MEDICATION Apply 1 application  topically daily as needed (pain relief). Youngevity Pain Cream    [provider]  polyethylene glycol (MIRALAX  / GLYCOLAX ) 17 g packet Take 17 g by mouth daily as needed for mild constipation. Patient not taking: Reported on 06/21/2024 12/15/22   Amin, Sumayya, MD    Allergies: Ace inhibitors, Lortab  [hydrocodone -acetaminophen ], Remeron  [mirtazapine ], and Penicillins    Review of Systems  Unable to perform ROS: Dementia  Constitutional:  Negative for fever.  Respiratory:  Negative for shortness of breath.   Cardiovascular:  Negative for chest pain.  All other systems reviewed and are negative.   Updated Vital Signs BP (!) 176/93   Pulse 73   Temp 98.2 F (36.8 C) (Oral)   Resp 20   SpO2 100%   Physical Exam Vitals and nursing note reviewed.  Constitutional:      Appearance: Normal appearance.  HENT:     Head: Normocephalic.     Comments: Small palpable goose egg to the left side of her head Neurological:     Mental Status: She is alert.     (all labs ordered are listed, but only abnormal results are displayed) Labs Reviewed  CBC WITH DIFFERENTIAL/PLATELET - Abnormal; Notable for the following components:      Result Value   RBC 3.74 (*)    Hemoglobin 9.6 (*)    HCT 31.0 (*)    MCH 25.7 (*)    RDW 15.9 (*)    All other components within normal limits  COMPREHENSIVE METABOLIC PANEL WITH GFR - Abnormal; Notable for the following components:   Chloride 112 (*)    Total Protein 6.2 (*)    Albumin  3.1 (*)    GFR, Estimated 58 (*)    All other components within normal limits  URINALYSIS, ROUTINE W REFLEX MICROSCOPIC - Abnormal; Notable for the following components:   APPearance CLOUDY (*)    Hgb urine dipstick MODERATE (*)    Leukocytes,Ua LARGE (*)    Bacteria, UA MANY (*)    All other components within normal limits  URINE CULTURE  CK  TROPONIN I (HIGH SENSITIVITY)  TROPONIN I (HIGH SENSITIVITY)    EKG: EKG Interpretation Date/Time:  Tuesday July 17 2024 16:36:12 EDT Ventricular Rate:  70 PR Interval:    QRS Duration:  96 QT Interval:  413 QTC Calculation: 446 R Axis:   48  Text Interpretation: Normal sinus rhythm Ventricular premature complex Confirmed by Kommor, Madison ([id number]) on 07/17/2024 9:01:39 PM  Radiology: CT Cervical Spine Wo  Contrast Result Date: 07/17/2024 CLINICAL DATA:  Un witnessed fall, unknown down time EXAM: CT CERVICAL SPINE WITHOUT CONTRAST TECHNIQUE: Multidetector CT imaging of the cervical spine was performed without intravenous contrast. Multiplanar CT image reconstructions were also generated. RADIATION DOSE REDUCTION: This exam was performed according to the departmental dose-optimization program which includes automated exposure control, adjustment of the mA and/or kV according to patient size and/or use of iterative reconstruction technique. COMPARISON:  03/27/2023 FINDINGS: Alignment: Straightening of the cervical spine is likely positional and related to multilevel degenerative changes. Skull base and vertebrae: No acute fracture. No primary bone lesion or focal pathologic process. Soft tissues and spinal canal: No prevertebral fluid or swelling. No visible canal hematoma. Disc levels: Stable diffuse multilevel spondylosis and facet hypertrophy, with bony fusion across the C3-4 and C4-5 facets on the left and the C4-5 facet on the right. Upper chest: Airway is patent.  Lung apices are clear. Other: Reconstructed images demonstrate no additional findings. IMPRESSION: 1. No acute cervical spine fracture. 2. Stable extensive multilevel degenerative changes. Electronically Signed   By: Ozell Daring M.D.   On: 07/17/2024 17:20   DG Chest 2 View Result Date: 07/17/2024 CLINICAL DATA:  Syncope. EXAM: CHEST - 2 VIEW COMPARISON:  Radiographs 06/11/2023 and 11/13/2016. FINDINGS: The heart size and mediastinal contours are stable with aortic atherosclerosis and tortuosity. The lungs appear clear. No pleural effusion or pneumothorax. There are degenerative changes throughout the spine without evidence of acute osseous abnormality. Multiple surgical clips are present in the upper abdomen. IMPRESSION: Stable chest. No evidence of acute cardiopulmonary process. Aortic atherosclerosis. Electronically Signed   By: Elsie Perone  M.D.   On: 07/17/2024 17:14   CT HEAD WO CONTRAST ( ) Result Date: 07/17/2024 CLINICAL DATA:  Un witnessed fall, unknown down time EXAM: CT HEAD WITHOUT CONTRAST TECHNIQUE: Contiguous axial images were obtained from the base of the skull through the vertex without intravenous contrast. RADIATION DOSE REDUCTION: This exam was performed according to the departmental dose-optimization program which includes automated exposure control, adjustment of the mA and/or kV according to patient size and/or use of iterative reconstruction technique. COMPARISON:  06/11/2023 FINDINGS: Brain: No acute infarct or hemorrhage. Lateral ventricles and midline structures are stable. No acute extra-axial fluid collections. No mass effect. Stable cerebral cortical atrophy. Vascular: No hyperdense vessel or unexpected calcification. Skull: Normal. Negative for fracture or focal lesion. Sinuses/Orbits: No acute finding. Other: None. IMPRESSION: 1. Stable head CT, no acute intracranial process. Electronically Signed   By: Ozell Daring M.D.   On: 07/17/2024 17:06     Procedures   Medications Ordered in the ED  fosfomycin (MONUROL ) packet 3 g (  3 g Oral Given 07/17/24 2238)    Clinical Course as of 07/17/24 2312  Tue Jul 17, 2024  2204 Leukocytes,Ua(!): LARGE [JS]  2204 Hgb urine dipstickROLLEN): MODERATE [JS]  2204 Bacteria, UA(!): MANY [JS]  2204 WBC, UA: >50 [JS]    Clinical Course User Index [JS] Alianis Trimmer, PA-C                                 Medical Decision Making Amount and/or Complexity of Data Reviewed Labs: ordered. Decision-making details documented in ED Course. Radiology: ordered.  Risk Prescription drug management.   This patient presents to the ED for concern of syncope versus fall, this involves a number of treatment options, and is a complaint that carries with it a high risk of complications and morbidity.   Co morbidities: Discussed in HPI   Brief History:  See HPI.   EMR reviewed  including pt PMHx, past surgical history and past visits to ER.   See HPI for more details   Lab Tests:  I ordered and independently interpreted labs.  The pertinent results include:    I personally reviewed all laboratory work and imaging. Metabolic panel without any acute abnormality specifically kidney function within normal limits and no significant electrolyte abnormalities. CBC without leukocytosis or significant anemia.  Imaging Studies:  CT head, CT cervical spine without any acute finding. X-ray of her chest without any acute findings.   Cardiac Monitoring:  The patient was maintained on a cardiac monitor.  I personally viewed and interpreted the cardiac monitored which showed an underlying rhythm of: NSR 70 EKG non-ischemic   Medicines ordered:  I ordered medication including Fosfomycin  for Urinary tract infection.  Reevaluation of the patient after these medicines showed that the patient stayed the same I have reviewed the patients home medicines and have made adjustments as needed  Reevaluation:  After the interventions noted above I re-evaluated patient and found that they have :stayed the same  Social Determinants of Health:  The patient's social determinants of health were a factor in the care of this patient  Problem List / ED Course:  Patient presents to the ED with a chief complaint of unwitnessed fall which occurred today.  Last seen normal by her daughter this morning when she left at 8 AM.  According to her when she returned home patient was laying on the ground by her chair.  She does have some mental decline but has not been diagnosed with dementia yet.  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  Patient tells me that she fell and bumped the left side of her head, no palpable goose egg noted.  CT head, cervical spine are within normal limits.  Seeing as fall was unwitnessed, will treat as syncope versus mechanical fall. CMP with no electrolyte  derangement, current levels within normal limits per LFTs are within normal limits.  CBC with no leukocytosis, hemoglobin is within normal limits.  UA was obtained via In-N-Out cath, many bacteria, greater than 50 white blood cell count, moderate hemoglobin and large leukocytes will send this for culture.  I have reviewed she does have prior urine cultures positive with Klebsiella pneumonia.  She was treated with the 1 dose of fosfomycin, she does not have any CVA tenderness, no fever, do not suspect urosepsis at this time.  Did discuss this patient with my attending Dr. Albertina who agrees with plan and management.  I discussed all testing, laboratory results with daughter Madelin, who is agreeable of plan and treatment. Hemodynamically stable for discharge.   Dispostion:  After consideration of the diagnostic results and the patients response to treatment, I feel that the patent would benefit from follow up with primary care physician.    Portions of this note were generated with Scientist, clinical (histocompatibility and immunogenetics). Dictation errors may occur despite best attempts at proofreading.   Final diagnoses:  Fall, initial encounter  Acute cystitis with hematuria    ED Discharge Orders     None          Jolan Upchurch, PA-C 07/17/24 2312    Albertina Dixon, MD 07/18/24 [phone]

## 2024-07-17 NOTE — ED Notes (Signed)
Called daughter , no answer

## 2024-07-17 NOTE — ED Notes (Signed)
 Daughter is here for pickup

## 2024-07-17 NOTE — Discharge Instructions (Addendum)
 Your urinalysis showed a urinary tract infection. You were treated while in the emergency room, and will not need further treatment.

## 2024-07-17 NOTE — ED Notes (Signed)
 In and out cath completed, pt brief and bedding changed. Pt repositioned in bed.

## 2024-07-17 NOTE — ED Notes (Signed)
 This RN and another unable to find a vein for IV access, phlebotomy to draw labs.

## 2024-07-17 NOTE — ED Triage Notes (Signed)
 Pt bibems from home. Unwitnessed fall, unknown downtime, unknown LOC. No deformities or grimace when palpated by EMS. Denies any pain. Aox1 to self- at baseline.

## 2024-07-17 NOTE — ED Notes (Signed)
 Patient was incontinent to urine. Urine has a foul odor. Patient was cleaned. Soiled liens were removed and new linens were placed on the bed. Patient was placed in a new gown. Also called Daughter Madelin- [phone] to give her an update.

## 2024-07-20 ENCOUNTER — Encounter: Admitting: Family

## 2024-07-20 LAB — URINE CULTURE: Culture: >=100000 — AB

## 2024-07-21 ENCOUNTER — Telehealth (HOSPITAL_BASED_OUTPATIENT_CLINIC_OR_DEPARTMENT_OTHER): Payer: Self-pay

## 2024-07-21 NOTE — Telephone Encounter (Signed)
 Post ED Visit - Positive Culture Follow-up  Culture report reviewed by antimicrobial stewardship pharmacist: [facility] [x]  Dorn Poot, Pharm.D. []  Venetia Gully, Pharm.D., BCPS AQ-ID []  Garrel Crews, Pharm.D., BCPS []  Almarie Lunger, [address].D., BCPS []  [place], [place].D., BCPS, AAHIVP []  Rosaline Bihari, Pharm.D., BCPS, AAHIVP []  Vernell Meier, PharmD, BCPS []  Latanya Hint, PharmD, BCPS []  Donald Medley, PharmD, BCPS []  Rocky Bold, PharmD []  Dorothyann Alert, PharmD, BCPS []  Morene Babe, PharmD  [facility] []  Rosaline Edison, PharmD []  Romona Bliss, PharmD []  Dolphus Roller, PharmD []  Veva Seip, Rph []  Vernell Daunt) Leonce, PharmD []  Eva Allis, PharmD []  Rosaline Millet, PharmD []  Iantha Batch, PharmD []  Arvin Gauss, PharmD []  Wanda Hasting, PharmD []  Ronal Rav, PharmD []  Rocky Slade, PharmD []  Bard Jeans, PharmD   Positive urine culture Treated with Fosfomycin, organism sensitive to the same and no further patient follow-up is required at this time.  Ruth Camelia Elbe 07/21/2024, 11:08 AM

## 2024-07-24 NOTE — Progress Notes (Signed)
   This encounter was created in error - please disregard. No show

## 2024-07-26 ENCOUNTER — Encounter: Admitting: Family

## 2024-07-27 NOTE — Progress Notes (Signed)
   This encounter was created in error - please disregard. No show

## 2024-09-11 ENCOUNTER — Encounter: Payer: Medicare PPO | Admitting: Family

## 2024-09-19 ENCOUNTER — Encounter: Admitting: Family

## 2024-09-20 ENCOUNTER — Encounter: Admitting: Family

## 2024-09-26 NOTE — Progress Notes (Signed)
   This encounter was created in error - please disregard. No show

## 2024-10-02 NOTE — Progress Notes (Signed)
This encounter was created in error - please disregard. ?No show This encounter was created in error - please disregard. ?

## 2024-12-10 ENCOUNTER — Ambulatory Visit: Admitting: Family

## 2024-12-10 ENCOUNTER — Encounter: Payer: Self-pay | Admitting: Family

## 2024-12-10 VITALS — BP 150/90 | HR 66 | Temp 97.7°F | Resp 17 | Ht 64.0 in | Wt 138.0 lb

## 2024-12-10 DIAGNOSIS — R296 Repeated falls: Secondary | ICD-10-CM | POA: Diagnosis not present

## 2024-12-10 DIAGNOSIS — N390 Urinary tract infection, site not specified: Secondary | ICD-10-CM | POA: Diagnosis not present

## 2024-12-10 DIAGNOSIS — R634 Abnormal weight loss: Secondary | ICD-10-CM | POA: Diagnosis not present

## 2024-12-10 LAB — POC URINALSYSI DIPSTICK (AUTOMATED)
Bilirubin, UA: NEGATIVE
Blood, UA: NEGATIVE
Glucose, UA: NEGATIVE
Ketones, UA: NEGATIVE
Nitrite, UA: NEGATIVE
Protein, UA: POSITIVE — AB
Spec Grav, UA: 1.02
Urobilinogen, UA: 0.2 U/dL
pH, UA: 6

## 2024-12-10 NOTE — Progress Notes (Signed)
 "  Provider: Romello Hoehn FNP-C  Tracy Faulkner, Roxan BROCKS, NP  Patient Care Team: Dorse Locy, Roxan BROCKS, NP as PCP - General (Family Medicine) Claudene Victory ORN, MD (Inactive) as PCP - Cardiology (Cardiology) Claudene Victory ORN, MD (Inactive) as Consulting Physician (Cardiology) Ermelinda Eckert, Roxan BROCKS, NP (Family Medicine)  Extended Emergency Contact Information Primary Emergency Contact: Rudine Milling  [place] Home Phone: [phone] Mobile Phone: [phone] Relation: Daughter  Code Status: Full code Goals of care: Advanced Directive information    12/10/2024    2:48 PM  Advanced Directives  Does Patient Have a Medical Advance Directive? No  Would patient like information on creating a medical advance directive? No - Patient declined     Chief Complaint  Patient presents with   Possible UTI    History of Present Illness   Tracy Faulkner is a 89 year old female who presents with urinary symptoms and recent falls.  She experiences urinary symptoms, including mild pain and a burning sensation during urination. A urine specimen is needed for further evaluation.  She has had a couple of falls since her last visit, one occurring when she slipped off the bed and another in the great room. No injuries were reported, but assistance was needed to help her up. Her husband notes occasional confusion, which may or may not be related to her urinary symptoms.  Her weight has decreased from 141 pounds to 138 pounds since her last visit. Despite a 'pretty good' appetite, she is not taking any nutritional supplements.  She denies any significant pain and uses Tylenol  infrequently, only once or twice a week if needed. Her bowel movements are firm but not difficult to pass, and she reports they are 'doing better than it used to.'   Past Medical History:  Diagnosis Date   Anemia    Arthritis    Ataxia    Back problem    Bowel trouble    Breast cancer (HCC)    Carpal tunnel syndrome     Constipation    Disturbance, sleep    Dyspnea    increased exertion    Frequent headaches    GI bleed    History of mammogram 2000   History of urinary tract infection    Hypertension    Obesity    Pain in both knees    Sepsis (HCC)    Urinary incontinence    Past Surgical History:  Procedure Laterality Date   BREAST SURGERY     for cancer-right breast   COLON SURGERY     secondary to bowel blockages   EYE SURGERY     cataract surgery bilateral    gastric stapling     KNEE ARTHROPLASTY Left 10/07/2016   Procedure: LEFT TOTAL KNEE ARTHROPLASTY WITH COMPUTER NAVIGATION;  Surgeon: Redell Shoals, MD;  Location: [facility];  Service: Orthopedics;  Laterality: Left;   ORIF FEMUR FRACTURE Right 12/13/2022   Procedure: OPEN REDUCTION INTERNAL FIXATION (ORIF) DISTAL FEMUR FRACTURE;  Surgeon: Kendal Franky SQUIBB, MD;  Location: [facility];  Service: Orthopedics;  Laterality: Right;    Allergies[1]  Outpatient Encounter Medications as of 12/10/2024  Medication Sig   ACETAMINOPHEN  ER PO Take 500 mg by mouth daily as needed for pain.   cycloSPORINE  (RESTASIS ) 0.05 % ophthalmic emulsion Place 1 drop into both eyes daily.   losartan  (COZAAR ) 50 MG tablet TAKE 1 TABLET BY MOUTH EVERY DAY   Multiple Vitamin (MULTIVITAMIN) tablet Take 1 tablet by mouth daily.   Omega-3 Fatty  Acids (FISH OIL PO) Take 1 capsule by mouth daily.   OVER THE COUNTER MEDICATION Take 1 capsule by mouth daily. Gluco Gel Joint & Cartilage Support   OVER THE COUNTER MEDICATION Apply 1 application  topically daily as needed (pain relief). Youngevity Pain Cream   polyethylene glycol (MIRALAX  / GLYCOLAX ) 17 g packet Take 17 g by mouth daily as needed for mild constipation. (Patient not taking: Reported on 12/10/2024)   No facility-administered encounter medications on file as of 12/10/2024.    Review of Systems  Constitutional:  Negative for appetite change, chills, fatigue, fever and unexpected weight change.  HENT:  Negative for  congestion, ear discharge, ear pain, hearing loss, nosebleeds, postnasal drip, rhinorrhea, sinus pressure, sinus pain, sneezing, sore throat, tinnitus and trouble swallowing.   Respiratory:  Negative for cough, chest tightness, shortness of breath and wheezing.   Cardiovascular:  Negative for chest pain, palpitations and leg swelling.  Gastrointestinal:  Negative for abdominal distention, abdominal pain, blood in stool, constipation, diarrhea, nausea and vomiting.  Endocrine: Negative for cold intolerance, heat intolerance, polydipsia, polyphagia and polyuria.  Genitourinary:  Positive for dysuria. Negative for difficulty urinating, flank pain, frequency and urgency.  Musculoskeletal:  Positive for gait problem. Negative for arthralgias, back pain, joint swelling, myalgias, neck pain and neck stiffness.  Neurological:  Negative for dizziness, weakness, light-headedness and headaches.  Psychiatric/Behavioral:  Negative for agitation, behavioral problems, confusion, hallucinations, self-injury, sleep disturbance and suicidal ideas. The patient is not nervous/anxious.     Immunization History  Administered Date(s) Administered   Fluad Quad(high Dose 65+) 09/07/2021, 09/24/2022   Fluad Trivalent(High Dose 65+) 09/06/2023   INFLUENZA, HIGH DOSE SEASONAL PF 08/13/2019   Influenza,inj,quad, With Preservative 09/15/2017   Janssen (J&J) SARS-COV-2 Vaccination 02/03/2020   Pneumococcal Conjugate-13 08/18/2015   Pneumococcal Polysaccharide-23 12/13/2017   Tdap 10/11/2017   Zoster Recombinant(Shingrix) 10/11/2017, 12/13/2017   Zoster, Live 10/12/2017   Pertinent  Health Maintenance Due  Topic Date Due   Mammogram  11/18/2016   Influenza Vaccine  06/29/2024   Bone Density Scan  Completed      02/28/2024   10:51 AM 06/21/2024    3:49 PM 10/11/2024    1:14 AM 10/28/2024    9:24 PM 12/10/2024    2:47 PM  Fall Risk  Falls in the past year? 1 1 -- 0 1  Was there an injury with Fall? 0  0    0  Fall  Risk Category Calculator 1 2   2   Patient at Risk for Falls Due to History of fall(s) History of fall(s) Other (Comment) Other (Comment) History of fall(s)  Patient at Risk for Falls Due to - Comments   no show no show   Fall risk Follow up Falls evaluation completed Falls evaluation completed Follow up appointment Follow up appointment Falls evaluation completed  Fall risk Follow up - Comments   no show       Data saved with a previous flowsheet row definition   Functional Status Survey:    Vitals:   12/10/24 1536  BP: (!) 150/90  Pulse: 66  Resp: 17  Temp: 97.7 F (36.5 C)  SpO2: 95%  Weight: 138 lb (62.6 kg)  Height: 5' 4 (1.626 m)   Body mass index is 23.69 kg/m. Physical Exam  VITALS: P- 66, BP- 150/90, SaO2- 95% MEASUREMENTS: Weight- 138. GENERAL: Alert, cooperative, well developed, no acute distress HEENT: Normocephalic, normal oropharynx, moist mucous membranes, ears normal, no infection, nose normal, oral cavity normal CHEST:  Clear to auscultation bilaterally, no wheezes, rhonchi, or crackles CARDIOVASCULAR: Normal heart rate and rhythm, S1 and S2 normal without murmurs ABDOMEN: Soft, non-tender, non-distended, without organomegaly, normal bowel sounds, abdomen normal EXTREMITIES: No cyanosis or edema, extremities normal NEUROLOGICAL: Cranial nerves grossly intact, moves all extremities without gross motor or sensory deficit   Labs reviewed: Recent Labs    12/23/23 1612 06/21/24 1620 07/17/24 1823  NA 143 144 143  K 4.1 4.0 3.6  CL 110 112* 112*  CO2 21 23 23   GLUCOSE 88 89 90  BUN 19 19 18   CREATININE 0.98* 0.93 0.93  CALCIUM 9.4 9.3 9.3   Recent Labs    12/23/23 1612 06/21/24 1620 07/17/24 1823  AST 28 23 25   ALT 21 20 15   ALKPHOS  --   --  53  BILITOT 1.0 0.8 0.9  PROT 6.6 6.5 6.2*  ALBUMIN  --   --  3.1*   Recent Labs    12/23/23 1612 06/21/24 1620 07/17/24 1823  WBC 4.3 4.4 4.5  NEUTROABS 2,481 2,416 2.3  HGB 10.8* 10.2* 9.6*  HCT  35.1 33.5* 31.0*  MCV 83.6 83.1 82.9  PLT 151 78* 159   Lab Results  Component Value Date   TSH 0.62 06/21/2024   No results found for: HGBA1C Lab Results  Component Value Date   CHOL 190 06/21/2024   HDL 57 06/21/2024   LDLCALC 118 (H) 06/21/2024   TRIG 54 06/21/2024   CHOLHDL 3.3 06/21/2024    Significant Diagnostic Results in last 30 days:  No results found.  Assessment/Plan  Urinary tract infection Possible urinary tract infection indicated by dysuria and confusion. Urine specimen collected for culture to confirm diagnosis. - Sent urine specimen for culture - Treated for urinary tract infection as indicated  Unintentional weight loss Weight decreased from 141 lbs to 138 lbs since last visit. Appetite is good, but no nutritional supplements are being used. Weight loss may contribute to increased fall risk due to muscle weakness. - Recommended adding nutritional supplements to maintain weight  History of falls Recent falls reported, including one incident where she slipped off the bed and another in the great room. No injuries reported, but falls may be related to weight loss and muscle weakness. - Encouraged use of compression socks to prevent falls   Family/ staff Communication: Reviewed plan of care with patient and daughter verbalized understanding  Labs/tests ordered:  - Urine Culture; Future - POC Urinalysis Dipstick   Next Appointment: Return if symptoms worsen or fail to improve.   Total time: 20 minutes. Greater than 50% of total time spent doing patient education regarding urinary tract infection, abnormal weight loss, fall episode and health maintenance including symptom/medication management.   Roxan BROCKS Kiernan Atkerson, NP     [1]  Allergies Allergen Reactions   Ace Inhibitors Cough   Lortab [Hydrocodone -Acetaminophen ] Other (See Comments)    Bad dreams   Remeron  [Mirtazapine ] Other (See Comments)    Hallucinations (can take 1/2 tablet--7.5 mg), not 15  mg   Penicillins Rash and Other (See Comments)    Throat swelling, Has patient had a PCN reaction causing immediate rash, facial/tongue/throat swelling, SOB or lightheadedness with hypotension: Yes Has patient had a PCN reaction causing severe rash involving mucus membranes or skin necrosis: No Has patient had a PCN reaction that required hospitalization No Has patient had a PCN reaction occurring within the last 10 years: No If all of the above answers are NO, then may proceed with Cephalosporin  use.    "

## 2024-12-11 LAB — URINE CULTURE
MICRO NUMBER:: 17456183
Result:: NO GROWTH
SPECIMEN QUALITY:: ADEQUATE

## 2024-12-12 ENCOUNTER — Ambulatory Visit: Payer: Self-pay | Admitting: Family

## 2024-12-27 ENCOUNTER — Encounter: Payer: Self-pay | Admitting: Family

## 2024-12-31 NOTE — Progress Notes (Signed)
   This encounter was created in error - please disregard. No show
